# Patient Record
Sex: Male | Born: 1935 | Race: White | Hispanic: No | Marital: Single | Smoking: Current every day smoker
Health system: Southern US, Community
[De-identification: ages and names within clinical notes are randomized; demographics above are authoritative.]

## PROBLEM LIST (undated history)

## (undated) DIAGNOSIS — I2510 Atherosclerotic heart disease of native coronary artery without angina pectoris: Secondary | ICD-10-CM

## (undated) DIAGNOSIS — R0602 Shortness of breath: Secondary | ICD-10-CM

## (undated) DIAGNOSIS — I4891 Unspecified atrial fibrillation: Secondary | ICD-10-CM

## (undated) DIAGNOSIS — I493 Ventricular premature depolarization: Secondary | ICD-10-CM

## (undated) DIAGNOSIS — G459 Transient cerebral ischemic attack, unspecified: Secondary | ICD-10-CM

## (undated) DIAGNOSIS — E785 Hyperlipidemia, unspecified: Secondary | ICD-10-CM

## (undated) DIAGNOSIS — N189 Chronic kidney disease, unspecified: Secondary | ICD-10-CM

## (undated) DIAGNOSIS — Z9981 Dependence on supplemental oxygen: Secondary | ICD-10-CM

## (undated) DIAGNOSIS — Z789 Other specified health status: Secondary | ICD-10-CM

## (undated) DIAGNOSIS — I255 Ischemic cardiomyopathy: Secondary | ICD-10-CM

## (undated) DIAGNOSIS — I10 Essential (primary) hypertension: Secondary | ICD-10-CM

## (undated) DIAGNOSIS — I5030 Unspecified diastolic (congestive) heart failure: Secondary | ICD-10-CM

## (undated) HISTORY — DX: Other specified health status: Z78.9

## (undated) HISTORY — PX: CHOLECYSTECTOMY: SHX55

## (undated) HISTORY — DX: Atherosclerotic heart disease of native coronary artery without angina pectoris: I25.10

## (undated) HISTORY — DX: Transient cerebral ischemic attack, unspecified: G45.9

## (undated) HISTORY — DX: Essential (primary) hypertension: I10

## (undated) HISTORY — PX: TONSILLECTOMY: SUR1361

## (undated) HISTORY — DX: Unspecified atrial fibrillation: I48.91

## (undated) HISTORY — DX: Ventricular premature depolarization: I49.3

## (undated) HISTORY — DX: Unspecified diastolic (congestive) heart failure: I50.30

## (undated) HISTORY — DX: Ischemic cardiomyopathy: I25.5

## (undated) HISTORY — DX: Chronic kidney disease, unspecified: N18.9

## (undated) HISTORY — DX: Hyperlipidemia, unspecified: E78.5

---

## 2010-11-03 ENCOUNTER — Observation Stay (HOSPITAL_COMMUNITY)
Admission: EM | Admit: 2010-11-03 | Discharge: 2010-11-05 | Payer: Self-pay | Source: Home / Self Care | Attending: Internal Medicine | Admitting: Internal Medicine

## 2010-11-04 ENCOUNTER — Encounter (INDEPENDENT_AMBULATORY_CARE_PROVIDER_SITE_OTHER): Payer: Self-pay | Admitting: Internal Medicine

## 2010-11-05 ENCOUNTER — Encounter: Payer: Self-pay | Admitting: Internal Medicine

## 2010-11-12 ENCOUNTER — Telehealth (INDEPENDENT_AMBULATORY_CARE_PROVIDER_SITE_OTHER): Payer: Self-pay | Admitting: Radiology

## 2010-11-15 ENCOUNTER — Ambulatory Visit: Payer: Self-pay

## 2010-11-15 ENCOUNTER — Ambulatory Visit: Payer: Self-pay | Admitting: Internal Medicine

## 2010-11-15 ENCOUNTER — Encounter: Payer: Self-pay | Admitting: Cardiology

## 2010-11-15 ENCOUNTER — Encounter (HOSPITAL_COMMUNITY)
Admission: RE | Admit: 2010-11-15 | Discharge: 2010-12-28 | Payer: Self-pay | Source: Home / Self Care | Attending: Internal Medicine | Admitting: Internal Medicine

## 2010-11-25 ENCOUNTER — Encounter: Payer: Self-pay | Admitting: Internal Medicine

## 2010-12-14 ENCOUNTER — Ambulatory Visit: Admit: 2010-12-14 | Payer: Self-pay | Admitting: Internal Medicine

## 2010-12-30 NOTE — Progress Notes (Signed)
Summary: Nuclear Pre-Procedure  Phone Note Outgoing Call Call back at [facility] Phone [phone]   Call placed by: Stanton Kidney, EMT-P,  November 12, 2010 9:14 AM Call placed to: Patient Action Taken: Phone Call Completed Reason for Call: Confirm/change Appt Summary of Call: Reviewed information on Myoview Information Sheet (see scanned document for further details).  Spoke with the Patient. Stanton Kidney, EMT-P  November 12, 2010 9:14 AM      Nuclear Med Background Indications for Stress Test: Evaluation for Ischemia, Post Hospital  Indications Comments: 11/05/10 post hosp TIA ; neg. cardiac enzymes  History: Echo, Emphysema  History Comments: Echo - inf. wall abnormality / EF = 40-45%  Symptoms: Dizziness, Near Syncope    Nuclear Pre-Procedure Cardiac Risk Factors: Hypertension, TIA

## 2010-12-30 NOTE — Assessment & Plan Note (Addendum)
Summary: Cardiology Nuclear Testing  Nuclear Med Background Indications for Stress Test: Evaluation for Ischemia, Post Hospital  Indications Comments: 11/05/10 post hosp TIA ; neg. cardiac enzymes  History: Echo, Emphysema  History Comments: Echo - inf. wall abnormality / EF = 40-45%  Symptoms: Dizziness, Near Syncope    Nuclear Pre-Procedure Cardiac Risk Factors: Hypertension, TIA Caffeine/Decaff Intake: none NPO After: 7:00 AM Lungs: clear IV 0.9% NS with Angio Cath: 22g     IV Site: R Hand IV Started by: Cathlyn Parsons, RN Chest Size (in) 42     Height (in): 68 Weight (lb): 174 BMI: 26.55 Tech Comments: Toprol held x 30hrs.  Nuclear Med Study 1 or 2 day study:  1 day     Stress Test Type:  Treadmill/Lexiscan Reading MD:  Marca Ancona, MD     Referring MD:  Graciela Husbands Resting Radionuclide:  Technetium 23m Tetrofosmin     Resting Radionuclide Dose:  10.2 mCi  Stress Radionuclide:  Technetium 15m Tetrofosmin     Stress Radionuclide Dose:  30.5 mCi   Stress Protocol   Lexiscan: 0.4 mg   Stress Test Technologist:  Frederick Peers, EMT-P     Nuclear Technologist:  Doyne Keel, CNMT  Rest Procedure  Myocardial perfusion imaging was performed at rest 45 minutes following the intravenous administration of Technetium 19m Tetrofosmin.  Stress Procedure  The patient received IV Lexiscan 0.4 mg over 15-seconds with concurrent low level exercise and then Technetium 35m Tetrofosmin was injected at 30-seconds while the patient continued walking one more minute.  There were non specific ST changes/occ  PVC throughout  with Lexiscan.  Quantitative spect images were obtained after a 45 minute delay.  QPS Raw Data Images:  Normal; no motion artifact; normal heart/lung ratio. Stress Images:  Moderate-sized, severe basal to mid inferior and inferolateral and apical lateral perfusion defect.  Rest Images:  Similar to stress but less severe toward the apex.  Subtraction (SDS):  Primarily  fixed basal to mid inferior/inferolateral and apical lateral perfusion defect.  Transient Ischemic Dilatation:  1.10  (Normal <1.22)  Lung/Heart Ratio:  0.36  (Normal <0.45)  Quantitative Gated Spect Images QGS EDV:  143 ml QGS ESV:  78 ml QGS EF:  45 % QGS cine images:  Inferior and inferolateral perfusion defect.    Overall Impression  Exercise Capacity: Lexiscan with low level exercise BP Response: Mildly hypertensive BP response.  Clinical Symptoms: Short of breath.  ECG Impression: Insignificant upsloping ST segment depression. Overall Impression: Inferior and inferolateral infarct with some peri-infarct ischemia.  Overall Impression Comments: Mild systolic dysfunction with inferior and inferolateral hypokinesis.   Appended Document: Cardiology Nuclear Testing pip that this was expected and will review ti at his office visit  Appended Document: Cardiology Nuclear Testing pip that this was expectedt and will review at this office visit  Appended Document: Cardiology Nuclear Testing left msg that MD to discuss results in depth at 1/17 appointment and results were as expected. Claris Gladden, RN, BSN    Appended Document: Cardiology Nuclear Testing UNABLE TO LEAVE MESSAGE WILL TRY AGAIN LATER./CY  Appended Document: Cardiology Nuclear Testing ATTEMPTED TO CALL PT X3  PT KEEPS HANGING UP CANNOT HERE ME ON THE RECIEVING END./CY  Appended Document: Cardiology Nuclear Testing F/U APPT MADE WITH DR Graciela Husbands FOR 02/16/11 AT 10:00 AM PT AWARE./CY

## 2011-01-05 NOTE — Procedures (Signed)
Summary: Summary Report  Summary Report   Imported By: Erle Crocker 12/29/2010 14:19:55  _____________________________________________________________________  External Attachment:    Type:   Image     Comment:   External Document

## 2011-01-13 NOTE — Procedures (Signed)
Summary: Summary Report  Summary Report   Imported By: Erle Crocker 01/06/2011 15:39:57  _____________________________________________________________________  External Attachment:    Type:   Image     Comment:   External Document

## 2011-01-13 NOTE — Consult Note (Signed)
Summary: [facility]   [facility]   Imported By: Roderic Ovens 12/30/2010 15:47:31  _____________________________________________________________________  External Attachment:    Type:   Image     Comment:   External Document

## 2011-01-28 ENCOUNTER — Encounter (INDEPENDENT_AMBULATORY_CARE_PROVIDER_SITE_OTHER): Payer: Self-pay | Admitting: *Deleted

## 2011-02-03 NOTE — Letter (Signed)
Summary: Appointment - Reschedule  [facility] [employer], Main Office  [address]   [place], [place] [address]   Phone: [phone]  Fax: [phone]     January 28, 2011 MRN: [id number]   Keith Larson [address] [place], [place]  [address]   Dear [name],   Due to a change in our office schedule, your appointment on   02-16-2011                    at   10:00 A.M.              must be changed.  It is very important that we reach you to reschedule this appointment. We look forward to participating in your health care needs. Please contact us at the number listed above at your earliest convenience to reschedule this appointment.     Sincerely,     Lorne Skeens  [facility] Scheduling Team

## 2011-02-08 LAB — DIFFERENTIAL
Basophils Absolute: 0 K/uL (ref 0.0–0.1)
Basophils Relative: 0 % (ref 0–1)
Eosinophils Absolute: 0 K/uL (ref 0.0–0.7)
Eosinophils Relative: 0 % (ref 0–5)
Lymphocytes Relative: 18 % (ref 12–46)
Lymphs Abs: 1.5 K/uL (ref 0.7–4.0)
Monocytes Absolute: 0.5 K/uL (ref 0.1–1.0)
Monocytes Relative: 6 % (ref 3–12)
Neutro Abs: 6.3 K/uL (ref 1.7–7.7)
Neutrophils Relative %: 76 % (ref 43–77)

## 2011-02-08 LAB — LIPID PANEL
HDL: 35 mg/dL — ABNORMAL LOW
Total CHOL/HDL Ratio: 3.8 ratio
Triglycerides: 42 mg/dL
VLDL: 8 mg/dL (ref 0–40)

## 2011-02-08 LAB — CBC
HCT: 44.5 % (ref 39.0–52.0)
HCT: 47.5 % (ref 39.0–52.0)
Hemoglobin: 14.5 g/dL (ref 13.0–17.0)
Hemoglobin: 15.5 g/dL (ref 13.0–17.0)
MCH: 29.6 pg (ref 26.0–34.0)
MCH: 29.7 pg (ref 26.0–34.0)
MCHC: 32.6 g/dL (ref 30.0–36.0)
MCV: 90.8 fL (ref 78.0–100.0)
MCV: 91 fL (ref 78.0–100.0)
Platelets: 178 K/uL (ref 150–400)
RBC: 4.9 MIL/uL (ref 4.22–5.81)
RBC: 5.22 MIL/uL (ref 4.22–5.81)
RDW: 14.1 % (ref 11.5–15.5)
RDW: 14.2 % (ref 11.5–15.5)
WBC: 5.9 K/uL (ref 4.0–10.5)
WBC: 8.3 10*3/uL (ref 4.0–10.5)

## 2011-02-08 LAB — APTT: aPTT: 30 s (ref 24–37)

## 2011-02-08 LAB — COMPREHENSIVE METABOLIC PANEL
AST: 17 U/L (ref 0–37)
Albumin: 3.7 g/dL (ref 3.5–5.2)
Alkaline Phosphatase: 44 U/L (ref 39–117)
CO2: 29 mEq/L (ref 19–32)
Potassium: 4.8 mEq/L (ref 3.5–5.1)
Total Protein: 6.2 g/dL (ref 6.0–8.3)

## 2011-02-08 LAB — CK TOTAL AND CKMB (NOT AT ARMC): CK, MB: 2.9 ng/mL (ref 0.3–4.0)

## 2011-02-08 LAB — POCT CARDIAC MARKERS
CKMB, poc: 1.9 ng/mL (ref 1.0–8.0)
Myoglobin, poc: 108 ng/mL (ref 12–200)
Troponin i, poc: <0.05 ng/mL (ref 0.00–0.09)

## 2011-02-08 LAB — PROTIME-INR
INR: 1.05 (ref 0.00–1.49)
Prothrombin Time: 13.9 s (ref 11.6–15.2)

## 2011-02-08 LAB — POCT I-STAT, CHEM 8
BUN: 25 mg/dL — ABNORMAL HIGH (ref 6–23)
Calcium, Ion: 1.15 mmol/L (ref 1.12–1.32)
Chloride: 106 meq/L (ref 96–112)
Creatinine, Ser: 1.3 mg/dL (ref 0.4–1.5)
Glucose, Bld: 145 mg/dL — ABNORMAL HIGH (ref 70–99)
HCT: 48 % (ref 39.0–52.0)
Hemoglobin: 16.3 g/dL (ref 13.0–17.0)
Potassium: 4.7 meq/L (ref 3.5–5.1)
Sodium: 141 meq/L (ref 135–145)
TCO2: 28 mmol/L (ref 0–100)

## 2011-02-08 LAB — URINALYSIS, ROUTINE W REFLEX MICROSCOPIC
Bilirubin Urine: NEGATIVE
Glucose, UA: NEGATIVE mg/dL
Hgb urine dipstick: NEGATIVE
Specific Gravity, Urine: 1.021 (ref 1.005–1.030)
Urobilinogen, UA: 0.2 mg/dL (ref 0.0–1.0)

## 2011-02-08 LAB — CARDIAC PANEL(CRET KIN+CKTOT+MB+TROPI)
CK, MB: 2.5 ng/mL (ref 0.3–4.0)
Relative Index: INVALID (ref 0.0–2.5)
Total CK: 59 U/L (ref 7–232)

## 2011-02-08 LAB — MAGNESIUM: Magnesium: 2.1 mg/dL (ref 1.5–2.5)

## 2011-02-08 LAB — TSH: TSH: 3.134 u[IU]/mL (ref 0.350–4.500)

## 2011-02-08 LAB — D-DIMER, QUANTITATIVE

## 2011-02-12 ENCOUNTER — Encounter: Payer: Self-pay | Admitting: Internal Medicine

## 2011-02-16 ENCOUNTER — Ambulatory Visit: Payer: Self-pay | Admitting: Internal Medicine

## 2011-02-22 ENCOUNTER — Encounter: Payer: Self-pay | Admitting: Internal Medicine

## 2011-02-22 ENCOUNTER — Encounter: Payer: Self-pay | Admitting: *Deleted

## 2011-02-22 ENCOUNTER — Ambulatory Visit (INDEPENDENT_AMBULATORY_CARE_PROVIDER_SITE_OTHER): Payer: Medicare Other | Admitting: Internal Medicine

## 2011-02-22 DIAGNOSIS — Z01810 Encounter for preprocedural cardiovascular examination: Secondary | ICD-10-CM

## 2011-02-22 DIAGNOSIS — I4891 Unspecified atrial fibrillation: Secondary | ICD-10-CM

## 2011-02-22 DIAGNOSIS — R9430 Abnormal result of cardiovascular function study, unspecified: Secondary | ICD-10-CM

## 2011-02-22 DIAGNOSIS — R931 Abnormal findings on diagnostic imaging of heart and coronary circulation: Secondary | ICD-10-CM | POA: Insufficient documentation

## 2011-02-22 DIAGNOSIS — R9439 Abnormal result of other cardiovascular function study: Secondary | ICD-10-CM | POA: Insufficient documentation

## 2011-02-22 DIAGNOSIS — G459 Transient cerebral ischemic attack, unspecified: Secondary | ICD-10-CM | POA: Insufficient documentation

## 2011-02-22 LAB — CBC WITH DIFFERENTIAL/PLATELET
Basophils Absolute: 0 10*3/uL (ref 0.0–0.1)
Eosinophils Absolute: 0 10*3/uL (ref 0.0–0.7)
HCT: 45.8 % (ref 39.0–52.0)
Lymphs Abs: 1.3 10*3/uL (ref 0.7–4.0)
MCHC: 34.4 g/dL (ref 30.0–36.0)
MCV: 89.5 fl (ref 78.0–100.0)
Monocytes Absolute: 0.7 10*3/uL (ref 0.1–1.0)
Neutrophils Relative %: 73.3 % (ref 43.0–77.0)
Platelets: 214 10*3/uL (ref 150.0–400.0)
RDW: 15.6 % — ABNORMAL HIGH (ref 11.5–14.6)

## 2011-02-22 LAB — PROTIME-INR
INR: 1 ratio (ref 0.8–1.0)
Prothrombin Time: 10.9 s (ref 10.2–12.4)

## 2011-02-22 LAB — BASIC METABOLIC PANEL
CO2: 29 mEq/L (ref 19–32)
Chloride: 105 mEq/L (ref 96–112)
Creatinine, Ser: 1.7 mg/dL — ABNORMAL HIGH (ref 0.4–1.5)
GFR: 42.9 mL/min — ABNORMAL LOW (ref >60.00)
Glucose, Bld: 94 mg/dL (ref 70–99)
Sodium: 140 mEq/L (ref 135–145)

## 2011-02-22 MED ORDER — PRAVASTATIN SODIUM 20 MG PO TABS: 20.0000 mg | ORAL_TABLET | Freq: Every day | ORAL | Status: DC

## 2011-02-22 NOTE — Assessment & Plan Note (Signed)
As above.

## 2011-02-22 NOTE — Progress Notes (Signed)
  HPI  Keith Larson is a 77 y.o. male Seen following hospitalization in December when he presented with a transient ischemic episode. Evaluation at that time demonstrated modest depression of LV systolic function with an EF of 40-50%. He underwent outpatient Myoview scanning demonstrated a perfusion defect consistent with prior Myocardial infarction. He has not been seen since  December.  He also underwent 30 day event recorder demonstrated atrial fibrillation  Past Medical History  Diagnosis Date  . Emphysema   . Hypertension   . Transient ischemic attack   . Echocardiogram abnormal     abnormal ejection fraction as per echo 75f 40-50%    Past Surgical History  Procedure Date  . Cholecystectomy     Current Outpatient Prescriptions  Medication Sig Dispense Refill  . aspirin 325 MG tablet Take 325 mg by mouth daily.        [name] lisinopril (PRINIVIL,ZESTRIL) 10 MG tablet Take 10 mg by mouth daily.        . metoprolol succinate (TOPROL-XL) 25 MG 24 hr tablet Take 25 mg by mouth daily.          No Known Allergies  Review of Systems negative except from HPI and PMH  Physical Exam Well developed and well nourished elderly caucasian male apopearring his stated age in no acute distress HENT normal E scleral and icterus clear Neck Supple JVP flat; carotids brisk and full Clear to ausculation Regular rate and rhythm, no murmurs gallops or rub Soft with active bowel sounds No clubbing cyanosis and edema Alert and oriented, grossly normal motor and sensory function Skin Warm and Dry  ECG  Sinus rhythm at 62 Intervals 0.17/0.08/0.41 Axis 78 O/w  Normal   Assessment and  Plan

## 2011-02-22 NOTE — Assessment & Plan Note (Signed)
Patient's stress test demonstrates the inferior perfusion defect. It becomes essential to know whether he has coronary artery disease. Will undertake catheterization. We will also begin him on statin therapy. I have reviewed with him side effects.

## 2011-02-22 NOTE — Patient Instructions (Addendum)
Your physician has recommended you make the following change in your medication: START PRAVASTATIN 20 MG  EVERY EVENING AND START PRADAXA  AFTER CATH  Your physician has requested that you have a cardiac catheterization. Cardiac catheterization is used to diagnose and/or treat various heart conditions. Doctors may recommend this procedure for a number of different reasons. The most common reason is to evaluate chest pain. Chest pain can be a symptom of coronary artery disease (CAD), and cardiac catheterization can show whether plaque is narrowing or blocking your heart's arteries. This procedure is also used to evaluate the valves, as well as measure the blood flow and oxygen levels in different parts of your heart. For further information please visit [email or web address] Please follow instruction sheet, as given.SCHEDULED FOR 02/28/11  Your physician recommends that you return for lab work in:  TODAY BMET CBC PT  PTT  V72.81

## 2011-02-22 NOTE — Assessment & Plan Note (Signed)
The patient has atrial fibrillation detected on his event recorder. Given his prior TIA we have to assume a causative relationship. He should go on oral anticoagulation therapy. I have reviewed with him benefits and risks. We will try and get him on Pradaxa. At that time he would have his aspirin discontinued. We'll plan to wait until after his catheterization to begin this.

## 2011-02-22 NOTE — Assessment & Plan Note (Signed)
As noted his TIA resolved but its relationship to the atrial fibrillation must be assumed.

## 2011-02-25 ENCOUNTER — Other Ambulatory Visit (INDEPENDENT_AMBULATORY_CARE_PROVIDER_SITE_OTHER): Payer: Medicare Other | Admitting: *Deleted

## 2011-02-25 DIAGNOSIS — Z79899 Other long term (current) drug therapy: Secondary | ICD-10-CM

## 2011-02-25 LAB — BASIC METABOLIC PANEL
CO2: 28 mEq/L (ref 19–32)
Calcium: 9.2 mg/dL (ref 8.4–10.5)
Chloride: 108 mEq/L (ref 96–112)
Creatinine, Ser: 1.8 mg/dL — ABNORMAL HIGH (ref 0.4–1.5)
Glucose, Bld: 97 mg/dL (ref 70–99)
Sodium: 141 mEq/L (ref 135–145)

## 2011-02-28 ENCOUNTER — Inpatient Hospital Stay (HOSPITAL_BASED_OUTPATIENT_CLINIC_OR_DEPARTMENT_OTHER)
Admission: RE | Admit: 2011-02-28 | Discharge: 2011-02-28 | Disposition: A | Discharge status: Home / Self Care | Payer: Medicare Other | Source: Ambulatory Visit | Attending: Cardiovascular Disease | Admitting: Cardiovascular Disease

## 2011-02-28 DIAGNOSIS — R9439 Abnormal result of other cardiovascular function study: Secondary | ICD-10-CM | POA: Insufficient documentation

## 2011-02-28 DIAGNOSIS — I2510 Atherosclerotic heart disease of native coronary artery without angina pectoris: Secondary | ICD-10-CM | POA: Insufficient documentation

## 2011-02-28 DIAGNOSIS — I2582 Chronic total occlusion of coronary artery: Secondary | ICD-10-CM | POA: Insufficient documentation

## 2011-02-28 DIAGNOSIS — N289 Disorder of kidney and ureter, unspecified: Secondary | ICD-10-CM | POA: Insufficient documentation

## 2011-02-28 DIAGNOSIS — I4891 Unspecified atrial fibrillation: Secondary | ICD-10-CM | POA: Insufficient documentation

## 2011-03-08 ENCOUNTER — Observation Stay (HOSPITAL_COMMUNITY)
Admission: RE | Admit: 2011-03-08 | Discharge: 2011-03-09 | Disposition: A | Discharge status: Home / Self Care | Payer: Medicare Other | Source: Ambulatory Visit | Attending: Cardiovascular Disease | Admitting: Cardiovascular Disease

## 2011-03-08 DIAGNOSIS — Z7901 Long term (current) use of anticoagulants: Secondary | ICD-10-CM | POA: Insufficient documentation

## 2011-03-08 DIAGNOSIS — Z01812 Encounter for preprocedural laboratory examination: Secondary | ICD-10-CM | POA: Insufficient documentation

## 2011-03-08 DIAGNOSIS — I10 Essential (primary) hypertension: Secondary | ICD-10-CM | POA: Insufficient documentation

## 2011-03-08 DIAGNOSIS — Z79899 Other long term (current) drug therapy: Secondary | ICD-10-CM | POA: Insufficient documentation

## 2011-03-08 DIAGNOSIS — I2510 Atherosclerotic heart disease of native coronary artery without angina pectoris: Principal | ICD-10-CM | POA: Insufficient documentation

## 2011-03-08 DIAGNOSIS — Z01810 Encounter for preprocedural cardiovascular examination: Secondary | ICD-10-CM | POA: Insufficient documentation

## 2011-03-08 DIAGNOSIS — I2589 Other forms of chronic ischemic heart disease: Secondary | ICD-10-CM | POA: Insufficient documentation

## 2011-03-08 DIAGNOSIS — Z8673 Personal history of transient ischemic attack (TIA), and cerebral infarction without residual deficits: Secondary | ICD-10-CM | POA: Insufficient documentation

## 2011-03-08 DIAGNOSIS — I4891 Unspecified atrial fibrillation: Secondary | ICD-10-CM | POA: Insufficient documentation

## 2011-03-08 LAB — BASIC METABOLIC PANEL
Calcium: 9.5 mg/dL (ref 8.4–10.5)
GFR calc Af Amer: 56 mL/min — ABNORMAL LOW (ref >60)
GFR calc non Af Amer: 46 mL/min — ABNORMAL LOW (ref >60)
Sodium: 140 mEq/L (ref 135–145)

## 2011-03-08 LAB — CBC
Hemoglobin: 16.3 g/dL (ref 13.0–17.0)
MCH: 30.2 pg (ref 26.0–34.0)
MCHC: 33.6 g/dL (ref 30.0–36.0)
MCV: 90 fL (ref 78.0–100.0)

## 2011-03-08 LAB — PROTIME-INR: Prothrombin Time: 13.1 seconds (ref 11.6–15.2)

## 2011-03-09 LAB — CBC
MCH: 29.2 pg (ref 26.0–34.0)
Platelets: 187 10*3/uL (ref 150–400)
RBC: 4.83 MIL/uL (ref 4.22–5.81)
RDW: 14.8 % (ref 11.5–15.5)
WBC: 8.6 10*3/uL (ref 4.0–10.5)

## 2011-03-09 LAB — BASIC METABOLIC PANEL
Chloride: 107 mEq/L (ref 96–112)
Creatinine, Ser: 1.5 mg/dL (ref 0.4–1.5)
GFR calc Af Amer: 55 mL/min — ABNORMAL LOW (ref >60)
GFR calc non Af Amer: 45 mL/min — ABNORMAL LOW (ref >60)
Potassium: 4.4 mEq/L (ref 3.5–5.1)

## 2011-03-14 ENCOUNTER — Ambulatory Visit (INDEPENDENT_AMBULATORY_CARE_PROVIDER_SITE_OTHER): Payer: Medicare Other | Admitting: *Deleted

## 2011-03-14 DIAGNOSIS — Z7901 Long term (current) use of anticoagulants: Secondary | ICD-10-CM | POA: Insufficient documentation

## 2011-03-14 DIAGNOSIS — I4891 Unspecified atrial fibrillation: Secondary | ICD-10-CM

## 2011-03-14 LAB — POCT INR: INR: 1.6

## 2011-03-21 ENCOUNTER — Ambulatory Visit (INDEPENDENT_AMBULATORY_CARE_PROVIDER_SITE_OTHER): Payer: Medicare Other | Admitting: *Deleted

## 2011-03-21 DIAGNOSIS — Z7901 Long term (current) use of anticoagulants: Secondary | ICD-10-CM

## 2011-03-21 DIAGNOSIS — I4891 Unspecified atrial fibrillation: Secondary | ICD-10-CM

## 2011-03-21 LAB — POCT INR: INR: 2.5

## 2011-03-28 ENCOUNTER — Ambulatory Visit (INDEPENDENT_AMBULATORY_CARE_PROVIDER_SITE_OTHER): Payer: Medicare Other | Admitting: *Deleted

## 2011-03-28 DIAGNOSIS — I4891 Unspecified atrial fibrillation: Secondary | ICD-10-CM

## 2011-03-28 DIAGNOSIS — Z7901 Long term (current) use of anticoagulants: Secondary | ICD-10-CM

## 2011-03-31 NOTE — Discharge Summary (Signed)
NAME:  Keith Larson, DOLLAR NO.:  [id number]  MEDICAL RECORD NO.:  [id number]           PATIENT TYPE:  O  LOCATION:  6526                         FACILITY:  [facility]  PHYSICIAN:  Veverly Fells. Excell Seltzer, MD  DATE OF BIRTH:  [birth year]  DATE OF ADMISSION:  03/08/2011 DATE OF DISCHARGE:  03/09/2011                              DISCHARGE SUMMARY   PRIMARY CARDIOLOGIST:  Duke Salvia, MD, [facility].  DISCHARGE DIAGNOSES: 1. Coronary artery disease status post percutaneous intervention of     the left circumflex artery using a MultiLink Vision bare-metal     stent.     a.     Nonobstructive disease, left main, right coronary artery,      and left anterior descending artery. 2. Paroxysmal atrial fibrillation, anticoagulation with Coumadin     initiated at this admission. 3. History of transient ischemic attack. 4. Hypertension.  SECONDARY DIAGNOSIS:  Emphysema.  PROCEDURES/DIAGNOSTICS PERFORMED DURING HOSPITALIZATION: 1. Percutaneous transluminal coronary angioplasty and stenting of the     left circumflex.     a.     Bare-metal stent:  MultiLink Vision, 2.75 mm x 23 mm.  REASON FOR HOSPITALIZATION:  This is a 77 year old gentleman with the above-stated problem list, who underwent a Myoview stress test, demonstrating inferolateral ischemia and therefore was referred for cardiac catheterization.  Cardiac catheterization did demonstrate critical left circumflex stenosis and therefore staged PCI was planned.  HOSPITAL COURSE:  The patient was brought to the cardiac cath lab by Dr. Excell Seltzer on March 08, 2011.  Informed consent was obtained.  As above, Dr. Excell Seltzer completed successful PCI of the left circumflex with a bare-metal stent.  It was noted that the patient does require long-term anticoagulation, the patient was initially on Pradaxa, but was needing aspirin and Plavix for at least 30 days.  It was decided by Dr. Graciela Husbands that the patient will be started on Coumadin and  Pradaxa will be discontinued at this time.  The patient tolerated the procedure well and was taken for overnight observation.  The patient's right groin was without signs of hematoma.  The patient's EKG while in the hospital demonstrated sinus bradycardia at a rate of 58 beats per minute.  There was no acute changes.  On day of discharge, Dr. Excell Seltzer evaluated the patient and noted him stable for home.  He was able to ambulate without difficulty.  Change of medication was discussed with the patient and he voiced understanding.  DISCHARGE LABS:  WBC 8.6, hemoglobin 14.1, hematocrit 43.3, platelet 187.  Sodium 141, potassium 4.4, BUN 21, creatinine 1.5.  DISCHARGE MEDICATIONS: 1. Coumadin 4 mg 1 tablet daily. 2. Aspirin enteric-coated 81 mg daily. 3. Famotidine 10 mg daily. 4. Lisinopril 10 mg daily. 5. Metoprolol succinate 25 mg 1 tablet daily. 6. Plavix 75 mg daily. 7. Pravastatin 20 mg daily.  FOLLOWUP PLANS AND INSTRUCTIONS: 1. The patient will follow up in the [facility] on     Monday, April 16, at 7:30 a.m. 2. The patient will follow up with Dr. Graciela Husbands on May 8 at 4:15. 3. The patient is to increase  his activity slowly.  He may shower, but     no bathing.  He is not to lift anything for 1 week greater than 5     pounds.  He is not to drive for 2 days.  He is not to have sexual     activity for 1 week.  He is to keep his cath site clean and dry and     call the office for any problems. 4. The patient is to continue low-sodium heart-healthy diet. 5. The patient is to stop any activity that causes chest pain or     shortness of breath.  DURATION OF DISCHARGE:  Greater than 30 minutes with physician and physician extender time.     Leonette Monarch, PA-C   ______________________________ Veverly Fells. Excell Seltzer, MD    NB/MEDQ  D:  03/09/2011  T:  03/09/2011  Job:  [id number]  cc:   Duke Salvia, MD, [facility]  Electronically Signed by Alen Blew P.A. on  03/10/2011 02:19:43 PM Electronically Signed by Tonny Bollman MD on 03/31/2011 10:24:46 AM

## 2011-03-31 NOTE — Cardiovascular Report (Signed)
NAME:  Keith Larson, Keith Larson NO.:  [id number]  MEDICAL RECORD NO.:  [id number]          PATIENT TYPE:  LOCATION:                                 FACILITY:  PHYSICIAN:  Veverly Fells. Excell Seltzer, MD  DATE OF BIRTH:  1932/06/15  DATE OF PROCEDURE: DATE OF DISCHARGE:                           CARDIAC CATHETERIZATION   PROCEDURES: 1. Left heart catheterization. 2. Selective coronary angiography.  PROCEDURAL INDICATION:  Keith Larson is a 77 year old gentleman who recently was seen by Dr. Graciela Larson for hospital followup after he sustained a TIA in December 2011.  The patient has had atrial fibrillation.  He was found to have segmental left ventricular dysfunction with an ejection fraction of 45% with inferior hypokinesis.  His Myoview scan showed an inferior perfusion defect.  He was referred for cardiac catheterization.  Risks and indications of the procedure were reviewed with the patient. Informed consent was obtained.  The right groin was prepped, draped and anesthetized with 1% lidocaine using a modified Seldinger technique.  A 4-French sheath was placed in the right femoral artery.  Standard Judkins catheters were used for coronary angiography.  Left ventriculography was deferred because of renal insufficiency.  The patient's creatinine clearance is 42.  I had difficulty with fitting in a Judkins left catheter into the left coronary artery.  We tried a JL-4, JL-5, and JL-6.  I was able to position an AL-2 into the left main without too much difficulty.  A 3-D RCA was used for the right coronary artery.  Catheter exchanges were performed over a guidewire.  Left ventricular pressure was recorded with a 3-DRC catheter and a pullback across the aortic valve was done.  The patient tolerated the procedure well.  There were no immediate complications.  PROCEDURAL FINDINGS:  Aortic pressure 112/49 with a mean of 75, left ventricular pressure 115/15.  CORONARY ANGIOGRAPHY:  The left  mainstem is patent.  There is minor nonobstructive plaque at the distal left main divides into the LAD and left circumflex.  LAD:  The LAD is patent.  There is mild diffuse stenosis in the proximal LAD just after the first septal perforator.  There is no significant stenosis in the mid or distal LAD.  There are 2 moderate-sized diagonal branches without significant stenosis.  There is no stenosis in the LAD worse than 25-30%.  Left circumflex.  The left circumflex is a large caliber vessel, proximally there is minor nonobstructive plaque present on the left circumflex, gives off large first OM branch that has an eccentric 90-95% stenosis present.  The AV groove circumflex occludes in the midportion and fills via right-to-left collaterals.  Right coronary artery:  The right coronary artery has diffuse nonobstructive plaque with 20-30% stenoses.  It gives off a PDA and a large posterolateral branch and also supplies right-to-left collaterals to the distal left circumflex territory.  FINAL ASSESSMENT: 1. Severe obtuse marginal stenosis with total occlusion of the     atrioventricular groove circumflex beyond the obtuse marginal     origin. 2. Nonobstructive disease in the left main, right coronary artery, and     left anterior  descending.  RECOMMENDATIONS:  The patient has severe focal stenosis in the left circumflex leading into the OM branch.  This affects fairly large amount of myocardium and I would recommend percutaneous intervention.  I reviewed the patient's situation with Dr. Graciela Larson and we will plan on staged PCI next week because of his renal insufficiency with low creatinine clearance.  We will plan on treating him with a bare-metal stent and then starting him on Pradaxa after the procedure.     Veverly Fells. Excell Seltzer, MD     MDC/MEDQ  D:  02/28/2011  T:  02/28/2011  Job:  [id number]  cc:   Keith Salvia, MD, [facility]  Electronically Signed by Tonny Bollman MD on  03/31/2011 10:24:39 AM

## 2011-03-31 NOTE — Cardiovascular Report (Signed)
  NAME:  Keith Larson, Keith Larson NO.:  [id number]  MEDICAL RECORD NO.:  [id number]           PATIENT TYPE:  O  LOCATION:  6526                         FACILITY:  [facility]  PHYSICIAN:  Veverly Fells. Excell Seltzer, MD  DATE OF BIRTH:  [birth year]  DATE OF PROCEDURE:  03/08/2011 DATE OF DISCHARGE:                           CARDIAC CATHETERIZATION   PROCEDURE:  Percutaneous transluminal coronary angioplasty and stenting of left circumflex.  PROCEDURAL INDICATIONS:  This is 77 year old gentleman with mild ischemic cardiomyopathy.  He had a Myoview scan demonstrating inferolateral ischemia and was referred for cardiac cath.  This demonstrated critical left circumflex stenosis and he was referred today for PCI.  I planned on using a bare-metal stent for his left circumflex lesion because he requires long-term anticoagulation with Pradaxa.  Risks and indications of procedure were reviewed with the patient and informed consent was obtained.  The right groin was prepped, draped and anesthetized with 1% lidocaine.  Using the modified Seldinger technique, a 6-French sheath was placed in the right femoral artery.  Bivalirudin was started for anticoagulation and XB 3.5-cm guide catheter was utilized.  Angiography confirmed critical mid left circumflex stenosis leading into the first obtuse marginal branch.  There was an eccentric 95% plaque with disease on both sides of the critical lesion.  Once the therapeutic ACT was achieved, the vessel was wired with a Cougar guidewire.  The vessel was predilated with a 2.5 x 15-mm balloon, which was taken to 6 and 8 atmospheres on subsequent inflations.  The vessel was then stented with a 2.75 x 23-mm Vision bare-metal stent.  The stent was deployed at 12 atmospheres.  The stent appeared mildly underexpanded in the midportion.  The stent was postdilated with a 3.0 x 20-mm [place] apex balloon, which was dilated to 18 atmospheres with excellent stent expansion  and 0% residual stenosis.  Final angiography demonstrated an excellent result with TIMI 3 flow.  The patient tolerated the procedure well.  A femoral angiogram at the completion of the case showed calcification at the entry site and I elected not to close the vessel with a closure device.  Manual hemostasis will be utilized.  FINAL CONCLUSION:  Successful percutaneous intervention of the left circumflex using a single bare-metal stent.  RECOMMENDATIONS:  The patient will remain on aspirin and Plavix.  His Pradaxa will be restarted tomorrow.  We will plan on limiting his Plavix to 30 days.     Veverly Fells. Excell Seltzer, MD     MDC/MEDQ  D:  03/08/2011  T:  03/08/2011  Job:  [id number]  cc:   Duke Salvia, MD, [facility]  Electronically Signed by Tonny Bollman MD on 03/31/2011 10:25:07 AM

## 2011-04-04 ENCOUNTER — Encounter: Payer: Medicare Other | Admitting: *Deleted

## 2011-04-05 ENCOUNTER — Encounter: Payer: Self-pay | Admitting: Internal Medicine

## 2011-04-05 ENCOUNTER — Ambulatory Visit (INDEPENDENT_AMBULATORY_CARE_PROVIDER_SITE_OTHER): Payer: Medicare Other | Admitting: *Deleted

## 2011-04-05 ENCOUNTER — Ambulatory Visit (INDEPENDENT_AMBULATORY_CARE_PROVIDER_SITE_OTHER): Payer: Medicare Other | Admitting: Internal Medicine

## 2011-04-05 VITALS — BP 142/62 | HR 56 | Ht 68.0 in | Wt 173.1 lb

## 2011-04-05 DIAGNOSIS — I4891 Unspecified atrial fibrillation: Secondary | ICD-10-CM

## 2011-04-05 DIAGNOSIS — Z789 Other specified health status: Secondary | ICD-10-CM | POA: Insufficient documentation

## 2011-04-05 DIAGNOSIS — Z888 Allergy status to other drugs, medicaments and biological substances status: Secondary | ICD-10-CM

## 2011-04-05 DIAGNOSIS — I2510 Atherosclerotic heart disease of native coronary artery without angina pectoris: Secondary | ICD-10-CM

## 2011-04-05 DIAGNOSIS — Z7901 Long term (current) use of anticoagulants: Secondary | ICD-10-CM

## 2011-04-05 DIAGNOSIS — T465X5A Adverse effect of other antihypertensive drugs, initial encounter: Secondary | ICD-10-CM

## 2011-04-05 DIAGNOSIS — I428 Other cardiomyopathies: Secondary | ICD-10-CM | POA: Insufficient documentation

## 2011-04-05 MED ORDER — LOSARTAN POTASSIUM 50 MG PO TABS: 50.0000 mg | ORAL_TABLET | Freq: Every day | ORAL | Status: DC

## 2011-04-05 NOTE — Progress Notes (Signed)
  HPI  Keith Larson is a 77 y.o. male wing hospitalization in December when he presented with a transient ischemic episode. Evaluation at that time demonstrated modest depression of LV systolic function with an EF of 40-50%. He underwent outpatient Myoview scanning demonstrated a perfusion defect consistent with prior Myocardial infarction.in April he underwent catheterization demonstrating obstructive and nonobstructive coronary disease as the fluid underwent stenting of his Marginal  He is complaining of Significant superficial bleeding.  He also mentions that he is having a persistent cough.  Past Medical History  Diagnosis Date  . Atrial fibrillation     Detected on an event recorder  . Abnormal stress test     Myoview 2011 EF of 40-45% fixed inferior perfusion defect  . Transient ischemic attack   . Echocardiogram abnormal     abnormal ejection fraction as per echo 39f 40-50%  . Emphysema   . PVC (premature ventricular contraction)   . Hypertension     Past Surgical History  Procedure Date  . Cholecystectomy     Current Outpatient Prescriptions  Medication Sig Dispense Refill  . aspirin 325 MG tablet Take 325 mg by mouth daily.        [name] lisinopril (PRINIVIL,ZESTRIL) 10 MG tablet Take 10 mg by mouth daily.        . metoprolol succinate (TOPROL-XL) 25 MG 24 hr tablet Take 25 mg by mouth daily.        . pravastatin (PRAVACHOL) 20 MG tablet Take 1 tablet (20 mg total) by mouth daily.  30 tablet  11  . warfarin (COUMADIN) 4 MG tablet Take by mouth as directed.          No Known Allergies  Review of Systems negative except from HPI and PMH  Physical Exam Well developed and well nourished in no acute distress HENT normal E scleral and icterus clear Neck Supple JVP flat; carotids brisk and full Clear to ausculation Regular rate and rhythm, no murmurs gallops or rub Soft with active bowel sounds No clubbing cyanosis and edema Alert and oriented, grossly normal motor and  sensory function Skin Warm and Dry; multiple superficial ecchymoses  ECG Sinus rhythm at 56 next 9 and 12.15 5.08/0.43 Occasional PAC Nonspecific ST scooping Assessment and  Plan

## 2011-04-05 NOTE — Assessment & Plan Note (Signed)
As noted above we'll discontinue aspirin

## 2011-04-05 NOTE — Assessment & Plan Note (Signed)
We'll continue his beta blockers. Because of a cough we will switch his ACE inhibitor to an ARB

## 2011-04-05 NOTE — Assessment & Plan Note (Signed)
Discontinue and begin an ARB

## 2011-04-05 NOTE — Assessment & Plan Note (Signed)
Paroxysmal atrial fibrillation; we'll continue him on Coumadin. I have reviewed with Dr. Excell Seltzer the need to maintain aspirin given his bleeding. We will plan to stop the aspirin at this time.

## 2011-04-05 NOTE — Patient Instructions (Addendum)
Your physician has recommended you make the following change in your medication: 1) Stop Aspirin. 2)Stop lisinopril. 3) Start Cozaar (losartan) 50mg  once daily.  Your physician recommends that you schedule a follow-up appointment in: 4 months.

## 2011-04-11 ENCOUNTER — Encounter: Payer: Self-pay | Admitting: Internal Medicine

## 2011-04-15 ENCOUNTER — Other Ambulatory Visit: Payer: Self-pay | Admitting: *Deleted

## 2011-04-15 ENCOUNTER — Ambulatory Visit (INDEPENDENT_AMBULATORY_CARE_PROVIDER_SITE_OTHER): Payer: Medicare Other | Admitting: *Deleted

## 2011-04-15 DIAGNOSIS — Z7901 Long term (current) use of anticoagulants: Secondary | ICD-10-CM

## 2011-04-15 DIAGNOSIS — I4891 Unspecified atrial fibrillation: Secondary | ICD-10-CM

## 2011-04-15 LAB — POCT INR: INR: 4.8

## 2011-04-15 MED ORDER — METOPROLOL SUCCINATE ER 25 MG PO TB24: 25.0000 mg | ORAL_TABLET | Freq: Every day | ORAL | Status: DC

## 2011-04-27 ENCOUNTER — Ambulatory Visit (INDEPENDENT_AMBULATORY_CARE_PROVIDER_SITE_OTHER): Payer: Medicare Other | Admitting: *Deleted

## 2011-04-27 DIAGNOSIS — I4891 Unspecified atrial fibrillation: Secondary | ICD-10-CM

## 2011-04-27 DIAGNOSIS — Z7901 Long term (current) use of anticoagulants: Secondary | ICD-10-CM

## 2011-05-10 ENCOUNTER — Ambulatory Visit: Payer: Medicare Other | Admitting: Internal Medicine

## 2011-05-10 ENCOUNTER — Encounter: Payer: Self-pay | Admitting: Internal Medicine

## 2011-05-23 ENCOUNTER — Ambulatory Visit (INDEPENDENT_AMBULATORY_CARE_PROVIDER_SITE_OTHER): Payer: Medicare Other | Admitting: *Deleted

## 2011-05-23 DIAGNOSIS — Z7901 Long term (current) use of anticoagulants: Secondary | ICD-10-CM

## 2011-05-23 DIAGNOSIS — I4891 Unspecified atrial fibrillation: Secondary | ICD-10-CM

## 2011-06-13 ENCOUNTER — Ambulatory Visit (INDEPENDENT_AMBULATORY_CARE_PROVIDER_SITE_OTHER): Payer: Medicare Other | Admitting: *Deleted

## 2011-06-13 DIAGNOSIS — I4891 Unspecified atrial fibrillation: Secondary | ICD-10-CM

## 2011-06-13 DIAGNOSIS — Z7901 Long term (current) use of anticoagulants: Secondary | ICD-10-CM

## 2011-06-27 ENCOUNTER — Ambulatory Visit (INDEPENDENT_AMBULATORY_CARE_PROVIDER_SITE_OTHER): Payer: Medicare Other | Admitting: *Deleted

## 2011-06-27 DIAGNOSIS — I4891 Unspecified atrial fibrillation: Secondary | ICD-10-CM

## 2011-06-27 DIAGNOSIS — Z7901 Long term (current) use of anticoagulants: Secondary | ICD-10-CM

## 2011-06-27 LAB — POCT INR: INR: 2.8

## 2011-07-13 ENCOUNTER — Encounter: Payer: Self-pay | Admitting: Cardiovascular Disease

## 2011-07-18 ENCOUNTER — Ambulatory Visit (INDEPENDENT_AMBULATORY_CARE_PROVIDER_SITE_OTHER): Payer: Medicare Other | Admitting: *Deleted

## 2011-07-18 DIAGNOSIS — I4891 Unspecified atrial fibrillation: Secondary | ICD-10-CM

## 2011-07-18 DIAGNOSIS — Z7901 Long term (current) use of anticoagulants: Secondary | ICD-10-CM

## 2011-07-18 LAB — POCT INR: INR: 4.1

## 2011-08-02 ENCOUNTER — Ambulatory Visit (INDEPENDENT_AMBULATORY_CARE_PROVIDER_SITE_OTHER): Payer: Medicare Other | Admitting: *Deleted

## 2011-08-02 DIAGNOSIS — Z7901 Long term (current) use of anticoagulants: Secondary | ICD-10-CM

## 2011-08-02 DIAGNOSIS — I4891 Unspecified atrial fibrillation: Secondary | ICD-10-CM

## 2011-08-02 LAB — POCT INR: INR: 2.6

## 2011-08-23 ENCOUNTER — Ambulatory Visit (INDEPENDENT_AMBULATORY_CARE_PROVIDER_SITE_OTHER): Payer: Medicare Other | Admitting: *Deleted

## 2011-08-23 ENCOUNTER — Ambulatory Visit (INDEPENDENT_AMBULATORY_CARE_PROVIDER_SITE_OTHER): Payer: Medicare Other | Admitting: Internal Medicine

## 2011-08-23 ENCOUNTER — Encounter: Payer: Self-pay | Admitting: Internal Medicine

## 2011-08-23 DIAGNOSIS — I4891 Unspecified atrial fibrillation: Secondary | ICD-10-CM

## 2011-08-23 DIAGNOSIS — I498 Other specified cardiac arrhythmias: Secondary | ICD-10-CM

## 2011-08-23 DIAGNOSIS — R001 Bradycardia, unspecified: Secondary | ICD-10-CM

## 2011-08-23 DIAGNOSIS — I428 Other cardiomyopathies: Secondary | ICD-10-CM

## 2011-08-23 DIAGNOSIS — I2510 Atherosclerotic heart disease of native coronary artery without angina pectoris: Secondary | ICD-10-CM

## 2011-08-23 DIAGNOSIS — Z7901 Long term (current) use of anticoagulants: Secondary | ICD-10-CM

## 2011-08-23 LAB — POCT INR: INR: 2.6

## 2011-08-23 MED ORDER — WARFARIN SODIUM 4 MG PO TABS: ORAL_TABLET | ORAL | Status: DC

## 2011-08-23 NOTE — Assessment & Plan Note (Signed)
Heart rate today is 48. We will discontinue his beta blocker for a month and see what his exercise tolerance improves.

## 2011-08-23 NOTE — Patient Instructions (Signed)
Your physician has recommended you make the following change in your medication:  1) Stop metoprolol for 1 month.  Call Dr. Odessa Fleming nurse, Herbert Seta, in 1 month and let us know how your heart rate and energy level are doing.  Your physician wants you to follow-up in: 6 months. You will receive a reminder letter in the mail two months in advance. If you don't receive a letter, please call our office to schedule the follow-up appointment.

## 2011-08-23 NOTE — Assessment & Plan Note (Signed)
Continue current ARB. His symptoms are not sufficient to justify Aldactone. We will hold his beta blocker and see whether his symptoms resolve; the event that they do not, we will resume

## 2011-08-23 NOTE — Progress Notes (Signed)
  HPI  Keith Larson is a 77 y.o. male Seen in followup for Afib, TIA and CAD s/p BMS His cough has resolved, he has mild SOB but no edema   Past Medical History  Diagnosis Date  . Atrial fibrillation     Detected on an event recorder  . Coronary artery disease     Status post marginal stenting ejection fraction 45%  . Transient ischemic attack   . Echocardiogram abnormal     abnormal ejection fraction as per echo 37f 40-50%  . Emphysema   . PVC (premature ventricular contraction)   . Hypertension   . ACE inhibitor intolerance     Cough    Past Surgical History  Procedure Date  . Cholecystectomy     Current Outpatient Prescriptions  Medication Sig Dispense Refill  . losartan (COZAAR) 50 MG tablet Take 1 tablet (50 mg total) by mouth daily.  30 tablet  11  . metoprolol succinate (TOPROL-XL) 25 MG 24 hr tablet Take 1 tablet (25 mg total) by mouth daily.  30 tablet  6  . Omeprazole Magnesium 20.6 (20 BASE) MG CPDR Take 1 capsule by mouth daily.        . pravastatin (PRAVACHOL) 20 MG tablet Take 1 tablet (20 mg total) by mouth daily.  30 tablet  11  . warfarin (COUMADIN) 4 MG tablet Take as directed by the coumadin clinic.  45 tablet  3    No Known Allergies  Review of Systems negative except from HPI and PMH  Physical Exam Well developed and well nourished in no acute distress HENT normal E scleral and icterus clear Neck Supple JVP flat; carotids brisk and full Clear to ausculation Regular rate and rhythm, no murmurs gallops or rub Soft with active bowel sounds No clubbing cyanosis and edema Alert and oriented, grossly normal motor and sensory function Skin Warm and Dry  ECG sinus at 48 .17/.09/.43  Assessment and  Plan

## 2011-08-23 NOTE — Assessment & Plan Note (Signed)
We'll continue current medications; I wonder whether some of his exercise intolerance is related to his sinus bradycardia. We will stop his beta blocker for a one-month trial

## 2011-08-23 NOTE — Assessment & Plan Note (Signed)
No recurrent atrial fibrillation. Continue on Coumadin

## 2011-09-20 ENCOUNTER — Ambulatory Visit (INDEPENDENT_AMBULATORY_CARE_PROVIDER_SITE_OTHER): Payer: Medicare Other | Admitting: *Deleted

## 2011-09-20 DIAGNOSIS — Z7901 Long term (current) use of anticoagulants: Secondary | ICD-10-CM

## 2011-09-20 DIAGNOSIS — I4891 Unspecified atrial fibrillation: Secondary | ICD-10-CM

## 2011-09-20 LAB — POCT INR: INR: 1.8

## 2011-10-18 ENCOUNTER — Ambulatory Visit (INDEPENDENT_AMBULATORY_CARE_PROVIDER_SITE_OTHER): Payer: Medicare Other | Admitting: *Deleted

## 2011-10-18 DIAGNOSIS — I4891 Unspecified atrial fibrillation: Secondary | ICD-10-CM

## 2011-10-18 DIAGNOSIS — Z7901 Long term (current) use of anticoagulants: Secondary | ICD-10-CM

## 2011-11-03 ENCOUNTER — Ambulatory Visit (INDEPENDENT_AMBULATORY_CARE_PROVIDER_SITE_OTHER): Payer: Medicare Other | Admitting: *Deleted

## 2011-11-03 DIAGNOSIS — Z7901 Long term (current) use of anticoagulants: Secondary | ICD-10-CM

## 2011-11-03 DIAGNOSIS — I4891 Unspecified atrial fibrillation: Secondary | ICD-10-CM

## 2011-11-03 MED ORDER — WARFARIN SODIUM 4 MG PO TABS: 4.0000 mg | ORAL_TABLET | ORAL | Status: DC

## 2012-01-24 IMAGING — CT CT HEAD W/O CM
1 series · 15 of 30 positions shown, 19 images · non-contrast
Comparison: None.

CLINICAL DATA: Syncope.  Vomiting.

CT HEAD WITHOUT CONTRAST
TECHNIQUE: Contiguous axial images were obtained from the base of
the skull through the vertex without contrast.

[Series 2: head routine 4.8 h37s · axial · 0.46mm/px · z∈[+1207,+1335]mm · 15 of 30 slices shown, 19 images]
[im 2/30  brain]
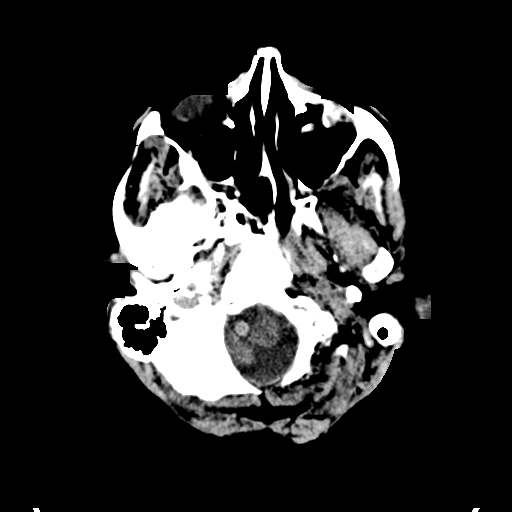
[im 2/30  bone]
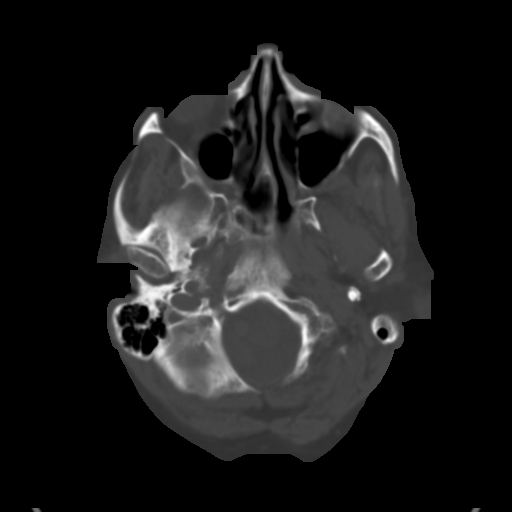
[im 4/30  brain]
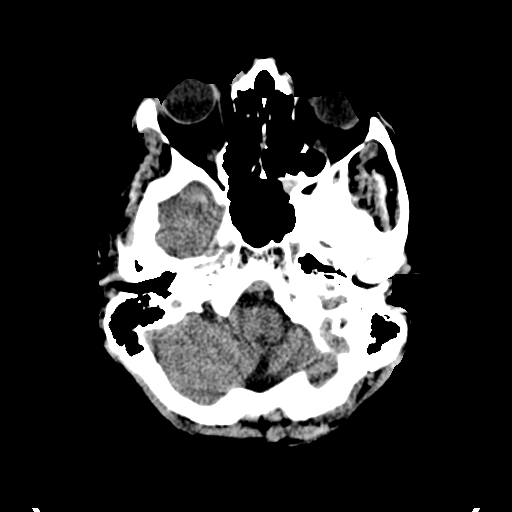
[im 6/30  brain]
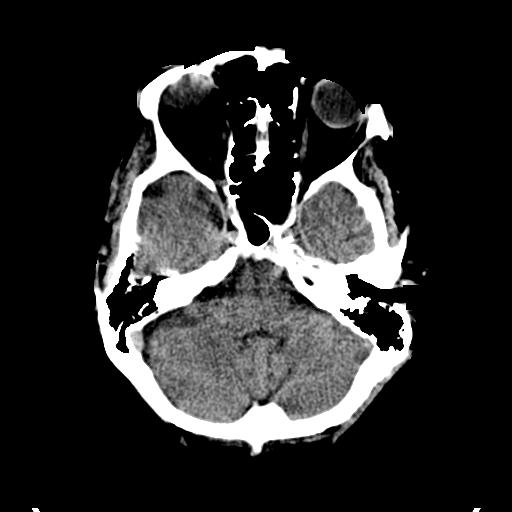
[im 8/30  brain]
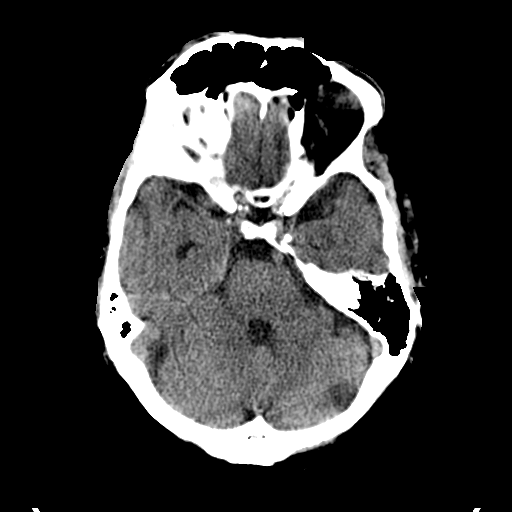
[im 10/30  brain]
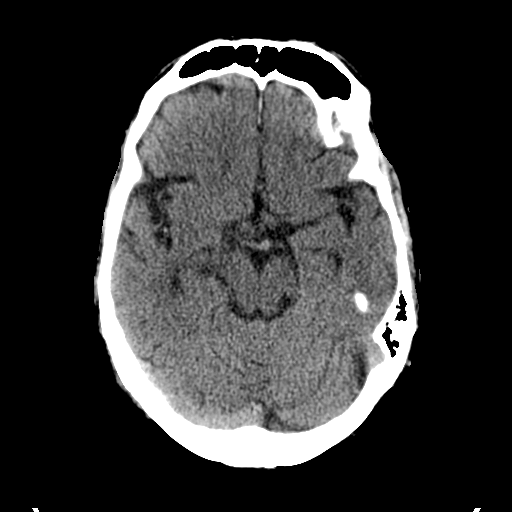
[im 10/30  bone]
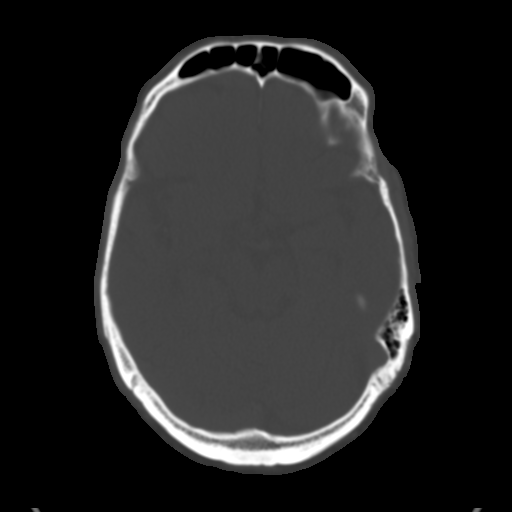
[im 12/30  brain]
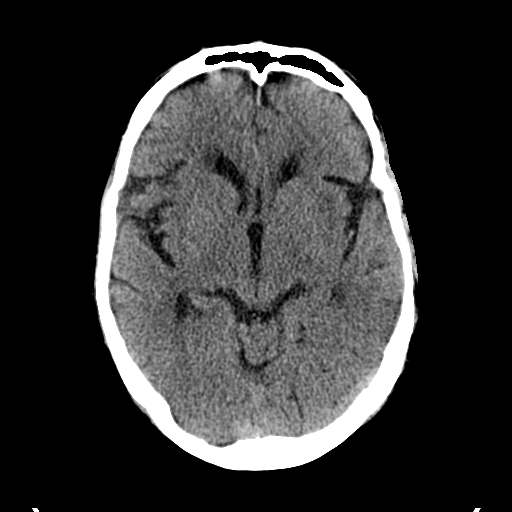
[im 14/30  brain]
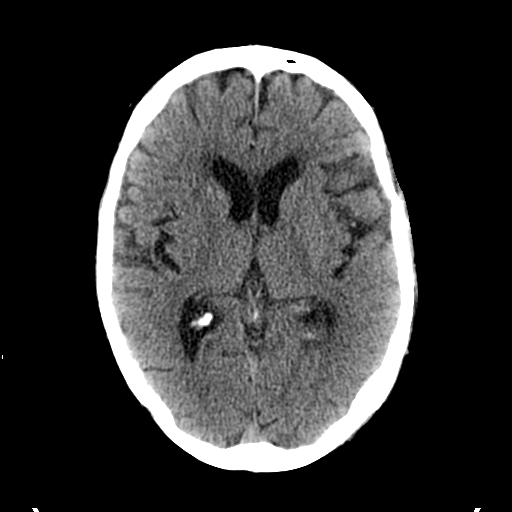
[im 16/30  brain]
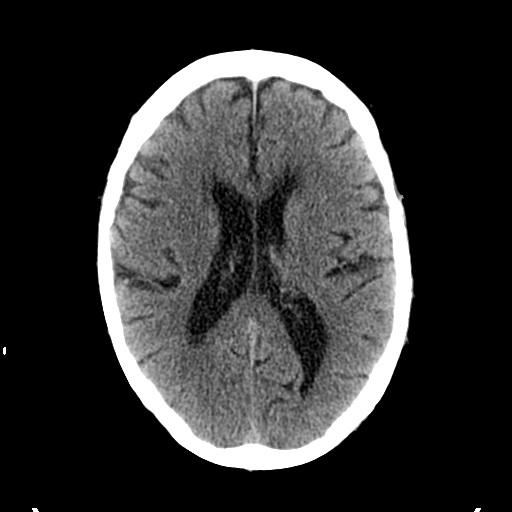
[im 17/30  brain]
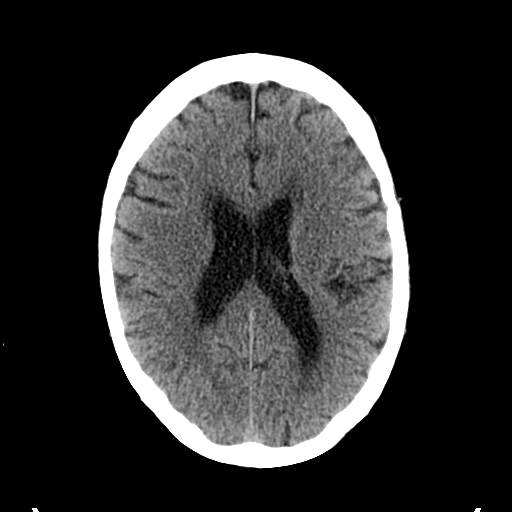
[im 17/30  bone]
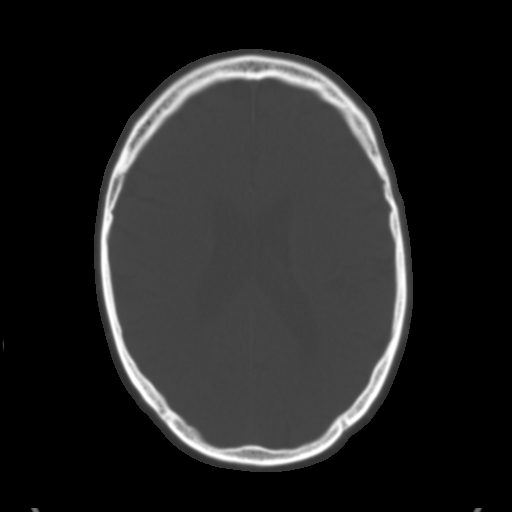
[im 19/30  brain]
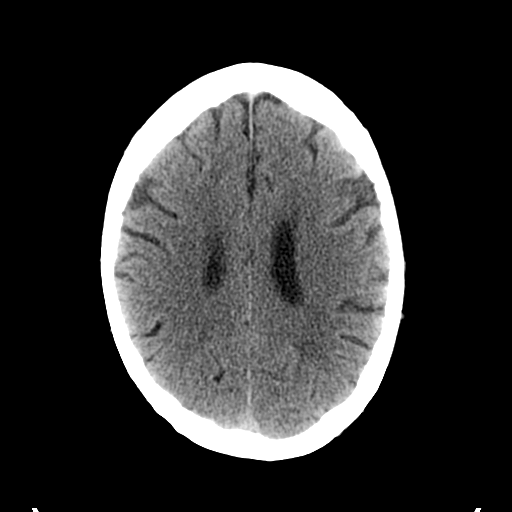
[im 21/30  brain]
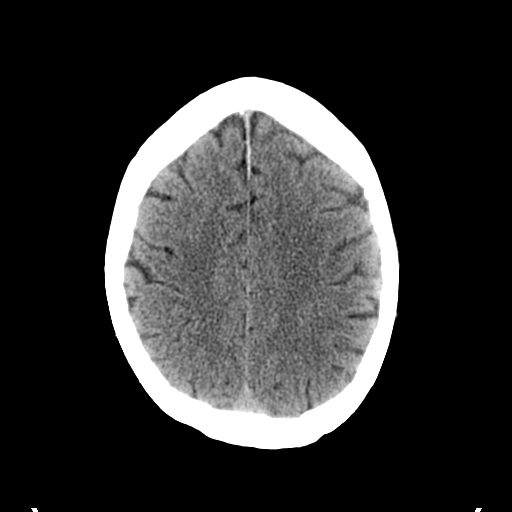
[im 23/30  brain]
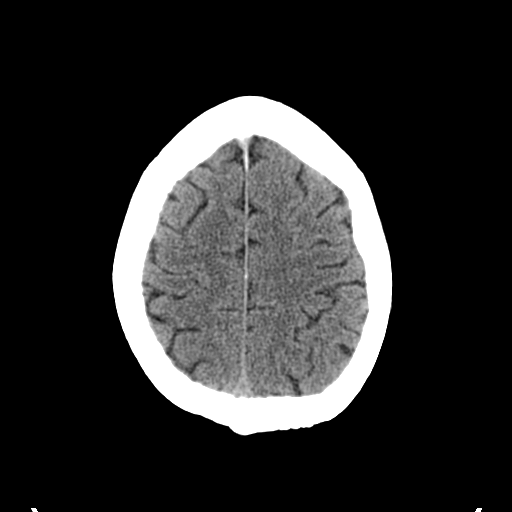
[im 25/30  brain]
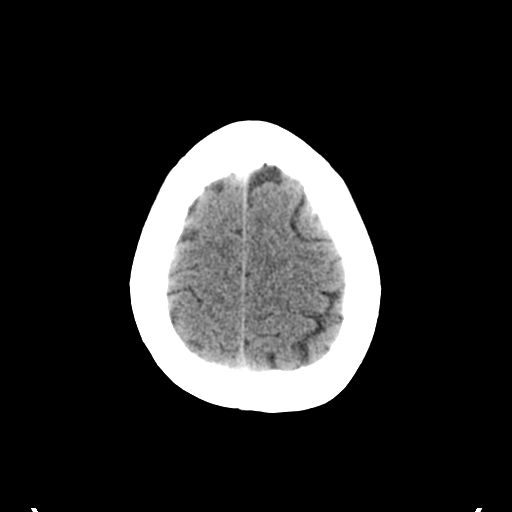
[im 25/30  bone]
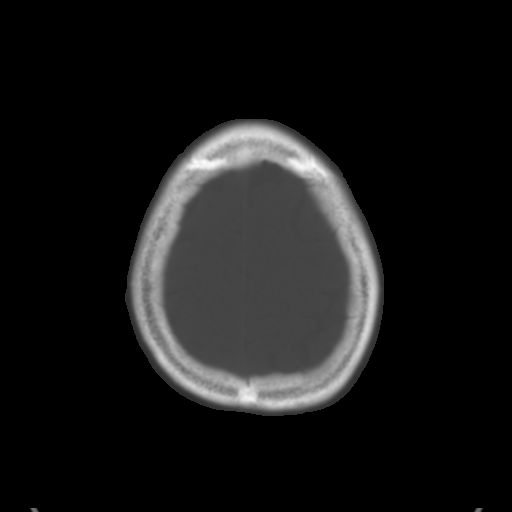
[im 27/30  brain]
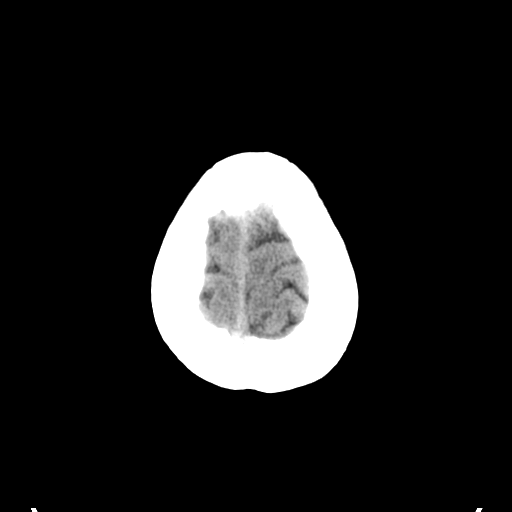
[im 29/30  brain]
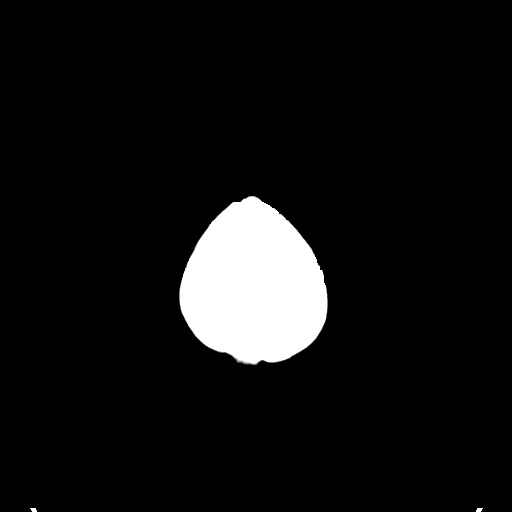

[15 of 30 positions shown; findings below may reference images not displayed]

FINDINGS: There is greater than expected density in the dominant
right vertebral artery and in the basilar artery.  I cannot
confidently exclude basilar artery thrombosis in this setting.
Correlate with posterior circulation neurologic findings in
assessing the need for neurointerventional angiography or MR
angiography.

Periventricular and corona radiata white matter hypodensities are
most compatible with chronic ischemic microvascular white matter
disease.

Otherwise, the brain stem, cerebellum, cerebral peduncles, thalami,
basal ganglia, basilar cisterns, and ventricular system appear
unremarkable.

No intracranial hemorrhage, mass lesion, or acute infarction is
identified.

Polypoid mucoperiosteal thickening is present left sphenoid sinus.
IMPRESSION: 1.  The dominant right vertebral artery and basilar artery appear
dense and could be thrombosed.  Careful neurologic exam to assess
for associated abnormalities is recommended.  If the patient has
new onset cerebellar ataxia or new onset balance difficulties, then
emergent neurointerventional assessment would be recommended.
2. Periventricular and corona radiata white matter hypodensities
are most compatible with chronic ischemic microvascular white
matter disease.
3.  Mild chronic sphenoid sinusitis.

I discussed these findings by telephone with Dr. Adejo Ishiguzo at
[DATE] a.m. on 11/04/2010.

## 2013-02-05 ENCOUNTER — Emergency Department (HOSPITAL_COMMUNITY): Payer: Medicare Other

## 2013-02-05 ENCOUNTER — Inpatient Hospital Stay (HOSPITAL_COMMUNITY)
Admission: EM | Admit: 2013-02-05 | Discharge: 2013-02-11 | DRG: 190 | Disposition: A | Discharge status: Home / Self Care | Payer: Medicare Other | Attending: Internal Medicine | Admitting: Internal Medicine

## 2013-02-05 ENCOUNTER — Encounter (HOSPITAL_COMMUNITY): Payer: Self-pay | Admitting: *Deleted

## 2013-02-05 DIAGNOSIS — Z888 Allergy status to other drugs, medicaments and biological substances status: Secondary | ICD-10-CM

## 2013-02-05 DIAGNOSIS — I4949 Other premature depolarization: Secondary | ICD-10-CM | POA: Diagnosis present

## 2013-02-05 DIAGNOSIS — Z87891 Personal history of nicotine dependence: Secondary | ICD-10-CM

## 2013-02-05 DIAGNOSIS — J9600 Acute respiratory failure, unspecified whether with hypoxia or hypercapnia: Secondary | ICD-10-CM | POA: Diagnosis present

## 2013-02-05 DIAGNOSIS — Z9861 Coronary angioplasty status: Secondary | ICD-10-CM

## 2013-02-05 DIAGNOSIS — R0902 Hypoxemia: Secondary | ICD-10-CM

## 2013-02-05 DIAGNOSIS — Z79899 Other long term (current) drug therapy: Secondary | ICD-10-CM

## 2013-02-05 DIAGNOSIS — K5900 Constipation, unspecified: Secondary | ICD-10-CM | POA: Diagnosis present

## 2013-02-05 DIAGNOSIS — I2510 Atherosclerotic heart disease of native coronary artery without angina pectoris: Secondary | ICD-10-CM

## 2013-02-05 DIAGNOSIS — N179 Acute kidney failure, unspecified: Secondary | ICD-10-CM

## 2013-02-05 DIAGNOSIS — Z789 Other specified health status: Secondary | ICD-10-CM

## 2013-02-05 DIAGNOSIS — Z8673 Personal history of transient ischemic attack (TIA), and cerebral infarction without residual deficits: Secondary | ICD-10-CM

## 2013-02-05 DIAGNOSIS — R931 Abnormal findings on diagnostic imaging of heart and coronary circulation: Secondary | ICD-10-CM

## 2013-02-05 DIAGNOSIS — I4891 Unspecified atrial fibrillation: Secondary | ICD-10-CM

## 2013-02-05 DIAGNOSIS — Z9089 Acquired absence of other organs: Secondary | ICD-10-CM

## 2013-02-05 DIAGNOSIS — Z7901 Long term (current) use of anticoagulants: Secondary | ICD-10-CM

## 2013-02-05 DIAGNOSIS — J441 Chronic obstructive pulmonary disease with (acute) exacerbation: Principal | ICD-10-CM | POA: Diagnosis present

## 2013-02-05 DIAGNOSIS — I428 Other cardiomyopathies: Secondary | ICD-10-CM

## 2013-02-05 DIAGNOSIS — Z8249 Family history of ischemic heart disease and other diseases of the circulatory system: Secondary | ICD-10-CM

## 2013-02-05 DIAGNOSIS — I4892 Unspecified atrial flutter: Secondary | ICD-10-CM | POA: Diagnosis present

## 2013-02-05 DIAGNOSIS — Z8679 Personal history of other diseases of the circulatory system: Secondary | ICD-10-CM

## 2013-02-05 DIAGNOSIS — G459 Transient cerebral ischemic attack, unspecified: Secondary | ICD-10-CM

## 2013-02-05 DIAGNOSIS — R9439 Abnormal result of other cardiovascular function study: Secondary | ICD-10-CM

## 2013-02-05 DIAGNOSIS — N184 Chronic kidney disease, stage 4 (severe): Secondary | ICD-10-CM | POA: Diagnosis present

## 2013-02-05 DIAGNOSIS — I498 Other specified cardiac arrhythmias: Secondary | ICD-10-CM | POA: Diagnosis present

## 2013-02-05 DIAGNOSIS — I509 Heart failure, unspecified: Secondary | ICD-10-CM | POA: Diagnosis present

## 2013-02-05 DIAGNOSIS — J9601 Acute respiratory failure with hypoxia: Secondary | ICD-10-CM

## 2013-02-05 DIAGNOSIS — I129 Hypertensive chronic kidney disease with stage 1 through stage 4 chronic kidney disease, or unspecified chronic kidney disease: Secondary | ICD-10-CM | POA: Diagnosis present

## 2013-02-05 DIAGNOSIS — R001 Bradycardia, unspecified: Secondary | ICD-10-CM

## 2013-02-05 DIAGNOSIS — I5043 Acute on chronic combined systolic (congestive) and diastolic (congestive) heart failure: Secondary | ICD-10-CM | POA: Diagnosis present

## 2013-02-05 LAB — BASIC METABOLIC PANEL
BUN: 40 mg/dL — ABNORMAL HIGH (ref 6–23)
CO2: 22 mEq/L (ref 19–32)
Calcium: 9.1 mg/dL (ref 8.4–10.5)
Chloride: 99 mEq/L (ref 96–112)
Creatinine, Ser: 2.69 mg/dL — ABNORMAL HIGH (ref 0.50–1.35)
GFR calc Af Amer: 24 mL/min — ABNORMAL LOW (ref >90)
GFR calc non Af Amer: 21 mL/min — ABNORMAL LOW (ref >90)
Glucose, Bld: 142 mg/dL — ABNORMAL HIGH (ref 70–99)
Potassium: 4.5 mEq/L (ref 3.5–5.1)
Sodium: 134 mEq/L — ABNORMAL LOW (ref 135–145)

## 2013-02-05 LAB — TROPONIN I: Troponin I: <0.3 ng/mL (ref <0.30)

## 2013-02-05 LAB — CBC
HCT: 42.6 % (ref 39.0–52.0)
Hemoglobin: 14.2 g/dL (ref 13.0–17.0)
MCH: 30.1 pg (ref 26.0–34.0)
MCHC: 33.3 g/dL (ref 30.0–36.0)
MCV: 90.4 fL (ref 78.0–100.0)
Platelets: 144 10*3/uL — ABNORMAL LOW (ref 150–400)
RBC: 4.71 MIL/uL (ref 4.22–5.81)
RDW: 15 % (ref 11.5–15.5)
WBC: 8.3 10*3/uL (ref 4.0–10.5)

## 2013-02-05 LAB — PROTIME-INR
INR: 1.52 — ABNORMAL HIGH (ref 0.00–1.49)
Prothrombin Time: 17.9 seconds — ABNORMAL HIGH (ref 11.6–15.2)

## 2013-02-05 LAB — PRO B NATRIURETIC PEPTIDE: Pro B Natriuretic peptide (BNP): 5194 pg/mL — ABNORMAL HIGH (ref 0–450)

## 2013-02-05 MED ORDER — LEVALBUTEROL HCL 0.63 MG/3ML IN NEBU: 0.6300 mg | INHALATION_SOLUTION | RESPIRATORY_TRACT | Status: DC | PRN

## 2013-02-05 MED ORDER — LEVALBUTEROL HCL 0.63 MG/3ML IN NEBU
0.6300 mg | INHALATION_SOLUTION | Freq: Four times a day (QID) | RESPIRATORY_TRACT | Status: DC
  Administered 2013-02-05 – 2013-02-08 (×10): 0.63 mg via RESPIRATORY_TRACT
  Filled 2013-02-05 (×17): qty 3

## 2013-02-05 MED ORDER — AZITHROMYCIN 250 MG PO TABS
250.0000 mg | ORAL_TABLET | Freq: Every day | ORAL | Status: DC
  Administered 2013-02-07: 250 mg via ORAL
  Filled 2013-02-05: qty 1

## 2013-02-05 MED ORDER — IPRATROPIUM BROMIDE 0.02 % IN SOLN
0.5000 mg | Freq: Four times a day (QID) | RESPIRATORY_TRACT | Status: DC
  Administered 2013-02-05 – 2013-02-07 (×6): 0.5 mg via RESPIRATORY_TRACT
  Filled 2013-02-05 (×5): qty 2.5

## 2013-02-05 MED ORDER — WARFARIN SODIUM 6 MG PO TABS
6.0000 mg | ORAL_TABLET | ORAL | Status: DC
  Administered 2013-02-06: 6 mg via ORAL
  Filled 2013-02-05: qty 1.5
  Filled 2013-02-05: qty 1

## 2013-02-05 MED ORDER — GUAIFENESIN ER 600 MG PO TB12
600.0000 mg | ORAL_TABLET | Freq: Two times a day (BID) | ORAL | Status: DC
  Administered 2013-02-05 – 2013-02-11 (×12): 600 mg via ORAL
  Filled 2013-02-05 (×13): qty 1

## 2013-02-05 MED ORDER — IPRATROPIUM BROMIDE 0.02 % IN SOLN
0.5000 mg | Freq: Once | RESPIRATORY_TRACT | Status: AC
  Administered 2013-02-05: 0.5 mg via RESPIRATORY_TRACT
  Filled 2013-02-05: qty 2.5

## 2013-02-05 MED ORDER — LEVALBUTEROL HCL 1.25 MG/0.5ML IN NEBU
1.2500 mg | INHALATION_SOLUTION | RESPIRATORY_TRACT | Status: DC | PRN
  Administered 2013-02-08: 1.25 mg via RESPIRATORY_TRACT
  Filled 2013-02-05: qty 0.5

## 2013-02-05 MED ORDER — FUROSEMIDE 10 MG/ML IJ SOLN
40.0000 mg | Freq: Once | INTRAMUSCULAR | Status: AC
  Administered 2013-02-05: 40 mg via INTRAVENOUS
  Filled 2013-02-05: qty 4

## 2013-02-05 MED ORDER — ONDANSETRON HCL 4 MG/2ML IJ SOLN: 4.0000 mg | Freq: Three times a day (TID) | INTRAMUSCULAR | Status: AC | PRN

## 2013-02-05 MED ORDER — ASPIRIN EC 81 MG PO TBEC
81.0000 mg | DELAYED_RELEASE_TABLET | Freq: Every day | ORAL | Status: DC
  Administered 2013-02-06 – 2013-02-09 (×4): 81 mg via ORAL
  Filled 2013-02-05 (×4): qty 1

## 2013-02-05 MED ORDER — WARFARIN SODIUM 4 MG PO TABS
4.0000 mg | ORAL_TABLET | ORAL | Status: AC
  Administered 2013-02-07: 4 mg via ORAL
  Filled 2013-02-05: qty 1

## 2013-02-05 MED ORDER — LEVALBUTEROL HCL 0.63 MG/3ML IN NEBU
0.6300 mg | INHALATION_SOLUTION | RESPIRATORY_TRACT | Status: DC | PRN
  Filled 2013-02-05: qty 3

## 2013-02-05 MED ORDER — METHYLPREDNISOLONE SODIUM SUCC 125 MG IJ SOLR
125.0000 mg | Freq: Once | INTRAMUSCULAR | Status: AC
  Administered 2013-02-05: 125 mg via INTRAVENOUS
  Filled 2013-02-05: qty 2

## 2013-02-05 MED ORDER — ALBUTEROL SULFATE (5 MG/ML) 0.5% IN NEBU: 5.0000 mg | INHALATION_SOLUTION | RESPIRATORY_TRACT | Status: DC | PRN

## 2013-02-05 MED ORDER — POTASSIUM CHLORIDE CRYS ER 20 MEQ PO TBCR
20.0000 meq | EXTENDED_RELEASE_TABLET | Freq: Once | ORAL | Status: AC
  Administered 2013-02-05: 20 meq via ORAL
  Filled 2013-02-05: qty 1

## 2013-02-05 MED ORDER — AZITHROMYCIN 250 MG PO TABS
500.0000 mg | ORAL_TABLET | Freq: Once | ORAL | Status: AC
  Administered 2013-02-05: 500 mg via ORAL
  Filled 2013-02-05: qty 2

## 2013-02-05 MED ORDER — ENOXAPARIN SODIUM 40 MG/0.4ML ~~LOC~~ SOLN
40.0000 mg | SUBCUTANEOUS | Status: DC
  Administered 2013-02-05: 40 mg via SUBCUTANEOUS
  Filled 2013-02-05 (×2): qty 0.4

## 2013-02-05 MED ORDER — ALBUTEROL (5 MG/ML) CONTINUOUS INHALATION SOLN
15.0000 mg | INHALATION_SOLUTION | Freq: Once | RESPIRATORY_TRACT | Status: AC
  Administered 2013-02-05: 15 mg via RESPIRATORY_TRACT
  Filled 2013-02-05: qty 20

## 2013-02-05 MED ORDER — SODIUM CHLORIDE 0.9 % IV BOLUS (SEPSIS)
1000.0000 mL | Freq: Once | INTRAVENOUS | Status: AC
  Administered 2013-02-05: 1000 mL via INTRAVENOUS

## 2013-02-05 MED ORDER — WARFARIN - PHYSICIAN DOSING INPATIENT: Freq: Every day | Status: DC

## 2013-02-05 MED ORDER — AZITHROMYCIN 500 MG PO TABS
500.0000 mg | ORAL_TABLET | Freq: Every day | ORAL | Status: AC
  Administered 2013-02-06: 500 mg via ORAL
  Filled 2013-02-05: qty 1

## 2013-02-05 MED ORDER — SODIUM CHLORIDE 0.9 % IV SOLN
INTRAVENOUS | Status: DC
  Administered 2013-02-05: 23:00:00 via INTRAVENOUS

## 2013-02-05 MED ORDER — LEVALBUTEROL HCL 1.25 MG/0.5ML IN NEBU
1.2500 mg | INHALATION_SOLUTION | RESPIRATORY_TRACT | Status: DC | PRN
  Filled 2013-02-05: qty 0.5

## 2013-02-05 MED ORDER — ALBUTEROL SULFATE (5 MG/ML) 0.5% IN NEBU: 2.5000 mg | INHALATION_SOLUTION | RESPIRATORY_TRACT | Status: DC | PRN

## 2013-02-05 MED ORDER — ALBUTEROL SULFATE (5 MG/ML) 0.5% IN NEBU: 2.5000 mg | INHALATION_SOLUTION | Freq: Four times a day (QID) | RESPIRATORY_TRACT | Status: DC

## 2013-02-05 MED ORDER — METHYLPREDNISOLONE SODIUM SUCC 125 MG IJ SOLR
80.0000 mg | Freq: Two times a day (BID) | INTRAMUSCULAR | Status: DC
  Administered 2013-02-06 – 2013-02-07 (×4): 80 mg via INTRAVENOUS
  Filled 2013-02-05 (×5): qty 1.28

## 2013-02-05 NOTE — ED Notes (Signed)
Pt states that he began having SOB 3 days ago. Pt thought that he was getting sick but he had progressively had increased difficulty breathing. Pt was seen by urgent care and noted to have O2 sats in the 80%. Pt received a nebulizer treatment at urgent care with some relief and increase in O2 sat in the 90%.

## 2013-02-05 NOTE — ED Notes (Signed)
PT. Family called and updated on pts care and room status.

## 2013-02-05 NOTE — H&P (Signed)
Chief Complaint:  Sob and wheezing 3 days  HPI: 79 yo male h/o emphysema comes in with 3 days of progressively worsening sob and wheezing.  No fevers.  Coughing.  No n/v/d.  No le edema or swelling.  No cp.  No abd pain.  Still smokes.  Has received several nebs in the ED with solumedrol and feels much better.  No prior h/o chf but does have decreased lv function and cad/stents.  No nasal congestion or myalgias.   Review of Systems:  Positive and negative as per HPI otherwise all other systems are negative  Past Medical History: Past Medical History  Diagnosis Date  . Atrial fibrillation     Detected on an event recorder  . Coronary artery disease     Status post marginal stenting ejection fraction 45%  . Transient ischemic attack   . Echocardiogram abnormal     abnormal ejection fraction as per echo 62f 40-50%  . Emphysema   . PVC (premature ventricular contraction)   . Hypertension   . ACE inhibitor intolerance     Cough   Past Surgical History  Procedure Laterality Date  . Cholecystectomy      Medications: Prior to Admission medications   Medication Sig Start Date End Date Taking? Authorizing Provider  Acetaminophen (NON-ASPIRIN PAIN RELIEVER PO) Take 2 tablets by mouth every 4 (four) hours as needed (for pain).    Yes Historical Provider, MD  guaiFENesin (ROBITUSSIN) 100 MG/5ML liquid Take 200 mg by mouth 4 (four) times daily as needed for cough.   Yes Historical Provider, MD  losartan (COZAAR) 100 MG tablet Take 50 mg by mouth every morning.   Yes Historical Provider, MD  metoprolol (LOPRESSOR) 50 MG tablet Take 25 mg by mouth 2 (two) times daily.   Yes Historical Provider, MD  pravastatin (PRAVACHOL) 20 MG tablet Take 20 mg by mouth daily.   Yes Historical Provider, MD  PRESCRIPTION MEDICATION Apply 1 application topically 2 (two) times daily as needed.   Yes Historical Provider, MD  warfarin (COUMADIN) 2 MG tablet Take 4-6 mg by mouth every evening. Takes 2 tablets  daily, except for Mon, Wed, Fri- takes 6mg .   Yes Historical Provider, MD    Allergies:  No Known Allergies  Social History:  reports that he quit smoking about 8 years ago. He has never used smokeless tobacco. He reports that  drinks alcohol. He reports that he does not use illicit drugs.  Family History: Family History  Problem Relation Age of Onset  . Diabetes Mother   . Heart disease Neg Hx   . Heart attack Neg Hx     Physical Exam: Filed Vitals:   02/05/13 1747 02/05/13 1800  BP:  111/45  Pulse:  98  Resp:  21  SpO2: 96% 99%   General appearance: alert, cooperative and mild distress Head: Normocephalic, without obvious abnormality, atraumatic Eyes: negative Nose: Nares normal. Septum midline. Mucosa normal. No drainage or sinus tenderness. Neck: no JVD and supple, symmetrical, trachea midline Lungs: wheezes bilaterally Heart: regular rate and rhythm, S1, S2 normal, no murmur, click, rub or gallop Abdomen: soft, non-tender; bowel sounds normal; no masses,  no organomegaly Extremities: extremities normal, atraumatic, no cyanosis or edema Pulses: 2+ and symmetric Skin: Skin color, texture, turgor normal. No rashes or lesions Neurologic: Grossly normal    Labs on Admission:   Recent Labs  02/05/13 1716  NA 134*  K 4.5  CL 99  CO2 22  GLUCOSE 142*  BUN  40*  CREATININE 2.69*  CALCIUM 9.1    Recent Labs  02/05/13 1716  WBC 8.3  HGB 14.2  HCT 42.6  MCV 90.4  PLT 144*    Recent Labs  02/05/13 1716  TROPONINI <0.30   Radiological Exams on Admission: Dg Chest Portable 1 View  02/05/2013  *RADIOLOGY REPORT*  Clinical Data: Shortness of breath, wheezing  PORTABLE CHEST - 1 VIEW  Comparison: 11/03/2010  Findings: Moderate enlargement of the cardiomediastinal silhouette noted with central vascular prominence but no overt edema.  No pleural effusion.  No new pulmonary opacity.  Diffusely prominent interstitial markings are noted, increased since the prior  exam.  IMPRESSION: Cardiomegaly mild central vascular congestion and possible early interstitial edema.   Original Report Authenticated By: Christiana Pellant, M.D.     Assessment/Plan 79 yo male with acute hypoxic resp failure secondary to copd exacerbation  Principal Problem:   COPD exacerbation Active Problems:   Atrial fibrillation   Echocardiogram abnormal   Long term (current) use of anticoagulants   Cardiomyopathy, nonischemic   Coronary artery disease status post bare-metal stenting of the acute marginal   ACE inhibitor intolerance   Acute respiratory failure with hypoxia   H/O sinus bradycardia  Place in stepdown overnight.  Iv solumedrol.  Zpack.  freq nebs.  No prior h/o chf and bnp markedly elevated, do not think he is having chf exac but certainly may have some diastolic dysfunction along with his mildly reduced systolic function.  Will ck echo.  Oxygen prn.  Also with new renal failure, hardly any po intake for several days likely prerenal, ck ua and ivf overnight repeat cr in am.  Did get one dose of lasix in ED, will hold off on any further diuretics at this time.   DAVID,RACHAL A 02/05/2013, 9:08 PM

## 2013-02-05 NOTE — ED Provider Notes (Signed)
History    79 year old male with shortness of breath. Gradual onset approximately 3 days ago. Progressively worsening. Symptoms constant. Exacerbated by exertion. No chest pain. Has been using an albuterol inhaler at home with mild relief. History of COPD. No fevers or chills. No change in his chronic cough. No unusual leg pain or swelling.  CSN: [id number]  Arrival date & time 02/05/13  1641   First MD Initiated Contact with Patient 02/05/13 1700      Chief Complaint  Patient presents with  . Shortness of Breath    (Consider location/radiation/quality/duration/timing/severity/associated sxs/prior treatment) HPI  Past Medical History  Diagnosis Date  . Atrial fibrillation     Detected on an event recorder  . Coronary artery disease     Status post marginal stenting ejection fraction 45%  . Transient ischemic attack   . Echocardiogram abnormal     abnormal ejection fraction as per echo 29f 40-50%  . Emphysema   . PVC (premature ventricular contraction)   . Hypertension   . ACE inhibitor intolerance     Cough    Past Surgical History  Procedure Laterality Date  . Cholecystectomy      Family History  Problem Relation Age of Onset  . Diabetes Mother   . Heart disease Neg Hx   . Heart attack Neg Hx     History  Substance Use Topics  . Smoking status: Former Smoker -- 1.00 packs/day for 50 years    Quit date: 02/12/2004  . Smokeless tobacco: Never Used  . Alcohol Use: Yes     Comment: several alcoholic beverages a week      Review of Systems  All systems reviewed and negative, other than as noted in HPI.   Allergies  Review of patient's allergies indicates no known allergies.  Home Medications   Current Outpatient Rx  Name  Route  Sig  Dispense  Refill  . Acetaminophen (NON-ASPIRIN PAIN RELIEVER PO)   Oral   Take 2 tablets by mouth every 4 (four) hours as needed (for pain).          [name] guaiFENesin (ROBITUSSIN) 100 MG/5ML liquid   Oral   Take 200 mg  by mouth 4 (four) times daily as needed for cough.         . losartan (COZAAR) 100 MG tablet   Oral   Take 50 mg by mouth every morning.         . metoprolol (LOPRESSOR) 50 MG tablet   Oral   Take 25 mg by mouth 2 (two) times daily.         . pravastatin (PRAVACHOL) 20 MG tablet   Oral   Take 20 mg by mouth daily.         [name] PRESCRIPTION MEDICATION   Apply externally   Apply 1 application topically 2 (two) times daily as needed.         . warfarin (COUMADIN) 2 MG tablet   Oral   Take 4-6 mg by mouth every evening. Takes 2 tablets daily, except for Mon, Wed, Fri- takes 6mg .           BP 111/45  Pulse 98  Resp 21  SpO2 99%  Physical Exam  Nursing note and vitals reviewed. Constitutional: He appears well-developed and well-nourished. He appears distressed.  HENT:  Head: Normocephalic and atraumatic.  Eyes: Conjunctivae are normal. Right eye exhibits no discharge. Left eye exhibits no discharge.  Neck: Neck supple.  Cardiovascular: Normal rate, regular rhythm and  normal heart sounds.  Exam reveals no gallop and no friction rub.   No murmur heard. Pulmonary/Chest: Effort normal. No respiratory distress. He has wheezes. He has no rales.  Tachypneic. Prolonged expiratory phase  Abdominal: Soft. He exhibits no distension. There is no tenderness.  Musculoskeletal: He exhibits no edema and no tenderness.  Neurological: He is alert.  Skin: Skin is warm and dry. He is not diaphoretic.  Psychiatric: He has a normal mood and affect. His behavior is normal. Thought content normal.    ED Course  Procedures (including critical care time)  Labs Reviewed  CBC - Abnormal; Notable for the following:    Platelets 144 (*)    All other components within normal limits  BASIC METABOLIC PANEL - Abnormal; Notable for the following:    Sodium 134 (*)    Glucose, Bld 142 (*)    BUN 40 (*)    Creatinine, Ser 2.69 (*)    GFR calc non Af Amer 21 (*)    GFR calc Af Amer 24 (*)     All other components within normal limits  PRO B NATRIURETIC PEPTIDE - Abnormal; Notable for the following:    Pro B Natriuretic peptide (BNP) 5194.0 (*)    All other components within normal limits  TROPONIN I   Dg Chest Portable 1 View  02/05/2013  *RADIOLOGY REPORT*  Clinical Data: Shortness of breath, wheezing  PORTABLE CHEST - 1 VIEW  Comparison: 11/03/2010  Findings: Moderate enlargement of the cardiomediastinal silhouette noted with central vascular prominence but no overt edema.  No pleural effusion.  No new pulmonary opacity.  Diffusely prominent interstitial markings are noted, increased since the prior exam.  IMPRESSION: Cardiomegaly mild central vascular congestion and possible early interstitial edema.   Original Report Authenticated By: Christiana Pellant, M.D.    EKG:  Rhythm: sinus tachycardia. PACs and PVCs.  Vent. rate 115 BPM PR interval 148 ms QRS duration 84 ms QT/QTc 324/448 ms ST segments: NS ST changes   1. COPD exacerbation   2. Hypoxemia   3. Acute respiratory failure with hypoxia   4. Cardiomyopathy, nonischemic   5. H/O sinus bradycardia   6. Long term (current) use of anticoagulants   7. Atrial fibrillation       MDM  81yM with continued wheezing/sob after steroids and continuous neb. CXR clear. zithromax per hospitalist request. Admit.         Raeford Razor, MD 02/07/13 [phone]

## 2013-02-05 NOTE — ED Notes (Signed)
Respiratory paged for breathing treatment 

## 2013-02-06 DIAGNOSIS — I517 Cardiomegaly: Secondary | ICD-10-CM

## 2013-02-06 LAB — CBC
HCT: 39.9 % (ref 39.0–52.0)
MCH: 29.5 pg (ref 26.0–34.0)
MCHC: 32.8 g/dL (ref 30.0–36.0)
MCV: 89.9 fL (ref 78.0–100.0)
Platelets: 124 10*3/uL — ABNORMAL LOW (ref 150–400)
RDW: 15.2 % (ref 11.5–15.5)

## 2013-02-06 LAB — BASIC METABOLIC PANEL
BUN: 46 mg/dL — ABNORMAL HIGH (ref 6–23)
CO2: 23 mEq/L (ref 19–32)
Calcium: 8.9 mg/dL (ref 8.4–10.5)
Chloride: 104 mEq/L (ref 96–112)
Creatinine, Ser: 2.84 mg/dL — ABNORMAL HIGH (ref 0.50–1.35)
Glucose, Bld: 219 mg/dL — ABNORMAL HIGH (ref 70–99)

## 2013-02-06 LAB — MRSA PCR SCREENING: MRSA by PCR: NEGATIVE

## 2013-02-06 MED ORDER — FUROSEMIDE 10 MG/ML IJ SOLN
40.0000 mg | Freq: Once | INTRAMUSCULAR | Status: AC
  Administered 2013-02-06: 40 mg via INTRAVENOUS
  Filled 2013-02-06: qty 4

## 2013-02-06 MED ORDER — SIMVASTATIN 5 MG PO TABS
5.0000 mg | ORAL_TABLET | Freq: Every day | ORAL | Status: DC
  Administered 2013-02-07 – 2013-02-10 (×4): 5 mg via ORAL
  Filled 2013-02-06 (×5): qty 1

## 2013-02-06 MED ORDER — CALCIUM CARBONATE ANTACID 500 MG PO CHEW
800.0000 mg | CHEWABLE_TABLET | Freq: Three times a day (TID) | ORAL | Status: DC | PRN
  Administered 2013-02-06: 200 mg via ORAL
  Administered 2013-02-08: 800 mg via ORAL
  Filled 2013-02-06 (×3): qty 4

## 2013-02-06 NOTE — Progress Notes (Signed)
  Echocardiogram 2D Echocardiogram has been performed.  Ellender Hose A 02/06/2013, 3:53 PM

## 2013-02-06 NOTE — Progress Notes (Signed)
[facility] PROGRESS NOTE  Keith Larson WUJ:[id number] DOB: Jan 07, 1932 DOA: 02/05/2013 PCP: Default, Provider, MD  Assessment/Plan: 1. COPD exacerbation - patient admitted with a moderate to severe COPD exacerbation, prolonged by an upper respiratory tract infection. Patient was admitted to step down unit, started on supplemental oxygen, started on IV steroids, and bronchodilators. Patient was also placed on IV azithromycin. By hospital day #2 the patient improved and was transferred out to the floor 2. Atrial fibrillation on chronic coumadin - continue full dose anticoagulation in the hospital 3.  CAD s/p stent - patient without signs of acute coronary syndrome. Continue aspirin 4.  CHF acute on chronic systolic and diastolic - proBNP - 5194. Since his been 3 years since her last echocardiogram we have ordered repeat study for this patient. We have treated the patient with IV furosemide of March 11 and March 12 5. Acute on chronic renal failure - baseline creatinine is 1.5 patient with CKD stage III. etiology is unclear-monitor renal function closely  Code Status: Full code Family Communication: No family in the room (indicate person spoken with, relationship, and if by phone, the number) Disposition Plan: Home   Consultants:  None  Procedures:  None  Antibiotics:  Azithromycin March 12 (indicate start date, and stop date if known)  HPI/Subjective: Feels better  Objective: Filed Vitals:   02/06/13 0003 02/06/13 0146 02/06/13 0400 02/06/13 0739  BP: 112/52  101/43 114/53  Pulse: 108  94 84  Temp: 97.2 F (36.2 C)  97.6 F (36.4 C) 97.4 F (36.3 C)  TempSrc: Oral  Oral Oral  Resp: 13  12 13   Height:      Weight:      SpO2: 97% 97% 96% 96%   Patient Vitals for the past 24 hrs:  BP Temp Temp src Pulse Resp SpO2 Height Weight  02/06/13 1537 141/55 mmHg 98.4 F (36.9 C) Oral 92 17 95 % - -  02/06/13 1143 131/64 mmHg 97.7 F (36.5 C) Oral 102 18 92 % - -  02/06/13  0856 - - - - - 93 % - -  02/06/13 0739 114/53 mmHg 97.4 F (36.3 C) Oral 84 13 96 % - -  02/06/13 0400 101/43 mmHg 97.6 F (36.4 C) Oral 94 12 96 % - -  02/06/13 0146 - - - - - 97 % - -  02/06/13 0003 112/52 mmHg 97.2 F (36.2 C) Oral 108 13 97 % - -  02/05/13 2258 - - - - - 96 % - -  02/05/13 2145 136/61 mmHg 98 F (36.7 C) Oral 118 22 92 % 5\' 8"  (1.727 m) 68.8 kg (151 lb 10.8 oz)  02/05/13 2111 117/46 mmHg - - 120 16 94 % - -  02/05/13 1800 111/45 mmHg - - 98 21 99 % - -  02/05/13 1747 - - - - - 96 % - -     Intake/Output Summary (Last 24 hours) at 02/06/13 0817 Last data filed at 02/06/13 0800  Gross per 24 hour  Intake 658.75 ml  Output    300 ml  Net 358.75 ml   Filed Weights   02/05/13 2145  Weight: 68.8 kg (151 lb 10.8 oz)    Exam:   General:  Alert and oriented x3  Cardiovascular: iregular rhythm without murmurs  Respiratory: Bilateral wheezes, bilateral basilar crackles  Abdomen: Soft nontender bowel sounds are present  Musculoskeletal: Minimal lower extremity edema   Data Reviewed: Basic Metabolic Panel:  Recent Labs Lab 02/05/13 1716  NA 134*  K 4.5  CL 99  CO2 22  GLUCOSE 142*  BUN 40*  CREATININE 2.69*  CALCIUM 9.1   Liver Function Tests: No results found for this basename: AST, ALT, ALKPHOS, BILITOT, PROT, ALBUMIN,  in the last 168 hours No results found for this basename: LIPASE, AMYLASE,  in the last 168 hours No results found for this basename: AMMONIA,  in the last 168 hours CBC:  Recent Labs Lab 02/05/13 1716  WBC 8.3  HGB 14.2  HCT 42.6  MCV 90.4  PLT 144*   Cardiac Enzymes:  Recent Labs Lab 02/05/13 1716  TROPONINI <0.30   BNP (last 3 results)  Recent Labs  02/05/13 1716  PROBNP 5194.0*   CBG: No results found for this basename: GLUCAP,  in the last 168 hours  Recent Results (from the past 240 hour(s))  MRSA PCR SCREENING     Status: None   Collection Time    02/06/13  4:43 AM      Result Value Range  Status   MRSA by PCR NEGATIVE  NEGATIVE Final   Comment:            The GeneXpert MRSA Assay (FDA     approved for NASAL specimens     only), is one component of a     comprehensive MRSA colonization     surveillance program. It is not     intended to diagnose MRSA     infection nor to guide or     monitor treatment for     MRSA infections.     Studies: Dg Chest Portable 1 View  02/05/2013  *RADIOLOGY REPORT*  Clinical Data: Shortness of breath, wheezing  PORTABLE CHEST - 1 VIEW  Comparison: 11/03/2010  Findings: Moderate enlargement of the cardiomediastinal silhouette noted with central vascular prominence but no overt edema.  No pleural effusion.  No new pulmonary opacity.  Diffusely prominent interstitial markings are noted, increased since the prior exam.  IMPRESSION: Cardiomegaly mild central vascular congestion and possible early interstitial edema.   Original Report Authenticated By: Christiana Pellant, M.D.     Scheduled Meds: . aspirin EC  81 mg Oral Daily  . azithromycin  500 mg Oral Daily   Followed by  . [START ON 02/07/2013] azithromycin  250 mg Oral Daily  . guaiFENesin  600 mg Oral BID  . ipratropium  0.5 mg Nebulization Q6H  . levalbuterol  0.63 mg Nebulization Q6H  . methylPREDNISolone (SOLU-MEDROL) injection  80 mg Intravenous Q12H  . [START ON 02/07/2013] warfarin  4 mg Oral Custom  . warfarin  6 mg Oral Q M,W,F-1800  . Warfarin - Physician Dosing Inpatient   Does not apply q1800   Continuous Infusions: . sodium chloride 75 mL/hr at 02/06/13 0600    Principal Problem:   COPD exacerbation Active Problems:   Atrial fibrillation   Echocardiogram abnormal   Long term (current) use of anticoagulants   Cardiomyopathy, nonischemic   Coronary artery disease status post bare-metal stenting of the acute marginal   ACE inhibitor intolerance   Acute respiratory failure with hypoxia   H/O sinus bradycardia   Renal failure, acute     Keith Larson  [facility] Pager [phone]. If 7PM-7AM, please contact night-coverage at [email or web address], password [facility] 02/06/2013, 8:17 AM  LOS: 1 day

## 2013-02-06 NOTE — Progress Notes (Signed)
PHARMACIST - PHYSICIAN COMMUNICATION DR:  Lavera Guise CONCERNING: Pharmacy Care Issues Regarding Warfarin Labs  RECOMMENDATION (Action Taken): A baseline and daily protime for three days has been ordered to meet the [facility] Patient safety goal and comply with the current [facility] policy.   The Pharmacy will defer all warfarin dose order changes and follow up of lab results to the prescriber unless an additional order to initiate a "pharmacy Coumadin consult" is placed.  DESCRIPTION:  While hospitalized, to be in compliance with The [employer] Commission National Patient Safety Goals, all patients on warfarin must have a baseline and/or current protime prior to the administration of warfarin. Pharmacy has received your order for warfarin without these required laboratory assessments.  [place], [address].D., BCPS Clinical Pharmacist Pager: [phone] 02/06/2013 1:47 PM

## 2013-02-06 NOTE — Progress Notes (Signed)
Utilization review completed.  

## 2013-02-07 LAB — CBC
MCH: 29.9 pg (ref 26.0–34.0)
MCHC: 33.1 g/dL (ref 30.0–36.0)
MCV: 90.4 fL (ref 78.0–100.0)
Platelets: 138 10*3/uL — ABNORMAL LOW (ref 150–400)
RBC: 4.25 MIL/uL (ref 4.22–5.81)

## 2013-02-07 LAB — BASIC METABOLIC PANEL
BUN: 57 mg/dL — ABNORMAL HIGH (ref 6–23)
CO2: 25 mEq/L (ref 19–32)
Calcium: 9 mg/dL (ref 8.4–10.5)
Creatinine, Ser: 2.6 mg/dL — ABNORMAL HIGH (ref 0.50–1.35)
Glucose, Bld: 154 mg/dL — ABNORMAL HIGH (ref 70–99)
Sodium: 138 mEq/L (ref 135–145)

## 2013-02-07 LAB — GLUCOSE, CAPILLARY: Glucose-Capillary: 133 mg/dL — ABNORMAL HIGH (ref 70–99)

## 2013-02-07 LAB — URINALYSIS, ROUTINE W REFLEX MICROSCOPIC
Glucose, UA: NEGATIVE mg/dL
Ketones, ur: NEGATIVE mg/dL
Protein, ur: 30 mg/dL — AB
Urobilinogen, UA: 0.2 mg/dL (ref 0.0–1.0)

## 2013-02-07 LAB — URINE MICROSCOPIC-ADD ON

## 2013-02-07 MED ORDER — DILTIAZEM HCL 60 MG PO TABS
60.0000 mg | ORAL_TABLET | Freq: Four times a day (QID) | ORAL | Status: DC
  Administered 2013-02-07 – 2013-02-09 (×8): 60 mg via ORAL
  Filled 2013-02-07 (×12): qty 1

## 2013-02-07 MED ORDER — METHYLPREDNISOLONE SODIUM SUCC 125 MG IJ SOLR
80.0000 mg | Freq: Three times a day (TID) | INTRAMUSCULAR | Status: DC
  Administered 2013-02-07 – 2013-02-08 (×2): 80 mg via INTRAVENOUS
  Filled 2013-02-07 (×5): qty 1.28

## 2013-02-07 MED ORDER — LEVOFLOXACIN IN D5W 750 MG/150ML IV SOLN
750.0000 mg | INTRAVENOUS | Status: DC
  Administered 2013-02-07: 750 mg via INTRAVENOUS
  Filled 2013-02-07: qty 150

## 2013-02-07 MED ORDER — TIOTROPIUM BROMIDE MONOHYDRATE 18 MCG IN CAPS
18.0000 ug | ORAL_CAPSULE | Freq: Every day | RESPIRATORY_TRACT | Status: DC
  Administered 2013-02-08 – 2013-02-11 (×4): 18 ug via RESPIRATORY_TRACT
  Filled 2013-02-07: qty 5

## 2013-02-07 MED ORDER — METOPROLOL TARTRATE 1 MG/ML IV SOLN
5.0000 mg | INTRAVENOUS | Status: DC | PRN
  Administered 2013-02-07 (×2): 5 mg via INTRAVENOUS
  Filled 2013-02-07 (×2): qty 5

## 2013-02-07 NOTE — Progress Notes (Addendum)
Pt. Was a transfer from 3300 via wheelchair AAO X 4 ,with O2  At 2 lnc in use .Placed pt.on heart monitor &  Notified central cardiac monitor.Oriented pt. To the room & call bell  & reviewed with the pt.the safety prevention plan.Noted in heart monitor pt.'s HR=140-150.Pt.is asymptomatic.MD on call Craige Cotta  Made aware & ordered stat EKG & Metoprolol 5 mg IV prn for HR > 110.

## 2013-02-07 NOTE — Progress Notes (Signed)
Text message from central telemetry monitoring that pt. Was extreme tachy HR 130's 140's.  Pt. Resting in bed stated he had been coughing after getting tickled from a joke that his visitors had told.  VSS - Blood pressure 116/66, pulse 138, temperature 97.5 F (36.4 C), temperature source Oral, resp. rate 20, height 5\' 8"  (1.727 m), weight 68.8 kg (151 lb 10.8 oz), SpO2 96.00%., O2   @ 3l L.  Lopressor 5 mg IV was given as ordered for HR >110.  Dr. Lavera Guise texted paged.  Will continue to monitor and await for return call.  Forbes Cellar, RN

## 2013-02-07 NOTE — Progress Notes (Signed)
Shift event: RN paged NP earlier in shift 2/2 pt have sinus tachycardia in the 140-150 range. Pt here for COPD and wasn't on Albuterol, so that was r/o as a cause. NP to unit. Pt is resting comfortably and says he "can't tell" his heart is racing. He denies any chest pain and only has SOB when he "coughs a lot". Has never been told her had irreg heartbeat but thinks he remembers having a fast heartbeat in the past but denies any specific treatment for it. Denies fever, chills.  He appears well, oriented and pleasant. NAD. Normal resp effort. Mild exp wheezing but fair to good air exchange. Card: S1S2, tachy, Reg rhythm. Skin is pink warm and dry.   We have him one dose of Metoprolol 5mg  IV and he spontaneously became NSR and tachycardia dissipated. HR has been in the 80-90s since then. Report to oncoming attending.  Jimmye Norman, NP

## 2013-02-07 NOTE — Progress Notes (Signed)
HR continues to ring extreme tachy 130's on the monitor.  Dr. Lavera Guise up on the floor and informed.  Orders to get EKG.   Will continue to monitor.  Forbes Cellar, RN

## 2013-02-07 NOTE — Progress Notes (Signed)
Still pt.'s HR =140'S in  Heart monitor.Text paged again NP Kirby,& came to see pt & spoke to him.Was about to give another 5 mg of Metoprolol  IV ,but his HR went down to 90's & ordered to hold the  Metoprolol now.Will continue to monitor pt.

## 2013-02-07 NOTE — Progress Notes (Signed)
[facility] PROGRESS NOTE  Keith Larson BJY:[id number] DOB: 01-15-1932 DOA: 02/05/2013 PCP: Default, Provider, MD  Assessment/Plan: 1. COPD exacerbation - patient admitted with a moderate to severe COPD exacerbation, prolonged by an upper respiratory tract infection. Patient was admitted to step down unit, started on supplemental oxygen, started on IV steroids, and bronchodilators. Patient was also placed on IV azithromycin. By hospital day #2 the patient improved and was transferred out to the floor. On February 07, 2013 we have noted that the patient continued to make yellow-green sputum. Antibiotics were changed to high dose Levaquin. Azithromycin was discontinued 2. Atrial fibrillation on chronic coumadin - continue full dose anticoagulation in the hospital. Patient having episodes of atrial fibrillation with rapid ventricular response. Patient started on Cardizem 60 mg every 6 hours by mouth. If he continues to have significant difficulties with arrhythmia we will reconsult Dr. Sherryl Manges the patient's primary electrophysiologist.  3.  CAD s/p stent - patient without signs of acute coronary syndrome. Continue aspirin 4.  CHF acute on chronic systolic and diastolic - proBNP - 5194. Since his been 3 years since her last echocardiogram we have ordered repeat study for this patient. We have treated the patient with IV furosemide of March 11 and March 12. Echo during this admission indicating preserved EF and diastolic dysfunction only.  5. Acute on chronic renal failure - baseline creatinine is 1.5 patient with CKD stage III. etiology is unclear-monitor renal function closely. Ua with minimal proteinuria. Patient on ARB PTA - will need close f/u at Discharge   Code Status: Full code Family Communication: No family in the room (indicate person spoken with, relationship, and if by phone, the number) Disposition Plan: Home   Consultants:  None  Procedures:  Echocardiogram  Study  Conclusions  Left ventricle: The cavity size was normal. Wall thickness was increased in a pattern of mild LVH. Systolic function was normal. The estimated ejection fraction was in the range of 55% to 60%. Wall motion was normal; there were no regional wall motion abnormalities.     Antibiotics:  Azithromycin March 12   HPI/Subjective: Feels better  Objective: Filed Vitals:   02/06/13 2345 02/07/13 0214 02/07/13 0325 02/07/13 0506  BP: 110/66 113/69  107/62  Pulse: 150 144  86  Temp: 98.2 F (36.8 C) 98.1 F (36.7 C)  97.5 F (36.4 C)  TempSrc: Oral Oral  Oral  Resp:  20  20  Height:      Weight:      SpO2: 99% 96% 99% 95%   Patient Vitals for the past 24 hrs:  BP Temp Temp src Pulse Resp SpO2  02/07/13 0506 107/62 mmHg 97.5 F (36.4 C) Oral 86 20 95 %  02/07/13 0325 - - - - - 99 %  02/07/13 0214 113/69 mmHg 98.1 F (36.7 C) Oral 144 20 96 %  02/06/13 2345 110/66 mmHg 98.2 F (36.8 C) Oral 150 - 99 %  02/06/13 2000 153/74 mmHg 98 F (36.7 C) Oral 108 20 93 %  02/06/13 1940 - - - - - 94 %  02/06/13 1537 141/55 mmHg 98.4 F (36.9 C) Oral 92 17 95 %  02/06/13 1143 131/64 mmHg 97.7 F (36.5 C) Oral 102 18 92 %  02/06/13 0856 - - - - - 93 %     Intake/Output Summary (Last 24 hours) at 02/07/13 0855 Last data filed at 02/07/13 0612  Gross per 24 hour  Intake 1657.42 ml  Output   1920 ml  Net -  262.58 ml   Filed Weights   02/05/13 2145  Weight: 68.8 kg (151 lb 10.8 oz)    Exam:   General:  Alert and oriented x3  Cardiovascular: iregular rhythm without murmurs  Respiratory: Bilateral wheezes, bilateral basilar crackles  Abdomen: Soft nontender bowel sounds are present  Musculoskeletal: Minimal lower extremity edema   Data Reviewed: Basic Metabolic Panel:  Recent Labs Lab 02/05/13 1716 02/06/13 0857 02/07/13 0450  NA 134* 138 138  K 4.5 4.4 4.3  CL 99 104 104  CO2 22 23 25   GLUCOSE 142* 219* 154*  BUN 40* 46* 57*  CREATININE 2.69*  2.84* 2.60*  CALCIUM 9.1 8.9 9.0   Liver Function Tests: No results found for this basename: AST, ALT, ALKPHOS, BILITOT, PROT, ALBUMIN,  in the last 168 hours No results found for this basename: LIPASE, AMYLASE,  in the last 168 hours No results found for this basename: AMMONIA,  in the last 168 hours CBC:  Recent Labs Lab 02/05/13 1716 02/06/13 0857 02/07/13 0450  WBC 8.3 7.5 12.9*  HGB 14.2 13.1 12.7*  HCT 42.6 39.9 38.4*  MCV 90.4 89.9 90.4  PLT 144* 124* 138*   Cardiac Enzymes:  Recent Labs Lab 02/05/13 1716  TROPONINI <0.30   BNP (last 3 results)  Recent Labs  02/05/13 1716  PROBNP 5194.0*   CBG: No results found for this basename: GLUCAP,  in the last 168 hours  Recent Results (from the past 240 hour(s))  MRSA PCR SCREENING     Status: None   Collection Time    02/06/13  4:43 AM      Result Value Range Status   MRSA by PCR NEGATIVE  NEGATIVE Final   Comment:            The GeneXpert MRSA Assay (FDA     approved for NASAL specimens     only), is one component of a     comprehensive MRSA colonization     surveillance program. It is not     intended to diagnose MRSA     infection nor to guide or     monitor treatment for     MRSA infections.     Studies: Dg Chest Portable 1 View  02/05/2013  *RADIOLOGY REPORT*  Clinical Data: Shortness of breath, wheezing  PORTABLE CHEST - 1 VIEW  Comparison: 11/03/2010  Findings: Moderate enlargement of the cardiomediastinal silhouette noted with central vascular prominence but no overt edema.  No pleural effusion.  No new pulmonary opacity.  Diffusely prominent interstitial markings are noted, increased since the prior exam.  IMPRESSION: Cardiomegaly mild central vascular congestion and possible early interstitial edema.   Original Report Authenticated By: Christiana Pellant, M.D.     Scheduled Meds: . aspirin EC  81 mg Oral Daily  . azithromycin  250 mg Oral Daily  . guaiFENesin  600 mg Oral BID  . levalbuterol  0.63  mg Nebulization Q6H  . methylPREDNISolone (SOLU-MEDROL) injection  80 mg Intravenous Q12H  . simvastatin  5 mg Oral q1800  . tiotropium  18 mcg Inhalation Daily  . warfarin  4 mg Oral Custom  . warfarin  6 mg Oral Q M,W,F-1800  . Warfarin - Physician Dosing Inpatient   Does not apply q1800   Continuous Infusions: . sodium chloride 20 mL/hr at 02/06/13 0931    Principal Problem:   COPD exacerbation Active Problems:   Atrial fibrillation   Echocardiogram abnormal   Long term (current) use of anticoagulants  Cardiomyopathy, nonischemic   Coronary artery disease status post bare-metal stenting of the acute marginal   ACE inhibitor intolerance   Acute respiratory failure with hypoxia   H/O sinus bradycardia   Renal failure, acute     [name],[name]  [facility] Pager [phone]. If 7PM-7AM, please contact night-coverage at [email or web address], password [facility] 02/07/2013, 8:55 AM  LOS: 2 days

## 2013-02-07 NOTE — Progress Notes (Signed)
Inpatient Diabetes Program Recommendations  AACE/ADA: New Consensus Statement on Inpatient Glycemic Control (2013)  Target Ranges:  Prepandial:   less than 140 mg/dL      Peak postprandial:   less than 180 mg/dL (1-2 hours)      Critically ill patients:  140 - 180 mg/dL   Elevated lab glucose while on high dose steroids HgbA1C of 6.7% in 2011 (pre-dm).   Please check current HgbA1C Please check cbg's tidwc and at HS.  Use Sensitive correction tid. Please add carbohydrate modified to diet orders while on high dose steroids.  Thank you, Lenor Coffin, RN, CNS, Diabetes Coordinator [phone])

## 2013-02-08 LAB — BASIC METABOLIC PANEL
CO2: 26 mEq/L (ref 19–32)
Calcium: 8.8 mg/dL (ref 8.4–10.5)
Chloride: 103 mEq/L (ref 96–112)
Glucose, Bld: 184 mg/dL — ABNORMAL HIGH (ref 70–99)
Potassium: 4.7 mEq/L (ref 3.5–5.1)
Sodium: 138 mEq/L (ref 135–145)

## 2013-02-08 LAB — PROTIME-INR
INR: 2.99 — ABNORMAL HIGH (ref 0.00–1.49)
Prothrombin Time: 29.5 seconds — ABNORMAL HIGH (ref 11.6–15.2)

## 2013-02-08 LAB — CBC
Hemoglobin: 12.3 g/dL — ABNORMAL LOW (ref 13.0–17.0)
Platelets: 150 10*3/uL (ref 150–400)
RBC: 4.22 MIL/uL (ref 4.22–5.81)
WBC: 10.2 10*3/uL (ref 4.0–10.5)

## 2013-02-08 MED ORDER — PREDNISONE 50 MG PO TABS
60.0000 mg | ORAL_TABLET | Freq: Every day | ORAL | Status: DC
  Administered 2013-02-08 – 2013-02-09 (×2): 60 mg via ORAL
  Filled 2013-02-08 (×3): qty 1

## 2013-02-08 MED ORDER — POLYETHYLENE GLYCOL 3350 17 G PO PACK
34.0000 g | PACK | Freq: Once | ORAL | Status: AC
  Administered 2013-02-08: 17 g via ORAL
  Filled 2013-02-08: qty 2

## 2013-02-08 MED ORDER — WARFARIN SODIUM 4 MG PO TABS: 4.0000 mg | ORAL_TABLET | ORAL | Status: DC

## 2013-02-08 MED ORDER — WARFARIN - PHARMACIST DOSING INPATIENT: Freq: Every day | Status: DC

## 2013-02-08 MED ORDER — DILTIAZEM HCL 25 MG/5ML IV SOLN
5.0000 mg | Freq: Once | INTRAVENOUS | Status: AC
  Administered 2013-02-08: 5 mg via INTRAVENOUS
  Filled 2013-02-08: qty 5

## 2013-02-08 MED ORDER — WARFARIN SODIUM 1 MG PO TABS
1.0000 mg | ORAL_TABLET | Freq: Once | ORAL | Status: AC
  Administered 2013-02-08: 1 mg via ORAL
  Filled 2013-02-08: qty 1

## 2013-02-08 MED ORDER — LEVALBUTEROL HCL 0.63 MG/3ML IN NEBU
0.6300 mg | INHALATION_SOLUTION | Freq: Three times a day (TID) | RESPIRATORY_TRACT | Status: DC
  Administered 2013-02-08 – 2013-02-09 (×3): 0.63 mg via RESPIRATORY_TRACT
  Filled 2013-02-08 (×5): qty 3

## 2013-02-08 MED ORDER — LEVOFLOXACIN IN D5W 750 MG/150ML IV SOLN
750.0000 mg | INTRAVENOUS | Status: DC
  Administered 2013-02-09: 750 mg via INTRAVENOUS
  Filled 2013-02-08 (×2): qty 150

## 2013-02-08 NOTE — Progress Notes (Signed)
Resting O2 sat on room air was 84%. Placed pt on 2L O2 via nasal cannula and sats were 92-93%.

## 2013-02-08 NOTE — Progress Notes (Signed)
[facility] PROGRESS NOTE  Dewitte Vannice WUJ:[id number] DOB: [birth year] DOA: 02/05/2013 PCP: Default, Provider, MD  Assessment/Plan: 1. COPD exacerbation - patient admitted with a moderate to severe COPD exacerbation, brought on  by an upper respiratory tract infection. Patient was admitted to step down unit, started on supplemental oxygen, started on IV steroids, and bronchodilators. Patient was also placed on IV azithromycin. By hospital day #2 the patient improved and was transferred out to the floor. On February 07, 2013 we have noted that the patient continued to make yellow-green sputum. Antibiotics were changed to high dose Levaquin. Azithromycin was discontinued 2. Atrial fibrillation on chronic coumadin - continue full dose anticoagulation in the hospital. Patient having episodes of atrial fibrillation with rapid ventricular response. Patient started on Cardizem 60 mg every 6 hours by mouth. If he continues to have significant difficulties with arrhythmia we will reconsult Dr. Sherryl Manges the patient's primary electrophysiologist.  3.  CAD s/p stent - patient without signs of acute coronary syndrome. Continue aspirin 4.  CHF acute on chronic systolic and diastolic - proBNP - 5194. Since his been 3 years since her last echocardiogram we have ordered repeat study for this patient. We have treated the patient with IV furosemide of March 11 and March 12. Echo during this admission indicating preserved EF and diastolic dysfunction only.  5. Acute on chronic renal failure - baseline creatinine is 1.5 patient with CKD stage III. etiology is unclear-monitor renal function closely. Ua with minimal proteinuria. Patient on ARB PTA - will need close f/u at Discharge  6. Constipation - LOC  Code Status: Full code Family Communication: No family in the room (indicate person spoken with, relationship, and if by phone, the number) Disposition Plan:  Home   Consultants:  None  Procedures:  Echocardiogram  Study Conclusions  Left ventricle: The cavity size was normal. Wall thickness was increased in a pattern of mild LVH. Systolic function was normal. The estimated ejection fraction was in the range of 55% to 60%. Wall motion was normal; there were no regional wall motion abnormalities.     Antibiotics:  Azithromycin March 12   HPI/Subjective: Feels better  Objective: Filed Vitals:   02/08/13 1035 02/08/13 1332 02/08/13 1423 02/08/13 1445  BP: 117/58 120/64 114/70   Pulse: 111  135   Temp: 97.5 F (36.4 C)  97.5 F (36.4 C)   TempSrc: Oral  Oral   Resp: 16  18   Height:      Weight:      SpO2: 95%  95% 92%   Patient Vitals for the past 24 hrs:  BP Temp Temp src Pulse Resp SpO2 Weight  02/08/13 1445 - - - - - 92 % -  02/08/13 1423 114/70 mmHg 97.5 F (36.4 C) Oral 135 18 95 % -  02/08/13 1332 120/64 mmHg - - - - - -  02/08/13 1035 117/58 mmHg 97.5 F (36.4 C) Oral 111 16 95 % -  02/08/13 0818 - - - - - 95 % -  02/08/13 0500 117/64 mmHg 97.8 F (36.6 C) Oral 101 20 91 % 73 kg (160 lb 15 oz)  02/08/13 0259 - - - - - 95 % -  02/07/13 2123 - - - - - 95 % -  02/07/13 2055 100/58 mmHg 97.5 F (36.4 C) Oral 88 20 93 % -  02/07/13 1829 120/68 mmHg - - - - - -     Intake/Output Summary (Last 24 hours) at 02/08/13 1535 Last data  filed at 02/08/13 1300  Gross per 24 hour  Intake    680 ml  Output    275 ml  Net    405 ml   Filed Weights   02/05/13 2145 02/08/13 0500  Weight: 68.8 kg (151 lb 10.8 oz) 73 kg (160 lb 15 oz)    Exam:   General:  Alert and oriented x3  Cardiovascular: iregular rhythm without murmurs  Respiratory: Bilateral wheezes, bilateral basilar crackles  Abdomen: Soft nontender bowel sounds are present  Musculoskeletal: Minimal lower extremity edema   Data Reviewed: Basic Metabolic Panel:  Recent Labs Lab 02/05/13 1716 02/06/13 0857 02/07/13 0450 02/08/13 0525  NA  134* 138 138 138  K 4.5 4.4 4.3 4.7  CL 99 104 104 103  CO2 22 23 25 26   GLUCOSE 142* 219* 154* 184*  BUN 40* 46* 57* 65*  CREATININE 2.69* 2.84* 2.60* 2.33*  CALCIUM 9.1 8.9 9.0 8.8   Liver Function Tests: No results found for this basename: AST, ALT, ALKPHOS, BILITOT, PROT, ALBUMIN,  in the last 168 hours No results found for this basename: LIPASE, AMYLASE,  in the last 168 hours No results found for this basename: AMMONIA,  in the last 168 hours CBC:  Recent Labs Lab 02/05/13 1716 02/06/13 0857 02/07/13 0450 02/08/13 0525  WBC 8.3 7.5 12.9* 10.2  HGB 14.2 13.1 12.7* 12.3*  HCT 42.6 39.9 38.4* 37.2*  MCV 90.4 89.9 90.4 88.2  PLT 144* 124* 138* 150   Cardiac Enzymes:  Recent Labs Lab 02/05/13 1716  TROPONINI <0.30   BNP (last 3 results)  Recent Labs  02/05/13 1716  PROBNP 5194.0*   CBG:  Recent Labs Lab 02/07/13 2054  GLUCAP 133*    Recent Results (from the past 240 hour(s))  MRSA PCR SCREENING     Status: None   Collection Time    02/06/13  4:43 AM      Result Value Range Status   MRSA by PCR NEGATIVE  NEGATIVE Final   Comment:            The GeneXpert MRSA Assay (FDA     approved for NASAL specimens     only), is one component of a     comprehensive MRSA colonization     surveillance program. It is not     intended to diagnose MRSA     infection nor to guide or     monitor treatment for     MRSA infections.     Studies: No results found.  Scheduled Meds: . aspirin EC  81 mg Oral Daily  . diltiazem  60 mg Oral Q6H  . guaiFENesin  600 mg Oral BID  . levalbuterol  0.63 mg Nebulization TID  . levofloxacin (LEVAQUIN) IV  750 mg Intravenous Q48H  . predniSONE  60 mg Oral Q breakfast  . simvastatin  5 mg Oral q1800  . tiotropium  18 mcg Inhalation Daily  . warfarin  1 mg Oral ONCE-1800  . Warfarin - Pharmacist Dosing Inpatient   Does not apply q1800   Continuous Infusions:    Principal Problem:   COPD exacerbation Active Problems:    Atrial fibrillation   Echocardiogram abnormal   Long term (current) use of anticoagulants   Cardiomyopathy, nonischemic   Coronary artery disease status post bare-metal stenting of the acute marginal   ACE inhibitor intolerance   Acute respiratory failure with hypoxia   H/O sinus bradycardia   Renal failure, acute  Keith Larson  [facility] Pager [phone]. If 7PM-7AM, please contact night-coverage at [email or web address], password [facility] 02/08/2013, 3:35 PM  LOS: 3 days

## 2013-02-08 NOTE — Progress Notes (Signed)
Informed by central monitoring that pt. Had 7 beats of VT.  VSS - Blood pressure 117/58, pulse 111, temperature 97.5 F (36.4 C), temperature source Oral, resp. rate 16, height 5\' 8"  (1.727 m), weight 73 kg (160 lb 15 oz), SpO2 95.00%.O2   @ 3 L.  Dr. Lavera Guise texted paged.  Will await for return call or new orders and continue to monitor.  Forbes Cellar, RN

## 2013-02-08 NOTE — Progress Notes (Signed)
ANTICOAGULATION CONSULT NOTE - Initial Consult  Pharmacy Consult for Coumadin Indication: atrial fibrillation  No Known Allergies  Patient Measurements: Height: 5\' 8"  (172.7 cm) Weight: 160 lb 15 oz (73 kg) IBW/kg (Calculated) : 68.4  Vital Signs: Temp: 97.5 F (36.4 C) (03/14 1035) Temp src: Oral (03/14 1035) BP: 117/58 mmHg (03/14 1035) Pulse Rate: 111 (03/14 1035)  Labs:  Recent Labs  02/05/13 1716  02/06/13 0857 02/06/13 1354 02/07/13 0450 02/08/13 0525  HGB 14.2  --  13.1  --  12.7* 12.3*  HCT 42.6  --  39.9  --  38.4* 37.2*  PLT 144*  --  124*  --  138* 150  LABPROT  --   < >  --  18.1* 23.1* 29.5*  INR  --   < >  --  1.55* 2.15* 2.99*  CREATININE 2.69*  --  2.84*  --  2.60* 2.33*  TROPONINI <0.30  --   --   --   --   --   < > = values in this interval not displayed.  Estimated Creatinine Clearance: 24.9 ml/min (by C-G formula based on Cr of 2.33).   Medical History: Past Medical History  Diagnosis Date  . Atrial fibrillation     Detected on an event recorder  . Coronary artery disease     Status post marginal stenting ejection fraction 45%  . Transient ischemic attack   . Echocardiogram abnormal     abnormal ejection fraction as per echo 26f 40-50%  . Emphysema   . PVC (premature ventricular contraction)   . Hypertension   . ACE inhibitor intolerance     Cough   Assessment: Mr. Keetch is a 79 year old male on chronic coumadin for atrial fibrillation. His INR is 2.99 on his home dose of 4mg  daily except 6mg  on MWF. Noted that patient has not been eating full means and has been on antibiotics in hospital that can increase INR.  Coumadin now to be managed per pharmacy.   Patient's CBC is unremarkable and no bleeding has been noted. His INR will likely trend up into the 3 range tomorrow.   Goal of Therapy:  INR 2-3 Monitor platelets by anticoagulation protocol: Yes   Plan:  Coumadin 1mg  po x 1 dose today Follow up daily PT/INR  Thank you,  Brett Fairy, PharmD, BCPS 02/08/2013 12:34 PM

## 2013-02-09 ENCOUNTER — Other Ambulatory Visit: Payer: Self-pay

## 2013-02-09 LAB — PROTIME-INR
INR: 3.03 — ABNORMAL HIGH (ref 0.00–1.49)
Prothrombin Time: 29.8 seconds — ABNORMAL HIGH (ref 11.6–15.2)

## 2013-02-09 MED ORDER — PREDNISONE 20 MG PO TABS
40.0000 mg | ORAL_TABLET | Freq: Every day | ORAL | Status: DC
  Administered 2013-02-10 – 2013-02-11 (×2): 40 mg via ORAL
  Filled 2013-02-09 (×4): qty 2

## 2013-02-09 MED ORDER — DILTIAZEM LOAD VIA INFUSION
10.0000 mg | Freq: Once | INTRAVENOUS | Status: AC
  Administered 2013-02-09: 10 mg via INTRAVENOUS
  Filled 2013-02-09: qty 10

## 2013-02-09 MED ORDER — ALBUTEROL SULFATE HFA 108 (90 BASE) MCG/ACT IN AERS
1.0000 | INHALATION_SPRAY | Freq: Four times a day (QID) | RESPIRATORY_TRACT | Status: DC
  Administered 2013-02-09: 1 via RESPIRATORY_TRACT
  Filled 2013-02-09: qty 6.7

## 2013-02-09 MED ORDER — WARFARIN SODIUM 4 MG PO TABS
4.0000 mg | ORAL_TABLET | Freq: Once | ORAL | Status: AC
  Administered 2013-02-09: 4 mg via ORAL
  Filled 2013-02-09: qty 1

## 2013-02-09 MED ORDER — ALBUTEROL SULFATE HFA 108 (90 BASE) MCG/ACT IN AERS: 2.0000 | INHALATION_SPRAY | RESPIRATORY_TRACT | Status: DC | PRN

## 2013-02-09 MED ORDER — DILTIAZEM HCL 90 MG PO TABS
90.0000 mg | ORAL_TABLET | Freq: Four times a day (QID) | ORAL | Status: DC
  Administered 2013-02-09: 90 mg via ORAL
  Filled 2013-02-09 (×5): qty 1

## 2013-02-09 MED ORDER — ALBUTEROL SULFATE HFA 108 (90 BASE) MCG/ACT IN AERS
2.0000 | INHALATION_SPRAY | Freq: Three times a day (TID) | RESPIRATORY_TRACT | Status: DC
  Administered 2013-02-09 – 2013-02-11 (×5): 2 via RESPIRATORY_TRACT

## 2013-02-09 MED ORDER — DILTIAZEM HCL 100 MG IV SOLR
5.0000 mg/h | INTRAVENOUS | Status: AC
  Administered 2013-02-09: 10 mg/h via INTRAVENOUS
  Administered 2013-02-09 – 2013-02-10 (×2): 5 mg/h via INTRAVENOUS
  Administered 2013-02-10 – 2013-02-11 (×3): 20 mg/h via INTRAVENOUS
  Filled 2013-02-09 (×8): qty 100

## 2013-02-09 NOTE — Progress Notes (Signed)
ANTICOAGULATION CONSULT NOTE - Initial Consult  Pharmacy Consult for Coumadin Indication: atrial fibrillation  No Known Allergies  Patient Measurements: Height: 5\' 8"  (172.7 cm) Weight: 160 lb 15 oz (73 kg) IBW/kg (Calculated) : 68.4  Vital Signs: Temp src: Oral (03/15 0532) BP: 120/71 mmHg (03/15 0532) Pulse Rate: 105 (03/15 0532)  Labs:  Recent Labs  02/07/13 0450 02/08/13 0525 02/09/13 0515  HGB 12.7* 12.3*  --   HCT 38.4* 37.2*  --   PLT 138* 150  --   LABPROT 23.1* 29.5* 29.8*  INR 2.15* 2.99* 3.03*  CREATININE 2.60* 2.33*  --     Estimated Creatinine Clearance: 24.9 ml/min (by C-G formula based on Cr of 2.33).   Assessment: Keith Larson is a 79 year old male on chronic coumadin for atrial fibrillation. INR 3.03 today after decreasing dose yesterday.   Goal of Therapy:  INR 2-3 Monitor platelets by anticoagulation protocol: Yes   Plan:  Coumadin 4mg  po x 1 dose today Follow up daily PT/INR  Keith Larson, PharmD, BCPS Clinical Pharmacist Pager [phone]   02/09/2013 11:26 AM

## 2013-02-09 NOTE — Progress Notes (Signed)
[facility] PROGRESS NOTE  Gilman Olazabal VHQ:[id number] DOB: Apr 11, 1932 DOA: 02/05/2013 PCP: Default, Provider, MD  Assessment/Plan: 1. COPD exacerbation - patient admitted with a moderate to severe COPD exacerbation, brought on  by an upper respiratory tract infection. Patient was admitted to step down unit, started on supplemental oxygen, started on IV steroids, and bronchodilators. Patient was also placed on IV azithromycin. By hospital day #2 the patient improved and was transferred out to the floor. On February 07, 2013 we have noted that the patient continued to make yellow-green sputum. Antibiotics were changed to high dose Levaquin. Azithromycin was discontinued. Patient continued to improve. He was started on spiriva and albuterol MDI and the steroids were changed to po. Nebs were discontinued on 02/09/13 . Tapering steroids. 2.  Atrial fibrillation on chronic coumadin - continued full dose anticoagulation in the hospital. Patient with episodes of atrial fibrillation with rapid ventricular response noted by HOD 2. Patient started on Cardizem 60 mg every 6 hours by mouth on 02/07/13 On 02/09/13 patient was found to be in Atrial flutter with sustained HR of 140s. Patient started on cardizem drip and transferred to 2000. Plan for DCCV prior to discharge as per Dr. Odessa Fleming consult note   3.  CAD s/p stent - patient without signs of acute coronary syndrome. Outpatient Myoview.  4.  CHF acute on chronic systolic and diastolic - proBNP - 5194. Since his been 3 years since her last echocardiogram we have ordered repeat study for this patient. We have treated the patient with IV furosemide of March 11 and March 12. Echo during this admission indicating preserved EF and diastolic dysfunction only.  5. Acute on chronic renal failure - baseline creatinine is 1.5 patient with CKD stage III. etiology is unclear-monitor renal function closely. Ua with minimal proteinuria. Patient on ARB PTA - will need close f/u at  Discharge  6. Constipation - LOC  Code Status: Full code Family Communication: patient  Disposition Plan: Home   Consultants:  [facility]   Procedures:  Echocardiogram  Study Conclusions  Left ventricle: The cavity size was normal. Wall thickness was increased in a pattern of mild LVH. Systolic function was normal. The estimated ejection fraction was in the range of 55% to 60%. Wall motion was normal; there were no regional wall motion abnormalities.     Antibiotics:  Azithromycin March 12   HPI/Subjective: Breathing better.  No chest pain  Denies palpitations   Objective: Filed Vitals:   02/09/13 1411 02/09/13 1503 02/09/13 1511 02/09/13 1519  BP: 115/58  115/59 116/44  Pulse: 85 72  73  Temp: 97.4 F (36.3 C) 97.1 F (36.2 C)    TempSrc: Oral Oral    Resp: 18 18    Height:      Weight:      SpO2: 92% 94%     Patient Vitals for the past 24 hrs:  BP Temp Temp src Pulse Resp SpO2  02/09/13 1519 116/44 mmHg - - 73 - -  02/09/13 1511 115/59 mmHg - - - - -  02/09/13 1503 - 97.1 F (36.2 C) Oral 72 18 94 %  02/09/13 1411 115/58 mmHg 97.4 F (36.3 C) Oral 85 18 92 %  02/09/13 0532 120/71 mmHg - Oral 105 18 92 %  02/08/13 2318 119/73 mmHg - - 142 20 92 %  02/08/13 2136 106/76 mmHg 97.8 F (36.6 C) Oral 133 20 94 %  02/08/13 2058 - - - 95 18 95 %  02/08/13 2041 - - - - -  94 %     Intake/Output Summary (Last 24 hours) at 02/09/13 1532 Last data filed at 02/09/13 1414  Gross per 24 hour  Intake    840 ml  Output   1300 ml  Net   -460 ml   Filed Weights   02/05/13 2145 02/08/13 0500  Weight: 68.8 kg (151 lb 10.8 oz) 73 kg (160 lb 15 oz)    Exam:   General:  Alert and oriented x3  Cardiovascular: iregular rhythm without murmurs  Respiratory: Bilateral wheezes, bilateral basilar crackles  Abdomen: Soft nontender bowel sounds are present  Musculoskeletal: Minimal lower extremity edema   Data Reviewed: Basic Metabolic  Panel:  Recent Labs Lab 02/05/13 1716 02/06/13 0857 02/07/13 0450 02/08/13 0525  NA 134* 138 138 138  K 4.5 4.4 4.3 4.7  CL 99 104 104 103  CO2 22 23 25 26   GLUCOSE 142* 219* 154* 184*  BUN 40* 46* 57* 65*  CREATININE 2.69* 2.84* 2.60* 2.33*  CALCIUM 9.1 8.9 9.0 8.8   Liver Function Tests: No results found for this basename: AST, ALT, ALKPHOS, BILITOT, PROT, ALBUMIN,  in the last 168 hours No results found for this basename: LIPASE, AMYLASE,  in the last 168 hours No results found for this basename: AMMONIA,  in the last 168 hours CBC:  Recent Labs Lab 02/05/13 1716 02/06/13 0857 02/07/13 0450 02/08/13 0525  WBC 8.3 7.5 12.9* 10.2  HGB 14.2 13.1 12.7* 12.3*  HCT 42.6 39.9 38.4* 37.2*  MCV 90.4 89.9 90.4 88.2  PLT 144* 124* 138* 150   Cardiac Enzymes:  Recent Labs Lab 02/05/13 1716 02/09/13 1220  TROPONINI <0.30 <0.30   BNP (last 3 results)  Recent Labs  02/05/13 1716  PROBNP 5194.0*   CBG:  Recent Labs Lab 02/07/13 2054  GLUCAP 133*    Recent Results (from the past 240 hour(s))  MRSA PCR SCREENING     Status: None   Collection Time    02/06/13  4:43 AM      Result Value Range Status   MRSA by PCR NEGATIVE  NEGATIVE Final   Comment:            The GeneXpert MRSA Assay (FDA     approved for NASAL specimens     only), is one component of a     comprehensive MRSA colonization     surveillance program. It is not     intended to diagnose MRSA     infection nor to guide or     monitor treatment for     MRSA infections.     Studies: No results found.  Scheduled Meds: . albuterol  2 puff Inhalation TID  . guaiFENesin  600 mg Oral BID  . levofloxacin (LEVAQUIN) IV  750 mg Intravenous Q48H  . [START ON 02/10/2013] predniSONE  40 mg Oral Q breakfast  . simvastatin  5 mg Oral q1800  . tiotropium  18 mcg Inhalation Daily  . warfarin  4 mg Oral ONCE-1800  . Warfarin - Pharmacist Dosing Inpatient   Does not apply q1800   Continuous Infusions: .  diltiazem (CARDIZEM) infusion 5 mg/hr (02/09/13 1517)    Principal Problem:   COPD exacerbation Active Problems:   Atrial fibrillation   Echocardiogram abnormal   Long term (current) use of anticoagulants   Cardiomyopathy, nonischemic   Coronary artery disease status post bare-metal stenting of the acute marginal   ACE inhibitor intolerance   Acute respiratory failure with hypoxia   H/O  sinus bradycardia   Renal failure, acute     Lisabeth Mian  [facility] Pager [phone]. If 7PM-7AM, please contact night-coverage at [email or web address], password [email or web address] 02/09/2013, 3:32 PM  LOS: 4 days

## 2013-02-09 NOTE — Progress Notes (Signed)
Patient seen by Dr Lavera Guise.  Sitting comfortably in recliner, has been oob at sink to wash up & to the bathroom.  SOB when oob with 02 # 2lpm but rebounds back to normal quickly.  Dr Lavera Guise notes that patient is now in atrial flutter, has hx of and has been in atrial fib with coumadin long term.  Patient is alert and oriented without c/o of discomfort or pain, skin intact except for a small skin tear to right upper arm.  IV NSL of right arm.  Orders for cardizem drip, d/c po Cardizem, EKG which was done and indicates atrial flutter and transfer to cardiac unit.  Bed obtained on  2000, report called to the RN and patient transferred without incident to 2000 with cardiac monitor.   Bonney Leitz RN

## 2013-02-09 NOTE — Consult Note (Signed)
ELECTROPHYSIOLOGY CONSULT NOTE  Patient ID: Keith Larson, MRN: [id number], DOB/AGE: 1932-08-11 79 y.o. Admit date: 02/05/2013 Date of Consult: 02/09/2013  Primary Physician: Default, Provider, MD Primary Cardiologist:  sk   Chief Complaint:  Atrial flutter   HPI Keith Larson is a 79 y.o. male  with a known history of atrial fibrillation and a prior TIA and known coronary disease with prior myocardial infarction with modest depression of LV EF of 40-50%. He was found to have an abnormal Myoview and underwent catheterization 4/12 demonstrating total occlusion of a large circumflex branch for which he underwent BMS placement.  He was seen last 9/12. There were concerns at that time regarding bradycardia. Since that time he's been getting his care at the [place].  He has a history of emphysema for which he uses inhalers. He was admitted on this occasion because of COPD exacerbation. He developed atrial fibrillation and atrial flutter with a rapid ventricular response.  He was in sinus on arrival. His INR was subtherapeutic initially but was therapeutic by the time atrial fibrillation developed 3/13. Last night he was extremely diaphoretic. He has had no tachycardia palpitations. Cardiac enzymes this morning are negative  Functional status at baseline has been deteriorating over the last 6-12 months and he has some days that are much worse than others. He has no associated palpitations, and in this regard it is worthy to note that he did not have palpitations in hospital with his documented atrial fibrillation and flutter. He denies chest discomfort; he has had no peripheral edema.  Echocardiogram this hospitalization demonstrated normal left ventricular function EF 55-60%; he had mild LVH  He has moderate renal insufficiency with a GFR of about 25  Past Medical History  Diagnosis Date  . Atrial fibrillation     Detected on an event recorder  . Coronary artery disease     Status post marginal stenting  ejection fraction 45%  . Transient ischemic attack   . Echocardiogram abnormal     abnormal ejection fraction as per echo 49f 40-50%  . Emphysema   . PVC (premature ventricular contraction)   . Hypertension   . ACE inhibitor intolerance     Cough      Surgical History:  Past Surgical History  Procedure Laterality Date  . Cholecystectomy       Home Meds: Prior to Admission medications   Medication Sig Start Date End Date Taking? Authorizing Provider  Acetaminophen (NON-ASPIRIN PAIN RELIEVER PO) Take 2 tablets by mouth every 4 (four) hours as needed (for pain).    Yes Historical Provider, MD  guaiFENesin (ROBITUSSIN) 100 MG/5ML liquid Take 200 mg by mouth 4 (four) times daily as needed for cough.   Yes Historical Provider, MD  losartan (COZAAR) 100 MG tablet Take 50 mg by mouth every morning.   Yes Historical Provider, MD  metoprolol (LOPRESSOR) 50 MG tablet Take 25 mg by mouth 2 (two) times daily.   Yes Historical Provider, MD  pravastatin (PRAVACHOL) 20 MG tablet Take 20 mg by mouth daily.   Yes Historical Provider, MD  PRESCRIPTION MEDICATION Apply 1 application topically 2 (two) times daily as needed.   Yes Historical Provider, MD  warfarin (COUMADIN) 2 MG tablet Take 4-6 mg by mouth every evening. Takes 2 tablets daily, except for Mon, Wed, Fri- takes 6mg .   Yes Historical Provider, MD    Inpatient Medications:  . albuterol  2 puff Inhalation TID  . aspirin EC  81 mg Oral Daily  . diltiazem  10 mg Intravenous Once  . guaiFENesin  600 mg Oral BID  . levofloxacin (LEVAQUIN) IV  750 mg Intravenous Q48H  . predniSONE  60 mg Oral Q breakfast  . simvastatin  5 mg Oral q1800  . tiotropium  18 mcg Inhalation Daily  . warfarin  4 mg Oral ONCE-1800  . Warfarin - Pharmacist Dosing Inpatient   Does not apply q1800     Allergies: No Known Allergies  History   Social History  . Marital Status: Single    Spouse Name: N/A    Number of Children: N/A  . Years of Education: N/A    Occupational History  . retired    Social History Main Topics  . Smoking status: Former Smoker -- 1.00 packs/day for 50 years    Quit date: 02/12/2004  . Smokeless tobacco: Never Used  . Alcohol Use: Yes     Comment: several alcoholic beverages a week  . Drug Use: No  . Sexually Active:    Other Topics Concern  . Not on file   Social History Narrative   Pt lives in [place] by himself.  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  He has 1 son who lives close by.  He has a greater the 50 pack-year smoking history but quit approximately 7 years ago.  He has several alcoholic beverages a week.  He denies any  Illicit drug use.      Family History  Problem Relation Age of Onset  . Diabetes Mother   . Heart disease Neg Hx   . Heart attack Neg Hx      ROS:  Please see the history of present illness.   Negative except Floaters, prostatism  All other systems reviewed and negative.    Physical Exam:   Blood pressure 115/58, pulse 85, temperature 97.4 F (36.3 C), temperature source Oral, resp. rate 18, height 5\' 8"  (1.727 m), weight 160 lb 15 oz (73 kg), SpO2 92.00%. General: Well developed, well nourished elderly age appearing male in no acute distress. Head: Normocephalic, atraumatic, sclera non-icteric, no xanthomas, nares are without discharge.  EENT-normal Lymph Nodes:  none Back: without scoliosis/kyphosis , no CVA tendersness Neck: Negative for carotid bruits. JVD not elevated. Lungs: Markedly decreased breath sounds bilateral wheezing Heart: Irregular rhythm with distant heart sounds unable to appreciate murmur , rubs, or gallops appreciated. Abdomen: Soft, non-tender, non-distended with normoactive bowel sounds. No hepatomegaly. No rebound/guarding. No obvious abdominal masses. Msk:  Strength and tone appear normal for age. Extremities: No clubbing or cyanosis. No  edema.  Distal pedal pulses are 2+ and equal bilaterally. Skin: Warm and  Dry Neuro: Alert and oriented X 3. CN III-XII intact Grossly normal sensory and motor function . Psych:  Responds to questions appropriately with a normal affect.      Labs: Cardiac Enzymes  Recent Labs  02/09/13 1220  TROPONINI <0.30   CBC Lab Results  Component Value Date   WBC 10.2 02/08/2013   HGB 12.3* 02/08/2013   HCT 37.2* 02/08/2013   MCV 88.2 02/08/2013   PLT 150 02/08/2013   PROTIME:  Recent Labs  02/07/13 0450 02/08/13 0525 02/09/13 0515  LABPROT 23.1* 29.5* 29.8*  INR 2.15* 2.99* 3.03*   Chemistry  Recent Labs Lab 02/08/13 0525  NA 138  K 4.7  CL 103  CO2 26  BUN 65*  CREATININE 2.33*  CALCIUM 8.8  GLUCOSE 184*   Lipids Lab Results  Component Value  Date   CHOL  Value: 132        ATP III CLASSIFICATION:  <200     mg/dL   Desirable  161-096  mg/dL   Borderline High  >=045    mg/dL   High        40/07/8118   HDL 35* 11/04/2010   LDLCALC  Value: 89        Total Cholesterol/HDL:CHD Risk Coronary Heart Disease Risk Table                     Men   Women  1/2 Average Risk   3.4   3.3  Average Risk       5.0   4.4  2 X Average Risk   9.6   7.1  3 X Average Risk  23.4   11.0        Use the calculated Patient Ratio above and the CHD Risk Table to determine the patient's CHD Risk.        ATP III CLASSIFICATION (LDL):  <100     mg/dL   Optimal  147-829  mg/dL   Near or Above                    Optimal  130-159  mg/dL   Borderline  562-130  mg/dL   High  >865     mg/dL   Very High 78/02/6961   TRIG 42 11/04/2010   BNP Pro B Natriuretic peptide (BNP)  Date/Time Value Range Status  02/05/2013  5:16 PM 5194.0* 0 - 450 pg/mL Final   Miscellaneous Lab Results  Component Value Date   DDIMER  Value: 0.44        AT THE INHOUSE ESTABLISHED CUTOFF VALUE OF 0.48 ug/mL FEU, THIS ASSAY HAS BEEN DOCUMENTED IN THE LITERATURE TO HAVE A SENSITIVITY AND NEGATIVE PREDICTIVE VALUE OF AT LEAST 98 TO 99%.  THE TEST RESULT SHOULD BE CORRELATED WITH AN ASSESSMENT OF THE CLINICAL PROBABILITY  OF DVT / VTE. 11/03/2010    Radiology/Studies:  Dg Chest Portable 1 View  02/05/2013  *RADIOLOGY REPORT*  Clinical Data: Shortness of breath, wheezing  PORTABLE CHEST - 1 VIEW  Comparison: 11/03/2010  Findings: Moderate enlargement of the cardiomediastinal silhouette noted with central vascular prominence but no overt edema.  No pleural effusion.  No new pulmonary opacity.  Diffusely prominent interstitial markings are noted, increased since the prior exam.  IMPRESSION: Cardiomegaly mild central vascular congestion and possible early interstitial edema.   Original Report Authenticated By: Christiana Pellant, M.D.     EKG: 02/05/13 sinus rhythm 1:15 intervals 15/08/32 Rare PVC ECG 02/07/13  probable sinus tach at 135 ECG 02/07/13 1300 hours atrial fibrillation ECG 02/09/13 1327 hours atrial flutter with controlled ventricular response   Assessment and Plan:    Principal Problem:   COPD exacerbation Active Problems:   Atrial fibrillation/flutter-RVR   Long term (current) use of anticoagulants   Coronary artery disease status post bare-metal stenting of the acute marginal   H/O sinus bradycardia   Renal failure-class 4   The patient presented with a COPD exacerbation in sinus tachycardia. He's developed atrial fibrillation and atrial flutter  With intermittently  fast ventricular rates.  He has a history of sinus bradycardia in the past although we have not seen at during this hospitalization.   His variable exercise tolerance described at home potentially could be related to atrial arrhythmias with rapid rates, sinus rhythm with chronotropic incompetence or progressive coronary  disease  Recommendations 1-discontinue aspirin 2-continue diltiazem 3-aggressive management of his COPD 4-consider DC cardioversion prior to discharge home. I should note that his INR while it was low on arrival was therapeutic by the time he went out of rhythm. 5-consider outpatient event recorder to try to associate  his symptoms with rhythm and or rate 6-outpatient Myoview once his pulmonary status is stable  Sherryl Manges

## 2013-02-10 LAB — BASIC METABOLIC PANEL
BUN: 66 mg/dL — ABNORMAL HIGH (ref 6–23)
CO2: 29 mEq/L (ref 19–32)
Chloride: 102 mEq/L (ref 96–112)
Creatinine, Ser: 2.16 mg/dL — ABNORMAL HIGH (ref 0.50–1.35)
Potassium: 4.9 mEq/L (ref 3.5–5.1)

## 2013-02-10 LAB — PROTIME-INR: INR: 3.35 — ABNORMAL HIGH (ref 0.00–1.49)

## 2013-02-10 LAB — CBC
HCT: 36.7 % — ABNORMAL LOW (ref 39.0–52.0)
MCV: 88.9 fL (ref 78.0–100.0)
Platelets: 168 10*3/uL (ref 150–400)
RBC: 4.13 MIL/uL — ABNORMAL LOW (ref 4.22–5.81)
WBC: 10.4 10*3/uL (ref 4.0–10.5)

## 2013-02-10 MED ORDER — WARFARIN SODIUM 1 MG PO TABS
1.0000 mg | ORAL_TABLET | Freq: Once | ORAL | Status: AC
  Administered 2013-02-10: 1 mg via ORAL
  Filled 2013-02-10: qty 1

## 2013-02-10 NOTE — Progress Notes (Signed)
Patient ID: Keith Larson, male   DOB: [birth year], 79 y.o.   MRN: [id number]   [name] Date of Encounter: 02/10/2013    SUBJECTIVE  Still coughing up sputum but has cleared. He is going in and out of A. Fib with a rapid rate. He is very disturbed by this though it's not that symptomatic. Hemodynamics have been fairly stable. See Dr. Odessa Fleming extensive note yesterday. He is on IV diltiazem 15.  CURRENT MEDS . albuterol  2 puff Inhalation TID  . guaiFENesin  600 mg Oral BID  . levofloxacin (LEVAQUIN) IV  750 mg Intravenous Q48H  . predniSONE  40 mg Oral Q breakfast  . simvastatin  5 mg Oral q1800  . tiotropium  18 mcg Inhalation Daily  . Warfarin - Pharmacist Dosing Inpatient   Does not apply q1800    OBJECTIVE  Filed Vitals:   02/10/13 0409 02/10/13 0810 02/10/13 0818 02/10/13 0830  BP: 124/70 144/77 135/83 137/85  Pulse: 98 155 150 150  Temp: 98 F (36.7 C)     TempSrc: Oral     Resp: 18     Height:      Weight:      SpO2: 96%       Intake/Output Summary (Last 24 hours) at 02/10/13 0944 Last data filed at 02/10/13 0411  Gross per 24 hour  Intake    720 ml  Output   1725 ml  Net  -1005 ml   Filed Weights   02/05/13 2145 02/08/13 0500  Weight: 151 lb 10.8 oz (68.8 kg) 160 lb 15 oz (73 kg)    PHYSICAL EXAM  General: Pleasant, NAD. Neuro: Alert and oriented X 3. Moves all extremities spontaneously. Psych: Normal affect. HEENT:  Normal  Neck: Supple without bruits or JVD. Lungs:  Resp regular and unlabored, inspiratory expiratory wheezes Heart: RRno s3, s4, or murmurs. Abdomen: Soft, non-tender, non-distended, BS + x 4.  Extremities: No clubbing, cyanosis or edema. DP/PT/Radials 2+ and equal bilaterally.  Accessory Clinical Findings  CBC  Recent Labs  02/08/13 0525 02/10/13 0520  WBC 10.2 10.4  HGB 12.3* 11.9*  HCT 37.2* 36.7*  MCV 88.2 88.9  PLT 150 168   Basic Metabolic Panel  Recent Labs  02/08/13 0525 02/10/13 0520  NA 138 138    K 4.7 4.9  CL 103 102  CO2 26 29  GLUCOSE 184* 137*  BUN 65* 66*  CREATININE 2.33* 2.16*  CALCIUM 8.8 8.8   Liver Function Tests No results found for this basename: AST, ALT, ALKPHOS, BILITOT, PROT, ALBUMIN,  in the last 72 hours No results found for this basename: LIPASE, AMYLASE,  in the last 72 hours Cardiac Enzymes  Recent Labs  02/09/13 1220  TROPONINI <0.30   BNP No components found with this basename: POCBNP,  D-Dimer No results found for this basename: DDIMER,  in the last 72 hours Hemoglobin A1C No results found for this basename: HGBA1C,  in the last 72 hours Fasting Lipid Panel No results found for this basename: CHOL, HDL, LDLCALC, TRIG, CHOLHDL, LDLDIRECT,  in the last 72 hours Thyroid Function Tests No results found for this basename: TSH, T4TOTAL, FREET3, T3FREE, THYROIDAB,  in the last 72 hours  TELE  A. Fib with a rapid ventricular rate, there seems to be periods of atrial flutter as well.  ECG    Radiology/Studies  Dg Chest Portable 1 View  02/05/2013  *RADIOLOGY REPORT*  Clinical Data: Shortness of breath, wheezing  PORTABLE CHEST -  1 VIEW  Comparison: 11/03/2010  Findings: Moderate enlargement of the cardiomediastinal silhouette noted with central vascular prominence but no overt edema.  No pleural effusion.  No new pulmonary opacity.  Diffusely prominent interstitial markings are noted, increased since the prior exam.  IMPRESSION: Cardiomegaly mild central vascular congestion and possible early interstitial edema.   Original Report Authenticated By: Christiana Pellant, M.D.     ASSESSMENT AND PLAN  Principal Problem:   COPD exacerbation Active Problems:   Atrial fibrillation   Echocardiogram abnormal   Long term (current) use of anticoagulants   Cardiomyopathy, nonischemic   Coronary artery disease status post bare-metal stenting of the acute marginal   ACE inhibitor intolerance   Acute respiratory failure with hypoxia   H/O sinus  bradycardia   Renal failure, acute    Will increase diltiazem to 20 mg. Will hold n.p.o. For possible cardioversion tomorrow. Dr. Lavera Guise feels her COPD is much better than when he was admitted. Note Dr. Koren Bound mention of his INR being therapeutic when he went into A. Fib flutter.  Signed, Valera Castle MD

## 2013-02-10 NOTE — Progress Notes (Addendum)
Pt with HR sustaining in the 150's.  Cardizem gtt increased to 10 mg/hr per order.  VS currently being monitored every 15 minutes.  Pt asymptomatic and resting comfortably.  Will continue to monitor.  Arlin Savona, Md [facility]

## 2013-02-10 NOTE — Progress Notes (Signed)
[facility] PROGRESS NOTE  Keith Larson WUX:[id number] DOB: 1932/02/11 DOA: 02/05/2013 PCP: Default, Provider, MD  Assessment/Plan: 1. COPD exacerbation - patient admitted with a moderate to severe COPD exacerbation, brought on  by an upper respiratory tract infection. Patient was admitted to step down unit, started on supplemental oxygen, started on IV steroids, and bronchodilators. Patient was also placed on IV azithromycin. By hospital day #2 the patient improved and was transferred out to the floor. On February 07, 2013 we have noted that the patient continued to make yellow-green sputum. Antibiotics were changed to high dose Levaquin. Azithromycin was discontinued. Patient continued to improve. He was started on spiriva and albuterol MDI and the steroids were changed to po. Nebs were discontinued on 02/09/13 . Tapering steroids. 2.  Atrial fibrillation on chronic coumadin - continued full dose anticoagulation in the hospital. Patient with episodes of atrial fibrillation with rapid ventricular response noted by HOD 2. Patient started on Cardizem 60 mg every 6 hours by mouth on 02/07/13 On 02/09/13 patient was found to be in Atrial flutter with sustained HR of 140s. Patient started on cardizem drip and transferred to 2000. Plan for DCCV prior to discharge.   3.  CAD s/p stent - patient without signs of acute coronary syndrome. Outpatient Myoview. Troponin I <0.30 on admission and on 3/15  4.  CHF acute on chronic systolic and diastolic - proBNP - 5194. Since his been 3 years since her last echocardiogram we have ordered repeat study for this patient. We have treated the patient with IV furosemide of March 11 and March 12. Echo during this admission indicating preserved EF and diastolic dysfunction only.  5. Acute on chronic renal failure - baseline creatinine is 1.5 patient with CKD stage III. etiology is unclear-monitor renal function closely. Ua with minimal proteinuria. Patient on ARB PTA - will need  close f/u at Discharge  6. Constipation - LOC  Code Status: Full code Family Communication: patient  Disposition Plan: Home   Consultants:  [facility]   Procedures:  Echocardiogram  Study Conclusions  Left ventricle: The cavity size was normal. Wall thickness was increased in a pattern of mild LVH. Systolic function was normal. The estimated ejection fraction was in the range of 55% to 60%. Wall motion was normal; there were no regional wall motion abnormalities.     Antibiotics:  Azithromycin March 12 -3/13  Levaquin 3/13 -  HPI/Subjective:   Objective: Filed Vitals:   02/10/13 0810 02/10/13 0818 02/10/13 0830 02/10/13 0952  BP: 144/77 135/83 137/85 144/82  Pulse: 155 150 150 150  Temp:      TempSrc:      Resp:      Height:      Weight:      SpO2:       Patient Vitals for the past 24 hrs:  BP Temp Temp src Pulse Resp SpO2  02/10/13 0952 144/82 mmHg - - 150 - -  02/10/13 0830 137/85 mmHg - - 150 - -  02/10/13 0818 135/83 mmHg - - 150 - -  02/10/13 0810 144/77 mmHg - - 155 - -  02/10/13 0409 124/70 mmHg 98 F (36.7 C) Oral 98 18 96 %  02/09/13 2135 - - - 61 18 95 %  02/09/13 2009 116/60 mmHg 98.1 F (36.7 C) Oral 74 19 96 %  02/09/13 1738 119/69 mmHg - - 81 - -  02/09/13 1557 126/88 mmHg - - - - 95 %  02/09/13 1541 121/63 mmHg - -  72 - 96 %  02/09/13 1531 124/58 mmHg - - 75 - 93 %  02/09/13 1519 116/44 mmHg - - 73 - -  02/09/13 1511 115/59 mmHg - - - - -  02/09/13 1503 - 97.1 F (36.2 C) Oral 72 18 94 %  02/09/13 1411 115/58 mmHg 97.4 F (36.3 C) Oral 85 18 92 %     Intake/Output Summary (Last 24 hours) at 02/10/13 1038 Last data filed at 02/10/13 0411  Gross per 24 hour  Intake    480 ml  Output   1525 ml  Net  -1045 ml   Filed Weights   02/05/13 2145 02/08/13 0500  Weight: 68.8 kg (151 lb 10.8 oz) 73 kg (160 lb 15 oz)    Exam:   General:  Alert and oriented x3  Cardiovascular: iregular rhythm without  murmurs  Respiratory: Bilateral wheezes, bilateral basilar crackles  Abdomen: Soft nontender bowel sounds are present  Musculoskeletal: Minimal lower extremity edema   Data Reviewed: Basic Metabolic Panel:  Recent Labs Lab 02/05/13 1716 02/06/13 0857 02/07/13 0450 02/08/13 0525 02/10/13 0520  NA 134* 138 138 138 138  K 4.5 4.4 4.3 4.7 4.9  CL 99 104 104 103 102  CO2 22 23 25 26 29   GLUCOSE 142* 219* 154* 184* 137*  BUN 40* 46* 57* 65* 66*  CREATININE 2.69* 2.84* 2.60* 2.33* 2.16*  CALCIUM 9.1 8.9 9.0 8.8 8.8   Liver Function Tests: No results found for this basename: AST, ALT, ALKPHOS, BILITOT, PROT, ALBUMIN,  in the last 168 hours No results found for this basename: LIPASE, AMYLASE,  in the last 168 hours No results found for this basename: AMMONIA,  in the last 168 hours CBC:  Recent Labs Lab 02/05/13 1716 02/06/13 0857 02/07/13 0450 02/08/13 0525 02/10/13 0520  WBC 8.3 7.5 12.9* 10.2 10.4  HGB 14.2 13.1 12.7* 12.3* 11.9*  HCT 42.6 39.9 38.4* 37.2* 36.7*  MCV 90.4 89.9 90.4 88.2 88.9  PLT 144* 124* 138* 150 168   Cardiac Enzymes:  Recent Labs Lab 02/05/13 1716 02/09/13 1220  TROPONINI <0.30 <0.30   BNP (last 3 results)  Recent Labs  02/05/13 1716  PROBNP 5194.0*   CBG:  Recent Labs Lab 02/07/13 2054  GLUCAP 133*    Recent Results (from the past 240 hour(s))  MRSA PCR SCREENING     Status: None   Collection Time    02/06/13  4:43 AM      Result Value Range Status   MRSA by PCR NEGATIVE  NEGATIVE Final   Comment:            The GeneXpert MRSA Assay (FDA     approved for NASAL specimens     only), is one component of a     comprehensive MRSA colonization     surveillance program. It is not     intended to diagnose MRSA     infection nor to guide or     monitor treatment for     MRSA infections.     Studies: No results found.  Scheduled Meds: . albuterol  2 puff Inhalation TID  . guaiFENesin  600 mg Oral BID  . levofloxacin  (LEVAQUIN) IV  750 mg Intravenous Q48H  . predniSONE  40 mg Oral Q breakfast  . simvastatin  5 mg Oral q1800  . tiotropium  18 mcg Inhalation Daily  . Warfarin - Pharmacist Dosing Inpatient   Does not apply q1800   Continuous Infusions: . diltiazem (  CARDIZEM) infusion 20 mg/hr (02/10/13 0951)    Principal Problem:   COPD exacerbation Active Problems:   Atrial fibrillation   Echocardiogram abnormal   Long term (current) use of anticoagulants   Cardiomyopathy, nonischemic   Coronary artery disease status post bare-metal stenting of the acute marginal   ACE inhibitor intolerance   Acute respiratory failure with hypoxia   H/O sinus bradycardia   Renal failure, acute     Keith Larson  [facility] Pager [phone]. If 7PM-7AM, please contact night-coverage at [email or web address], password [facility] 02/10/2013, 10:38 AM  LOS: 5 days

## 2013-02-10 NOTE — Progress Notes (Signed)
Pt continues to have elevated HR in 150's.  Pt is asymptomatic.  Cardizem increased to 15 mg/ml per titration order.  Will continue to monitor VS closely.  Sully Manzi, [facility]

## 2013-02-10 NOTE — Progress Notes (Signed)
ANTICOAGULATION CONSULT NOTE - Initial Consult  Pharmacy Consult for Coumadin Indication: atrial fibrillation  No Known Allergies  Patient Measurements: Height: 5\' 8"  (172.7 cm) Weight: 160 lb 15 oz (73 kg) IBW/kg (Calculated) : 68.4  Vital Signs: Temp: 98 F (36.7 C) (03/16 0409) Temp src: Oral (03/16 0409) BP: 144/82 mmHg (03/16 0952) Pulse Rate: 150 (03/16 0952)  Labs:  Recent Labs  02/08/13 0525 02/09/13 0515 02/09/13 1220 02/10/13 0520  HGB 12.3*  --   --  11.9*  HCT 37.2*  --   --  36.7*  PLT 150  --   --  168  LABPROT 29.5* 29.8*  --  32.1*  INR 2.99* 3.03*  --  3.35*  CREATININE 2.33*  --   --  2.16*  TROPONINI  --   --  <0.30  --     Estimated Creatinine Clearance: 26.8 ml/min (by C-G formula based on Cr of 2.16).   Assessment: Keith Larson is a 79 year old male on chronic coumadin for atrial fibrillation. INR 3.35 today   Goal of Therapy:  INR 2-3 Monitor platelets by anticoagulation protocol: Yes   Plan:  Coumadin 1mg  po today Follow up daily PT/INR  Talbert Cage, PharmD Clinical Pharmacist Pager [phone]   02/10/2013 12:22 PM

## 2013-02-11 ENCOUNTER — Inpatient Hospital Stay (HOSPITAL_COMMUNITY): Payer: Medicare Other | Admitting: Anesthesiology

## 2013-02-11 ENCOUNTER — Encounter (HOSPITAL_COMMUNITY): Payer: Self-pay | Admitting: Anesthesiology

## 2013-02-11 DIAGNOSIS — I4891 Unspecified atrial fibrillation: Secondary | ICD-10-CM

## 2013-02-11 LAB — PROTIME-INR
INR: 2.83 — ABNORMAL HIGH (ref 0.00–1.49)
Prothrombin Time: 29.9 seconds — ABNORMAL HIGH (ref 11.6–15.2)

## 2013-02-11 LAB — BASIC METABOLIC PANEL
BUN: 57 mg/dL — ABNORMAL HIGH (ref 6–23)
Chloride: 104 mEq/L (ref 96–112)
GFR calc Af Amer: 35 mL/min — ABNORMAL LOW (ref >90)
Potassium: 4.6 mEq/L (ref 3.5–5.1)
Sodium: 138 mEq/L (ref 135–145)

## 2013-02-11 MED ORDER — LIDOCAINE HCL (CARDIAC) 20 MG/ML IV SOLN
INTRAVENOUS | Status: DC | PRN
  Administered 2013-02-11: 30 mg via INTRAVENOUS

## 2013-02-11 MED ORDER — ALBUTEROL SULFATE HFA 108 (90 BASE) MCG/ACT IN AERS: 2.0000 | INHALATION_SPRAY | Freq: Two times a day (BID) | RESPIRATORY_TRACT | Status: AC

## 2013-02-11 MED ORDER — WARFARIN SODIUM 4 MG PO TABS
4.0000 mg | ORAL_TABLET | Freq: Once | ORAL | Status: DC
  Filled 2013-02-11: qty 1

## 2013-02-11 MED ORDER — DILTIAZEM HCL ER COATED BEADS 240 MG PO CP24: 240.0000 mg | ORAL_CAPSULE | Freq: Every day | ORAL | Status: DC

## 2013-02-11 MED ORDER — LEVOFLOXACIN 750 MG PO TABS: 750.0000 mg | ORAL_TABLET | ORAL | Status: DC

## 2013-02-11 MED ORDER — DILTIAZEM HCL ER COATED BEADS 240 MG PO CP24
240.0000 mg | ORAL_CAPSULE | Freq: Every day | ORAL | Status: DC
  Administered 2013-02-11: 240 mg via ORAL
  Filled 2013-02-11: qty 1

## 2013-02-11 MED ORDER — SODIUM CHLORIDE 0.9 % IV SOLN: INTRAVENOUS | Status: DC

## 2013-02-11 MED ORDER — SODIUM CHLORIDE 0.9 % IJ SOLN: 3.0000 mL | Freq: Two times a day (BID) | INTRAMUSCULAR | Status: DC

## 2013-02-11 MED ORDER — PREDNISONE 20 MG PO TABS: 40.0000 mg | ORAL_TABLET | Freq: Every day | ORAL | Status: AC

## 2013-02-11 MED ORDER — ALBUTEROL SULFATE HFA 108 (90 BASE) MCG/ACT IN AERS: 2.0000 | INHALATION_SPRAY | Freq: Two times a day (BID) | RESPIRATORY_TRACT | Status: DC

## 2013-02-11 MED ORDER — PROPOFOL 10 MG/ML IV BOLUS
INTRAVENOUS | Status: DC | PRN
  Administered 2013-02-11: 40 mg via INTRAVENOUS

## 2013-02-11 MED ORDER — TIOTROPIUM BROMIDE MONOHYDRATE 18 MCG IN CAPS: 18.0000 ug | ORAL_CAPSULE | Freq: Every day | RESPIRATORY_TRACT | Status: AC

## 2013-02-11 MED ORDER — CALCIUM CARBONATE ANTACID 500 MG PO CHEW: 1.0000 | CHEWABLE_TABLET | Freq: Two times a day (BID) | ORAL | Status: DC

## 2013-02-11 MED ORDER — SODIUM CHLORIDE 0.9 % IV SOLN: 250.0000 mL | INTRAVENOUS | Status: DC

## 2013-02-11 MED ORDER — SODIUM CHLORIDE 0.9 % IJ SOLN: 3.0000 mL | INTRAMUSCULAR | Status: DC | PRN

## 2013-02-11 NOTE — Care Management Note (Signed)
    Page 1 of 1   02/11/2013     2:55:58 PM   CARE MANAGEMENT NOTE 02/11/2013  Patient:  [facility]   Account Number:  [id number]  Date Initiated:  02/11/2013  Documentation initiated by:  Junius Creamer  Subjective/Objective Assessment:   adm w copd exacerb     Action/Plan:   lives alone   DC Planning Services  CM consult      Choice offered to / List presented to:  C-1 Patient       HH arranged  HH-1 RN  HH-2 PT      HH agency  [facility]   Status of service:  Completed, signed off Medicare Important Message given?  YES (If response is "NO", the following Medicare IM given date fields will be blank) Date Medicare IM given:  02/11/2013  Discharge Disposition:  HOME W HOME HEALTH SERVICES  Per UR Regulation:  Reviewed for med. necessity/level of care/duration of stay  Comments:  02/11/13 1021 Arriyana Rodell RN BSN MSN CCM Off the unit for DCCV. 1438 Pt to d/c home with home health RN and PT.  Provided list of agencies to pt, referral made per choice.

## 2013-02-11 NOTE — Anesthesia Preprocedure Evaluation (Signed)
Anesthesia Evaluation  Patient identified by MRN, date of birth, ID band Patient awake    Reviewed: Allergy & Precautions, H&P , NPO status , Patient's Chart, lab work & pertinent test results  Airway       Dental   Pulmonary COPD         Cardiovascular hypertension, + CAD and + Cardiac Stents + dysrhythmias Atrial Fibrillation Rhythm:irregular     Neuro/Psych TIA   GI/Hepatic   Endo/Other    Renal/GU Renal InsufficiencyRenal disease     Musculoskeletal   Abdominal   Peds  Hematology   Anesthesia Other Findings   Reproductive/Obstetrics                           Anesthesia Physical Anesthesia Plan  ASA: III  Anesthesia Plan: General   Post-op Pain Management:    Induction: Intravenous  Airway Management Planned: Mask  Additional Equipment:   Intra-op Plan:   Post-operative Plan:   Informed Consent: I have reviewed the patients History and Physical, chart, labs and discussed the procedure including the risks, benefits and alternatives for the proposed anesthesia with the patient or authorized representative who has indicated his/her understanding and acceptance.     Plan Discussed with: CRNA, Anesthesiologist and Surgeon  Anesthesia Plan Comments:         Anesthesia Quick Evaluation

## 2013-02-11 NOTE — Preoperative (Signed)
Beta Blockers   Reason not to administer Beta Blockers:Not Applicable 

## 2013-02-11 NOTE — Anesthesia Postprocedure Evaluation (Signed)
  Anesthesia Post-op Note  Patient: Keith Larson  Procedure(s) Performed: * No procedures listed *  Patient Location: Nursing Unit 2035  Anesthesia Type:General  Level of Consciousness: awake, alert  and oriented  Airway and Oxygen Therapy: Patient Spontanous Breathing and Patient connected to nasal cannula oxygen  Post-op Pain: none  Post-op Assessment: Post-op Vital signs reviewed, Patient's Cardiovascular Status Stable, Respiratory Function Stable, Patent Airway and No signs of Nausea or vomiting  Post-op Vital Signs: Reviewed and stable  Complications: No apparent anesthesia complications

## 2013-02-11 NOTE — Transfer of Care (Signed)
Immediate Anesthesia Transfer of Care Note  Patient: Keith Larson  Procedure(s) Performed: * No procedures listed *  Patient Location: Nursing Unit 2035  Anesthesia Type:General  Level of Consciousness: awake, alert  and oriented  Airway & Oxygen Therapy: Patient Spontanous Breathing and Patient connected to nasal cannula oxygen  Post-op Assessment: Post -op Vital signs reviewed and stable and Patient moving all extremities, report to RN 2035  Post vital signs: Reviewed and stable  Complications: No apparent anesthesia complications

## 2013-02-11 NOTE — Progress Notes (Signed)
ANTICOAGULATION CONSULT NOTE - follow up  Pharmacy Consult for Coumadin Indication: atrial fibrillation  No Known Allergies  Patient Measurements: Height: 5\' 8"  (172.7 cm) Weight: 160 lb 15 oz (73 kg) IBW/kg (Calculated) : 68.4  Vital Signs: Temp: 98.4 F (36.9 C) (03/17 0430) Temp src: Oral (03/17 0430) BP: 121/67 mmHg (03/17 0430) Pulse Rate: 84 (03/17 0430)  Labs:  Recent Labs  02/09/13 0515 02/09/13 1220 02/10/13 0520 02/11/13 0535  HGB  --   --  11.9*  --   HCT  --   --  36.7*  --   PLT  --   --  168  --   LABPROT 29.8*  --  32.1* 29.9*  INR 3.03*  --  3.35* 3.05*  CREATININE  --   --  2.16*  --   TROPONINI  --  <0.30  --   --     Estimated Creatinine Clearance: 26.8 ml/min (by C-G formula based on Cr of 2.16).   Assessment: Mr. Ruggieri is a 79 year old male on chronic coumadin for atrial fibrillation.  Home dose 4mg  daily except 6 mg on MWF.  INR 3.05 today after 6-4-1-4-1 mg doses.  To have DCCV today.  Levaquin # 5 - may increase INR.    Goal of Therapy:  INR 2-3    Plan:  1. Coumadin 4 mg po x 1 dose today 2. Follow up daily PT/INR 3. Change IV levaquin to po - 750 po q48, next dose 3/17 pm Herby Abraham, Pharm.D. [phone] 02/11/2013 9:59 AM

## 2013-02-11 NOTE — Progress Notes (Signed)
Pt remains in SR. Order received to D/C cardizem drip, switch to PO Cardizem, and D/C pt.

## 2013-02-11 NOTE — Discharge Summary (Signed)
Physician Discharge Summary  Keith Larson ZOX:[id number] DOB: [birth year] DOA: 02/05/2013  [facility] : Dr. Graciela Husbands   Admit date: 02/05/2013 Discharge date: 02/11/2013  Time spent: 45 minutes  Recommendations for Outpatient Follow-up:  1. Check PT/INR as before 2. F/u with Dr. Graciela Husbands   Discharge Diagnoses:    COPD exacerbation - improved    Atrial fibrillation and atrial flutter s/p DCCV    Long term (current) use of anticoagulants   Cardiomyopathy, nonischemic   Coronary artery disease status post bare-metal stenting of the acute marginal   ACE inhibitor intolerance   Acute respiratory failure with hypoxia   H/O sinus bradycardia   Renal failure, acute   Discharge Condition: fair  Diet recommendation: heart healthy   Filed Weights   02/05/13 2145 02/08/13 0500  Weight: 68.8 kg (151 lb 10.8 oz) 73 kg (160 lb 15 oz)    History of present illness:  79 yo male h/o emphysema comes in with 3 days of progressively worsening sob and wheezing. No fevers. Coughing. No n/v/d. No le edema or swelling. No cp. No abd pain. Still smokes. Has received several nebs in the ED with solumedrol and feels much better. No prior h/o chf but does have decreased lv function and cad/stents. No nasal congestion or myalgias.   Hospital Course:  1. COPD exacerbation - patient admitted with a moderate to severe COPD exacerbation, brought on by an upper respiratory tract infection. Patient was admitted to step down unit, started on supplemental oxygen, started on IV steroids, and bronchodilators. Patient was also placed on IV azithromycin. By hospital day #2 the patient improved and was transferred out to the floor. On February 07, 2013 we have noted that the patient continued to have yellow-green sputum. Antibiotics were changed to high dose Levaquin. Azithromycin was discontinued. Patient continued to improve. He was started on spiriva and albuterol MDI and the steroids were changed to po. Nebs were  discontinued on 02/09/13 . Tapering steroids. Atrial fibrillation on chronic coumadin - continued full dose anticoagulation in the hospital. Patient with episodes of atrial fibrillation with rapid ventricular response noted by HOD 2. Patient started on Cardizem 60 mg every 6 hours by mouth on 02/07/13 On 02/09/13 patient was found to be in Atrial flutter with sustained HR of 140s. Patient started on cardizem drip and transferred to 2000. He underwent DCCV prior to discharge on 02/11/13. Plan to continue po cardized 240 daily and metoprolol 25 BID and have patient follow up with Dr. Graciela Husbands from [facility] 3. CAD s/p stent - patient without signs of acute coronary syndrome. Troponin I <0.30 on admission and on 3/15 . C/w aspirin 81 mg daily  4. CHF acute on chronic systolic and diastolic - proBNP - 5194. Since his been 3 years since her last echocardiogram we have ordered repeat study for this patient. We have treated the patient with IV furosemide of March 11 and March 12. Echo during this admission indicating preserved EF and diastolic dysfunction only.  5. Acute on chronic renal failure - baseline creatinine is 1.5 patient with CKD stage III. etiology is unclear-monitor renal function closely. Ua with minimal proteinuria. Patient on ARB PTA - will need close f/u at Discharge  6. Constipation - LOC   Procedures: DCCV   Consultations:  [facility]   Discharge Exam: Filed Vitals:   02/10/13 1300 02/10/13 2017 02/11/13 0430 02/11/13 0810  BP: 141/74 132/78 121/67   Pulse: 101 53 84   Temp: 98.3 F (36.8 C)  98.3 F (36.8 C) 98.4 F (36.9 C)   TempSrc: Oral Oral Oral   Resp: 17 17 18    Height:      Weight:      SpO2: 92% 95% 95% 94%    General: axox3 Cardiovascular: rrr, no m,r,g  Respiratory: minimal crackles, no wheezes   Discharge Instructions  Discharge Orders   Future Orders Complete By Expires     Diet - low sodium heart healthy  As directed     Increase activity slowly  As  directed         Medication List    STOP taking these medications       losartan 100 MG tablet  Commonly known as:  COZAAR     PRESCRIPTION MEDICATION      TAKE these medications       albuterol 108 (90 BASE) MCG/ACT inhaler  Commonly known as:  PROVENTIL HFA;VENTOLIN HFA  Inhale 2 puffs into the lungs 2 (two) times daily.     calcium carbonate 500 MG chewable tablet  Commonly known as:  TUMS - dosed in mg elemental calcium  Chew 1 tablet (200 mg of elemental calcium total) by mouth 2 (two) times daily.     diltiazem 240 MG 24 hr capsule  Commonly known as:  CARDIZEM CD  Take 1 capsule (240 mg total) by mouth daily.     guaiFENesin 100 MG/5ML liquid  Commonly known as:  ROBITUSSIN  Take 200 mg by mouth 4 (four) times daily as needed for cough.     levofloxacin 750 MG tablet  Commonly known as:  LEVAQUIN  Take 1 tablet (750 mg total) by mouth every other day.     metoprolol 50 MG tablet  Commonly known as:  LOPRESSOR  Take 25 mg by mouth 2 (two) times daily.     NON-ASPIRIN PAIN RELIEVER PO  Take 2 tablets by mouth every 4 (four) hours as needed (for pain).     pravastatin 20 MG tablet  Commonly known as:  PRAVACHOL  Take 20 mg by mouth daily.     predniSONE 20 MG tablet  Commonly known as:  DELTASONE  Take 2 tablets (40 mg total) by mouth daily with breakfast.     tiotropium 18 MCG inhalation capsule  Commonly known as:  SPIRIVA  Place 1 capsule (18 mcg total) into inhaler and inhale daily.     warfarin 2 MG tablet  Commonly known as:  COUMADIN  Take 4-6 mg by mouth every evening. Takes 2 tablets daily, except for Mon, Wed, Fri- takes 6mg .           Follow-up Information   Follow up with Sherryl Manges, MD.   Contact information:   [address] [place] [address] [phone]        The results of significant diagnostics from this hospitalization (including imaging, microbiology, ancillary and laboratory) are listed below  for reference.    Significant Diagnostic Studies: Dg Chest Portable 1 View  02/05/2013  *RADIOLOGY REPORT*  Clinical Data: Shortness of breath, wheezing  PORTABLE CHEST - 1 VIEW  Comparison: 11/03/2010  Findings: Moderate enlargement of the cardiomediastinal silhouette noted with central vascular prominence but no overt edema.  No pleural effusion.  No new pulmonary opacity.  Diffusely prominent interstitial markings are noted, increased since the prior exam.  IMPRESSION: Cardiomegaly mild central vascular congestion and possible early interstitial edema.   Original Report Authenticated By: Christiana Pellant, M.D.  Microbiology: Recent Results (from the past 240 hour(s))  MRSA PCR SCREENING     Status: None   Collection Time    02/06/13  4:43 AM      Result Value Range Status   MRSA by PCR NEGATIVE  NEGATIVE Final   Comment:            The GeneXpert MRSA Assay (FDA     approved for NASAL specimens     only), is one component of a     comprehensive MRSA colonization     surveillance program. It is not     intended to diagnose MRSA     infection nor to guide or     monitor treatment for     MRSA infections.     Labs: Basic Metabolic Panel:  Recent Labs Lab 02/06/13 0857 02/07/13 0450 02/08/13 0525 02/10/13 0520 02/11/13 1021  NA 138 138 138 138 138  K 4.4 4.3 4.7 4.9 4.6  CL 104 104 103 102 104  CO2 23 25 26 29 28   GLUCOSE 219* 154* 184* 137* 115*  BUN 46* 57* 65* 66* 57*  CREATININE 2.84* 2.60* 2.33* 2.16* 1.97*  CALCIUM 8.9 9.0 8.8 8.8 8.6   Liver Function Tests: No results found for this basename: AST, ALT, ALKPHOS, BILITOT, PROT, ALBUMIN,  in the last 168 hours No results found for this basename: LIPASE, AMYLASE,  in the last 168 hours No results found for this basename: AMMONIA,  in the last 168 hours CBC:  Recent Labs Lab 02/05/13 1716 02/06/13 0857 02/07/13 0450 02/08/13 0525 02/10/13 0520  WBC 8.3 7.5 12.9* 10.2 10.4  HGB 14.2 13.1 12.7* 12.3* 11.9*   HCT 42.6 39.9 38.4* 37.2* 36.7*  MCV 90.4 89.9 90.4 88.2 88.9  PLT 144* 124* 138* 150 168   Cardiac Enzymes:  Recent Labs Lab 02/05/13 1716 02/09/13 1220  TROPONINI <0.30 <0.30   BNP: BNP (last 3 results)  Recent Labs  02/05/13 1716  PROBNP 5194.0*   CBG:  Recent Labs Lab 02/07/13 2054  GLUCAP 133*       Signed:  Azrael Huss  [facility] 02/11/2013, 12:13 PM

## 2013-02-11 NOTE — Progress Notes (Signed)
  [name]      SUBJECTIVE: feeling better, less sob hungry  Past Medical History  Diagnosis Date  . Atrial fibrillation     Detected on an event recorder  . Coronary artery disease     Status post marginal stenting ejection fraction 45%  . Transient ischemic attack   . Echocardiogram abnormal     abnormal ejection fraction as per echo 47f 40-50%  . Emphysema   . PVC (premature ventricular contraction)   . Hypertension   . ACE inhibitor intolerance     Cough    PHYSICAL EXAM Filed Vitals:   02/10/13 1300 02/10/13 2017 02/11/13 0430 02/11/13 0810  BP: 141/74 132/78 121/67   Pulse: 101 53 84   Temp: 98.3 F (36.8 C) 98.3 F (36.8 C) 98.4 F (36.9 C)   TempSrc: Oral Oral Oral   Resp: 17 17 18    Height:      Weight:      SpO2: 92% 95% 95% 94%    Well developed and nourished in no acute distress HENT normal Neck supple with JVP-flat Carotids brisk and full without bruits Decreased breath sounds w some wheezing Irregularly irregular rate and rhythm with controlled ventricular response, no murmurs or gallops Abd-soft with active BS without hepatomegaly No Clubbing cyanosis edema Skin-warm and dry A & Oriented  Grossly normal sensory and motor function  TELEMETRY: Reviewed telemetry pt in  afib flutter controlled ventricular response:    Intake/Output Summary (Last 24 hours) at 02/11/13 0948 Last data filed at 02/11/13 0500  Gross per 24 hour  Intake      0 ml  Output   1326 ml  Net  -1326 ml    LABS: Basic Metabolic Panel:  Recent Labs Lab 02/05/13 1716 02/06/13 0857 02/07/13 0450 02/08/13 0525 02/10/13 0520  NA 134* 138 138 138 138  K 4.5 4.4 4.3 4.7 4.9  CL 99 104 104 103 102  CO2 22 23 25 26 29   GLUCOSE 142* 219* 154* 184* 137*  BUN 40* 46* 57* 65* 66*  CREATININE 2.69* 2.84* 2.60* 2.33* 2.16*  CALCIUM 9.1 8.9 9.0 8.8 8.8   Cardiac Enzymes:  Recent Labs  02/09/13 1220  TROPONINI <0.30   CBC:  Recent Labs Lab  02/05/13 1716 02/06/13 0857 02/07/13 0450 02/08/13 0525 02/10/13 0520  WBC 8.3 7.5 12.9* 10.2 10.4  HGB 14.2 13.1 12.7* 12.3* 11.9*  HCT 42.6 39.9 38.4* 37.2* 36.7*  MCV 90.4 89.9 90.4 88.2 88.9  PLT 144* 124* 138* 150 168   PROTIME:  Recent Labs  02/09/13 0515 02/10/13 0520 02/11/13 0535  LABPROT 29.8* 32.1* 29.9*  INR 3.03* 3.35* 3.05*   BNP: BNP (last 3 results)  Recent Labs  02/05/13 1716  PROBNP 5194.0*     ASSESSMENT AND PLAN:  atrial arrhtyhmias new(recurrent) onset in hospital   INR subtherapeutic on arrival ,but therapeutic by the time went into AF/Flutter COPD much better  Will plan dccv today  Signed, Sherryl Manges MD  02/11/2013

## 2013-02-11 NOTE — Progress Notes (Signed)
Assessment unchanged. Pt remains in SR. PO Cardizem given to pt. Discussed D/C instructions with pt and family. Verbalized understanding. RX given to pt. IVs and tele removed. Pt left via W/C accompanied by RN.

## 2013-02-11 NOTE — Progress Notes (Signed)
Amb pt in hallway. At first pt O2 sats decreased to 86%. Applied O2 at 2L and continued walking. Decreased O2 to off and pt maintained above 90% during walk. Total ambulation was 400 ft, 2104ft without O2. When returning to room O2 level was 93% on RA. Case manager updated on [facility] needs. Will continue to monitor pt closely.

## 2013-02-11 NOTE — Procedures (Signed)
Electrical Cardioversion Procedure Note Keith Larson [id number] Apr 01, 1932  Procedure: Electrical Cardioversion Indications:  Atrial Flutter  Procedure Details Consent: Risks of procedure as well as the alternatives and risks of each were explained to the (patient/caregiver).  Consent for procedure obtained. Time Out: Verified patient identification, verified procedure, site/side was marked, verified correct patient position, special equipment/implants available, medications/allergies/relevent history reviewed, required imaging and test results available.  Performed  Patient placed on cardiac monitor, pulse oximetry, supplemental oxygen as necessary.  Sedation given: Diprovan 40 mg and lidocaine 30 mg IV administered by anesthesia. Pacer pads placed anterior and posterior chest.  Cardioverted 1 time(s).  Cardioverted at 120J.  Evaluation Findings: Post procedure EKG shows: NSR; note short runs of atrial flutter following procedure. Complications: None Patient did tolerate procedure well.   Keith Larson 02/11/2013, 10:12 AM

## 2013-02-20 ENCOUNTER — Encounter: Payer: Self-pay | Admitting: Physician Assistant

## 2013-02-20 ENCOUNTER — Ambulatory Visit (INDEPENDENT_AMBULATORY_CARE_PROVIDER_SITE_OTHER): Payer: Medicare Other | Admitting: Physician Assistant

## 2013-02-20 VITALS — BP 128/58 | HR 51 | Ht 68.0 in | Wt 162.8 lb

## 2013-02-20 DIAGNOSIS — N189 Chronic kidney disease, unspecified: Secondary | ICD-10-CM

## 2013-02-20 DIAGNOSIS — R0602 Shortness of breath: Secondary | ICD-10-CM

## 2013-02-20 DIAGNOSIS — I2510 Atherosclerotic heart disease of native coronary artery without angina pectoris: Secondary | ICD-10-CM

## 2013-02-20 DIAGNOSIS — I498 Other specified cardiac arrhythmias: Secondary | ICD-10-CM

## 2013-02-20 DIAGNOSIS — I4891 Unspecified atrial fibrillation: Secondary | ICD-10-CM

## 2013-02-20 DIAGNOSIS — R609 Edema, unspecified: Secondary | ICD-10-CM

## 2013-02-20 DIAGNOSIS — I5032 Chronic diastolic (congestive) heart failure: Secondary | ICD-10-CM

## 2013-02-20 DIAGNOSIS — R001 Bradycardia, unspecified: Secondary | ICD-10-CM

## 2013-02-20 LAB — BASIC METABOLIC PANEL
BUN: 44 mg/dL — ABNORMAL HIGH (ref 6–23)
CO2: 29 mEq/L (ref 19–32)
Chloride: 106 mEq/L (ref 96–112)
Creatinine, Ser: 2.2 mg/dL — ABNORMAL HIGH (ref 0.4–1.5)

## 2013-02-20 MED ORDER — DILTIAZEM HCL ER COATED BEADS 180 MG PO CP24: 180.0000 mg | ORAL_CAPSULE | Freq: Every day | ORAL | Status: DC

## 2013-02-20 MED ORDER — FUROSEMIDE 20 MG PO TABS: ORAL_TABLET | ORAL | Status: DC

## 2013-02-20 NOTE — Patient Instructions (Addendum)
**Note De-Identified Keith Larson Obfuscation** Your physician has recommended you make the following change in your medication: Start taking Furosemide 40 mg (2 tablets) daily X 3 days then decrease to 20 mg (1 tablet) daily and decrease Cardizem to 180 mg daily  Your physician recommends that you return for lab work in: today and on 4/2  Your physician has recommended that you wear an event monitor. Event monitors are medical devices that record the heart's electrical activity. Doctors most often [place] these monitors to diagnose arrhythmias. Arrhythmias are problems with the speed or rhythm of the heartbeat. The monitor is a small, portable device. You can wear one while you do your normal daily activities. This is usually used to diagnose what is causing palpitations/syncope (passing out). You will wear for 21 days  Your physician recommends that you schedule a follow-up appointment in: in 2 weeks and in 6 weeks

## 2013-02-20 NOTE — Progress Notes (Signed)
[address], [address] [place], [place]  [address] Phone: [phone] Fax:  [phone]  Date:  02/20/2013   ID:  Keith Larson, DOB 05/22/1932, MRN [id number]  PCP:  Default, Provider, MD  Primary Cardiologist/Electrophysiologist:  Dr. Sherryl Manges    History of Present Illness: Keith Larson is a 79 y.o. male who returns for f/u after a recent admission to the hospital 3/11-3/17 for a COPD exacerbation complicated by AFib/flutter and volume overload. He has a hx of paroxysmal AFib, on chronic Coumadin therapy, prior TIA, ischemic cardiomyopathy with prior EF 40-45%, CAD, s/p BMS to the CFX in 4/12, HTN, CKD.  LHC 4/12: OM1 90-95% (treated with PCI), mid AV circumflex occluded with right to left collaterals, RCA 20-30%.  Echo 12/11: EF 40-45%, inferior HK.  He presented to the hospital with 3 days of progressively worsening dyspnea and wheezing. He was treated with steroids, bronchodilators and antibiotics by the internal medicine service. He also received IV Lasix due to volume overload. During his hospitalization he developed AFib/Flutter with RVR. Cardiac markers remained normal.  He was placed on rate control with diltiazem and underwent DCCV 02/11/13 with restoration of NSR. Echo 3/14: Mild LVH, EF 55-60%.  Dr. Graciela Husbands did note that the patient had decreasing exercise tolerance at home over the last 6-12 mos and he should be considered for an outpatient event recorder.  It was also recommended he have an outpatient Myoview once his pulmonary status was stable.  Since d/c, he is improving.  PT notes reviewed.  His ambulation is improving.  Does have some mild decrease in O2 with ambulation.  Denies CP.  DOE is noted with moderate activity.  He is NYHA Class IIb-III.  No orthopnea, PND.  No syncope.  No palpitations.  He notes recent onset of LE edema.  Still feels fatigued. Cough improved.   Labs (3/14):  K 4.6, Cr 2.84=>1.97, BNP 5194, Hgb 11.9  Wt Readings from Last 3 Encounters:    02/20/13 162 lb 12.8 oz (73.846 kg)  02/08/13 160 lb 15 oz (73 kg)  08/23/11 173 lb 1.9 oz (78.527 kg)     Past Medical History  Diagnosis Date  . Atrial fibrillation     Detected on an event recorder  . Coronary artery disease     a. LHC 4/12: OM1 90-95%, mid AV circumflex occluded with right to left collaterals, RCA 20-30% => b. PCI: BMS to CFX;  c. Echo 12/11: EF 40-45%, inferior HK.   . Transient ischemic attack   . Ischemic cardiomyopathy     a. EF 40-45% by echo 2012 with inf WMA;  b. Echo 3/14: Mild LVH, EF 55-60%.  . Emphysema   . PVC (premature ventricular contraction)   . Hypertension   . ACE inhibitor intolerance     Cough  . Unspecified diastolic heart failure   . CKD (chronic kidney disease)   . HLD (hyperlipidemia)     Current Outpatient Prescriptions  Medication Sig Dispense Refill  . Acetaminophen (NON-ASPIRIN PAIN RELIEVER PO) Take 2 tablets by mouth every 4 (four) hours as needed (for pain).       [name] albuterol (PROVENTIL HFA;VENTOLIN HFA) 108 (90 BASE) MCG/ACT inhaler Inhale 2 puffs into the lungs 2 (two) times daily.  1 Inhaler  0  . calcium carbonate (TUMS - DOSED IN MG ELEMENTAL CALCIUM) 500 MG chewable tablet Chew 1 tablet (200 mg of elemental calcium total) by mouth 2 (two) times daily.      [name] diltiazem (  CARDIZEM CD) 240 MG 24 hr capsule Take 1 capsule (240 mg total) by mouth daily.  30 capsule  0  . guaiFENesin (ROBITUSSIN) 100 MG/5ML liquid Take 200 mg by mouth 4 (four) times daily as needed for cough.      [name] levofloxacin (LEVAQUIN) 750 MG tablet Take 1 tablet (750 mg total) by mouth every other day.  5 tablet  0  . metoprolol (LOPRESSOR) 50 MG tablet Take 25 mg by mouth 2 (two) times daily.      . pravastatin (PRAVACHOL) 20 MG tablet Take 20 mg by mouth daily.      [name] tiotropium (SPIRIVA) 18 MCG inhalation capsule Place 1 capsule (18 mcg total) into inhaler and inhale daily.  30 capsule  0  . warfarin (COUMADIN) 2 MG tablet Take 4-6 mg by mouth every  evening. Takes 2 tablets daily, except for Mon, Wed, Fri- takes 6mg .       No current facility-administered medications for this visit.    Allergies:   No Known Allergies  Social History:  The patient  reports that he quit smoking about 9 years ago. He has never used smokeless tobacco. He reports that  drinks alcohol. He reports that he does not use illicit drugs.   ROS:  Please see the history of present illness.    All other systems reviewed and negative.   PHYSICAL EXAM: VS:  BP 128/58  Pulse 51  Ht 5\' 8"  (1.727 m)  Wt 162 lb 12.8 oz (73.846 kg)  BMI 24.76 kg/m2 Well nourished, well developed, in no acute distress HEENT: normal Neck: minimally + JVD Cardiac:  distant S1, S2; RRR; no murmur Lungs:  Decreased breath sounds bilaterally, no wheezing, rhonchi or rales Abd: protuberant, soft, nontender, no hepatomegaly Ext: 1-2+ bilateral LE edema Skin: warm and dry Neuro:  CNs 2-12 intact, no focal abnormalities noted  EKG:  Sinus brady, HR 51, rightward axis, no change from prior tracing     ASSESSMENT AND PLAN:  1. Atrial Fibrillation:  No recurrence.  His HR is low and Dr. Graciela Husbands recommended an outpatient monitor to assess for recurrent arrhythmias.  I will arrange a 21 day event monitor.  Coumadin managed by [facility].   2. Chronic Diastolic CHF:  EF improved to normal by recent echo.  He has some evidence of volume overload on exam.  Question if diltiazem playing a role in LE edema as well.  Decrease diltiazem to 180 mg QD (for bradycardia as well).  Add Lasxi 40 mg QD x 3 days, then reduce to 20 mg QD.  Check BMET and BNP today and repeat BMET in 1 week. 3. Coronary Artery Disease:  Patient will follow up soon for edema/CHF.  Plan scheduling myoview at that time.  Continue statin.  No ASA as he is on coumadin. 4. COPD:  Recovering from recent exacerbation.  F/u with PCP. 5. Hyperlipidemia:  Continue statin. 6. Chronic Kidney Disease:  Keep close eye on renal fxn with initiation  of Lasix. 7. Disposition:  Follow up with me in 1-2 weeks and Dr. Sherryl Manges in 4-6 weeks.   [name], PA-C  9:32 AM 02/20/2013

## 2013-02-27 ENCOUNTER — Other Ambulatory Visit (INDEPENDENT_AMBULATORY_CARE_PROVIDER_SITE_OTHER): Payer: Medicare Other

## 2013-02-27 ENCOUNTER — Telehealth: Payer: Self-pay | Admitting: *Deleted

## 2013-02-27 ENCOUNTER — Encounter (INDEPENDENT_AMBULATORY_CARE_PROVIDER_SITE_OTHER): Payer: Medicare Other

## 2013-02-27 DIAGNOSIS — I4891 Unspecified atrial fibrillation: Secondary | ICD-10-CM

## 2013-02-27 LAB — BASIC METABOLIC PANEL
CO2: 29 mEq/L (ref 19–32)
Calcium: 8.3 mg/dL — ABNORMAL LOW (ref 8.4–10.5)
Creatinine, Ser: 2.1 mg/dL — ABNORMAL HIGH (ref 0.4–1.5)
Glucose, Bld: 126 mg/dL — ABNORMAL HIGH (ref 70–99)

## 2013-02-27 NOTE — Telephone Encounter (Signed)
pt notified about lab results with verbal understanding  

## 2013-02-27 NOTE — Telephone Encounter (Signed)
Message copied by Tarri Fuller on Wed Feb 27, 2013  3:52 PM ------      Message from: [place], [place] T      Created: Wed Feb 27, 2013  2:01 PM       K+ ok; renal fxn stable.      Continue with current treatment plan.      [name], PA-C  2:01 PM 02/27/2013 ------

## 2013-02-27 NOTE — Telephone Encounter (Signed)
21 day event monitor placed on Pt 02/27/13 TK

## 2013-03-06 ENCOUNTER — Ambulatory Visit (INDEPENDENT_AMBULATORY_CARE_PROVIDER_SITE_OTHER): Payer: Medicare Other | Admitting: Physician Assistant

## 2013-03-06 ENCOUNTER — Encounter: Payer: Self-pay | Admitting: Physician Assistant

## 2013-03-06 VITALS — BP 116/62 | HR 52 | Ht 68.0 in | Wt 159.8 lb

## 2013-03-06 DIAGNOSIS — I4891 Unspecified atrial fibrillation: Secondary | ICD-10-CM

## 2013-03-06 DIAGNOSIS — I2510 Atherosclerotic heart disease of native coronary artery without angina pectoris: Secondary | ICD-10-CM

## 2013-03-06 DIAGNOSIS — I5032 Chronic diastolic (congestive) heart failure: Secondary | ICD-10-CM

## 2013-03-06 DIAGNOSIS — I25810 Atherosclerosis of coronary artery bypass graft(s) without angina pectoris: Secondary | ICD-10-CM

## 2013-03-06 DIAGNOSIS — N189 Chronic kidney disease, unspecified: Secondary | ICD-10-CM

## 2013-03-06 NOTE — Patient Instructions (Addendum)
Your physician recommends that you continue on your current medications as directed. Please refer to the Current Medication list given to you today.   Your physician recommends that you KEEP YOUR follow-up appointment in WITH DR. Graciela Husbands  Your physician has requested that you have a lexiscan myoview. For further information please visit [email or web address] Please follow instruction sheet, as given.

## 2013-03-06 NOTE — Progress Notes (Signed)
[address] [place], [place]  [address] Phone: [phone] Fax:  [phone]  Date:  03/06/2013   ID:  Keith Larson, DOB 12-28-1931, MRN [id number]  PCP:  Default, Provider, MD  Primary Cardiologist/Electrophysiologist:  Dr. Sherryl Manges    History of Present Illness: Keith Larson is a 79 y.o. male who returns for f/u.  I saw him a couple weeks ago after a recent admission to the hospital for a COPD exacerbation complicated by AFib/flutter and volume overload. He has a hx of paroxysmal AFib, on chronic Coumadin therapy, prior TIA, ischemic cardiomyopathy with prior EF 40-45%, CAD, s/p BMS to the CFX in 4/12, HTN, CKD.  LHC 4/12: OM1 90-95% (treated with PCI), mid AV circumflex occluded with right to left collaterals, RCA 20-30%.  Echo 12/11: EF 40-45%, inferior HK.  During the hospitalization he developed AFib/Flutter with RVR and was placed on rate control with diltiazem. He underwent DCCV 02/11/13 with restoration of NSR. Echo 3/14: Mild LVH, EF 55-60%.  Dr. Graciela Husbands did note that the patient had decreasing exercise tolerance at home over the last 6-12 mos and he should be considered for an outpatient event recorder and an outpatient Myoview once his pulmonary status was stable.  When last seen, he was volume overloaded. I placed him on Lasix. His event monitor was arranged. He was brought back today for follow up. We would consider arranging stress testing at this visit.  He denies CP.  Dyspnea much improved.  Notes NYHA class IIb symptoms.  No orthopnea, PND, edema.  No syncope.    Labs (3/14):  K 4.6=>4.2, Cr 2.84=>1.97=>2.2, BNP 5194=>288, Hgb 11.9 Labs (4/14):  K 3.6, Cr 2.1  Wt Readings from Last 3 Encounters:  03/06/13 159 lb 12.8 oz (72.485 kg)  02/20/13 162 lb 12.8 oz (73.846 kg)  02/08/13 160 lb 15 oz (73 kg)     Past Medical History  Diagnosis Date  . Atrial fibrillation     Detected on an event recorder  . Coronary artery disease     a. LHC 4/12: OM1  90-95%, mid AV circumflex occluded with right to left collaterals, RCA 20-30% => b. PCI: BMS to CFX;  c. Echo 12/11: EF 40-45%, inferior HK.   . Transient ischemic attack   . Ischemic cardiomyopathy     a. EF 40-45% by echo 2012 with inf WMA;  b. Echo 3/14: Mild LVH, EF 55-60%.  . Emphysema   . PVC (premature ventricular contraction)   . Hypertension   . ACE inhibitor intolerance     Cough  . Unspecified diastolic heart failure   . CKD (chronic kidney disease)   . HLD (hyperlipidemia)     Current Outpatient Prescriptions  Medication Sig Dispense Refill  . Acetaminophen (NON-ASPIRIN PAIN RELIEVER PO) Take 2 tablets by mouth every 4 (four) hours as needed (for pain).       [name] albuterol (PROVENTIL HFA;VENTOLIN HFA) 108 (90 BASE) MCG/ACT inhaler Inhale 2 puffs into the lungs 2 (two) times daily.  1 Inhaler  0  . calcium carbonate (TUMS - DOSED IN MG ELEMENTAL CALCIUM) 500 MG chewable tablet Chew 1 tablet (200 mg of elemental calcium total) by mouth 2 (two) times daily.      [name] diltiazem (CARDIZEM CD) 180 MG 24 hr capsule Take 1 capsule (180 mg total) by mouth daily.  30 capsule  1  . furosemide (LASIX) 20 MG tablet Take 20 mg by mouth daily.      [name] guaiFENesin (  ROBITUSSIN) 100 MG/5ML liquid Take 200 mg by mouth 4 (four) times daily as needed for cough.      . metoprolol (LOPRESSOR) 50 MG tablet Take 25 mg by mouth 2 (two) times daily.      [name] tiotropium (SPIRIVA) 18 MCG inhalation capsule Place 1 capsule (18 mcg total) into inhaler and inhale daily.  30 capsule  0  . warfarin (COUMADIN) 2 MG tablet Take 4-6 mg by mouth every evening. Takes 2 tablets daily, except for Mon, Wed, Fri- takes 6mg .      . pravastatin (PRAVACHOL) 20 MG tablet Take 20 mg by mouth daily.       No current facility-administered medications for this visit.    Allergies:   No Known Allergies  Social History:  The patient  reports that he quit smoking about 9 years ago. He has never used smokeless tobacco. He reports that   drinks alcohol. He reports that he does not use illicit drugs.   ROS:  Please see the history of present illness.    All other systems reviewed and negative.   PHYSICAL EXAM: VS:  BP 116/62  Pulse 52  Ht 5\' 8"  (1.727 m)  Wt 159 lb 12.8 oz (72.485 kg)  BMI 24.3 kg/m2 Well nourished, well developed, in no acute distress HEENT: normal Neck: no JVD Cardiac:  distant S1, S2; RRR; no murmur Lungs:  Decreased breath sounds bilaterally, no wheezing, rhonchi or rales Abd: protuberant, soft, nontender, no hepatomegaly Ext: no LE edema Skin: warm and dry Neuro:  CNs 2-12 intact, no focal abnormalities noted  EKG:  Sinus brady, HR 52, rightward axis, no change from prior tracing     ASSESSMENT AND PLAN:  1. Atrial Fibrillation:  Maintaining NSR.  He will complete his monitor and f/u with Dr. Sherryl Manges.  Coumadin managed by [facility].   2. Chronic Diastolic CHF:  Volume improved.  Continue current Rx. 3. Coronary Artery Disease:  Continue statin.  No ASA as he is on coumadin.  Arrange Steffanie Dunn as previously planned.  4. COPD:   F/u with PCP. 5. Hyperlipidemia:  Continue statin. 6. Chronic Kidney Disease:  Creatinine has remained stable with initiation of Lasix. 7. Disposition:  Follow up with Dr. Sherryl Manges in May.   [name], PA-C  11:52 AM 03/06/2013

## 2013-03-20 ENCOUNTER — Ambulatory Visit (HOSPITAL_COMMUNITY): Payer: Medicare Other | Attending: Cardiovascular Disease | Admitting: Radiology

## 2013-03-20 VITALS — BP 124/55 | Ht 68.0 in | Wt 157.0 lb

## 2013-03-20 DIAGNOSIS — E785 Hyperlipidemia, unspecified: Secondary | ICD-10-CM | POA: Insufficient documentation

## 2013-03-20 DIAGNOSIS — I2510 Atherosclerotic heart disease of native coronary artery without angina pectoris: Secondary | ICD-10-CM

## 2013-03-20 DIAGNOSIS — R0602 Shortness of breath: Secondary | ICD-10-CM

## 2013-03-20 DIAGNOSIS — Z87891 Personal history of nicotine dependence: Secondary | ICD-10-CM | POA: Insufficient documentation

## 2013-03-20 DIAGNOSIS — Z8673 Personal history of transient ischemic attack (TIA), and cerebral infarction without residual deficits: Secondary | ICD-10-CM | POA: Insufficient documentation

## 2013-03-20 DIAGNOSIS — I5032 Chronic diastolic (congestive) heart failure: Secondary | ICD-10-CM

## 2013-03-20 DIAGNOSIS — I4891 Unspecified atrial fibrillation: Secondary | ICD-10-CM

## 2013-03-20 DIAGNOSIS — R5381 Other malaise: Secondary | ICD-10-CM | POA: Insufficient documentation

## 2013-03-20 DIAGNOSIS — R0609 Other forms of dyspnea: Secondary | ICD-10-CM | POA: Insufficient documentation

## 2013-03-20 DIAGNOSIS — R0989 Other specified symptoms and signs involving the circulatory and respiratory systems: Secondary | ICD-10-CM | POA: Insufficient documentation

## 2013-03-20 DIAGNOSIS — I25810 Atherosclerosis of coronary artery bypass graft(s) without angina pectoris: Secondary | ICD-10-CM

## 2013-03-20 DIAGNOSIS — I10 Essential (primary) hypertension: Secondary | ICD-10-CM | POA: Insufficient documentation

## 2013-03-20 DIAGNOSIS — N189 Chronic kidney disease, unspecified: Secondary | ICD-10-CM

## 2013-03-20 DIAGNOSIS — J449 Chronic obstructive pulmonary disease, unspecified: Secondary | ICD-10-CM | POA: Insufficient documentation

## 2013-03-20 DIAGNOSIS — J4489 Other specified chronic obstructive pulmonary disease: Secondary | ICD-10-CM | POA: Insufficient documentation

## 2013-03-20 MED ORDER — REGADENOSON 0.4 MG/5ML IV SOLN
0.4000 mg | Freq: Once | INTRAVENOUS | Status: AC
  Administered 2013-03-20: 0.4 mg via INTRAVENOUS

## 2013-03-20 MED ORDER — TECHNETIUM TC 99M SESTAMIBI GENERIC - CARDIOLITE
30.0000 | Freq: Once | INTRAVENOUS | Status: AC | PRN
  Administered 2013-03-20: 30 via INTRAVENOUS

## 2013-03-20 MED ORDER — TECHNETIUM TC 99M SESTAMIBI GENERIC - CARDIOLITE
10.0000 | Freq: Once | INTRAVENOUS | Status: AC | PRN
  Administered 2013-03-20: 10 via INTRAVENOUS

## 2013-03-20 NOTE — Progress Notes (Signed)
[facility] SITE 3 NUCLEAR MED [address] [place], [place] [address] [phone]    Cardiology Nuclear Med Study  Keith Larson is a 79 y.o. male     MRN : [id number]     DOB: 1932/01/29  Procedure Date: 03/20/2013  Nuclear Med Background Indication for Stress Test:  Evaluation for Ischemia and Stent Patency History:  COPD, Emphysema and AFIB, ICM, 10/2010  02/2011 Heart Cath: OMI 90-95% RCA 20-30% mid CFX occlued with collatrerals, 01/2013 ECHO: EF: 55-60% mild LVH Cardiac Risk Factors: History of Smoking, Hypertension, Lipids and TIA  Symptoms:  DOE, Fatigue and SOB   Nuclear Pre-Procedure Caffeine/Decaff Intake:  None > 12 hrs NPO After: 7:00pm   Lungs:  clear O2 Sat: 95% on room air. IV 0.9% NS with Angio Cath:  20g  IV Site: R Wrist x 1, tolerated well IV Started by:  Irean Hong, RN  Chest Size (in):  38 Cup Size: n/a  Height: 5\' 8"  (1.727 m)  Weight:  157 lb (71.215 kg)  BMI:  Body mass index is 23.88 kg/(m^2). Tech Comments:  Took Diltiazem and Lopressor this am. The patient took his Symbicort and Combivent inhaler on arrival    Nuclear Med Study 1 or 2 day study: 1 day  Stress Test Type:  Lexiscan  Reading MD: Kristeen Miss, MD  Order Authorizing Provider:  Sherryl Manges, MD, and [name], [facility]  Resting Radionuclide: Technetium 66m Sestamibi  Resting Radionuclide Dose: 11.0 mCi   Stress Radionuclide:  Technetium 23m Sestamibi  Stress Radionuclide Dose: 33.0 mCi           Stress Protocol Rest HR: 56 Stress HR: 86  Rest BP: 124/55 Stress BP: 135/74  Exercise Time (min): n/a METS: n/a   Predicted Max HR: 141 bpm % Max HR: 60.99 bpm Rate Pressure Product: 21308   Dose of Adenosine (mg):  n/a Dose of Lexiscan: 0.4 mg  Dose of Atropine (mg): n/a Dose of Dobutamine: n/a mcg/kg/min (at max HR)  Stress Test Technologist: Milana Na, EMT-P  Nuclear Technologist:  Domenic Polite, CNMT     Rest Procedure:  Myocardial perfusion imaging was  performed at rest 45 minutes following the intravenous administration of Technetium 3m Sestamibi. Rest ECG: NSR - Normal EKG. PVCs  Stress Procedure:  The patient received IV Lexiscan 0.4 mg over 15-seconds.  Technetium 51m Sestamibi injected at 30-seconds. This patient was sob with the Lexiscan injection. Quantitative spect images were obtained after a 45 minute delay. Stress ECG: No significant change from baseline ECG  QPS Raw Data Images:  Normal; no motion artifact; normal heart/lung ratio. Stress Images:  There is a large severe defect of the mid-basal inferior and lateral walls.   The uptake in the remaining walls is normal   Rest Images:  There is a large severe defect of the mid-basal inferior and lateral walls.   The uptake in the remaining walls is normal Subtraction (SDS):  No evidence of ischemia. Transient Ischemic Dilatation (Normal <1.22):  1.06 Lung/Heart Ratio (Normal <0.45):  0.30  Quantitative Gated Spect Images QGS EDV:  130 ml QGS ESV:  74 ml  Impression Exercise Capacity:  Lexiscan with no exercise. BP Response:  Normal blood pressure response. Clinical Symptoms:  No significant symptoms noted. ECG Impression:  No significant ST segment change suggestive of ischemia. Comparison with Prior Nuclear Study: No significant change from previous study 11/15/10  Overall Impression:  Low risk stress nuclear study.  There is no evidence of ischemia.  There is evidence of a large inferior lateral MI.  This is unchanged from the previous study 11/15/10.  LV Ejection Fraction: 43%.  LV Wall Motion:  The LV is moderately dilated.  The overally LV function is mildly - moderately depressed.   There is severe hypokinesis of the inferior lateral basal segments.    Vesta Mixer, Montez Hageman., MD, [facility] 03/20/2013, 6:19 PM Office - [phone] Pager [phone]

## 2013-03-21 ENCOUNTER — Encounter: Payer: Self-pay | Admitting: Physician Assistant

## 2013-03-26 ENCOUNTER — Telehealth: Payer: Self-pay | Admitting: *Deleted

## 2013-03-26 NOTE — Telephone Encounter (Signed)
pt notified about myoview results with verbal understanding today 

## 2013-04-18 ENCOUNTER — Encounter: Payer: Self-pay | Admitting: Internal Medicine

## 2013-04-18 ENCOUNTER — Ambulatory Visit (INDEPENDENT_AMBULATORY_CARE_PROVIDER_SITE_OTHER): Payer: Medicare Other | Admitting: Internal Medicine

## 2013-04-18 VITALS — BP 138/56 | HR 69 | Ht 68.0 in | Wt 168.8 lb

## 2013-04-18 DIAGNOSIS — I4949 Other premature depolarization: Secondary | ICD-10-CM

## 2013-04-18 DIAGNOSIS — I4891 Unspecified atrial fibrillation: Secondary | ICD-10-CM

## 2013-04-18 DIAGNOSIS — I493 Ventricular premature depolarization: Secondary | ICD-10-CM

## 2013-04-18 DIAGNOSIS — I2510 Atherosclerotic heart disease of native coronary artery without angina pectoris: Secondary | ICD-10-CM

## 2013-04-18 DIAGNOSIS — N189 Chronic kidney disease, unspecified: Secondary | ICD-10-CM

## 2013-04-18 NOTE — Assessment & Plan Note (Signed)
She has to on his 12-lead electrocardiogram. These were a right bundle superior axis morphology. This suggests a potential frequency 10-15%; with recent normal echo no further evaluation at this time

## 2013-04-18 NOTE — Assessment & Plan Note (Signed)
Continue current medications currently taking warfarin but no antiplatelet therapy

## 2013-04-18 NOTE — Patient Instructions (Signed)
Your physician wants you to follow-up in: 1 year with Dr. Klein. You will receive a reminder letter in the mail two months in advance. If you don't receive a letter, please call our office to schedule the follow-up appointment.  Your physician recommends that you continue on your current medications as directed. Please refer to the Current Medication list given to you today.  

## 2013-04-18 NOTE — Assessment & Plan Note (Signed)
Paroxysmal. We'll continue current medications.

## 2013-04-18 NOTE — Progress Notes (Signed)
Patient Care Team: Provider Default, MD as PCP - General   HPI  Keith Larson is a 79 y.o. male Seen in followup for atrial arrhythmias. He is a history of a prior TIA ischemic cardiomyopathy with modest depression of LV systolic function with an EF of 40-50%. He underwent outpatient Myoview scanning demonstrated a perfusion defect consistent with prior Myocardial infarction. LHC 4/12  OM1 90-95% (treated with PCI), mid AV circumflex occluded with right to left collaterals, RCA 20-30%.Echo 3/14: Mild LVH, EF 55-60% The patient was seen last month by [name]. That time he was in sinus rhythm.event recorder was ordered but apparently not done   functional status is stable with chronic dyspnea on exertion  Past Medical History  Diagnosis Date  . Atrial fibrillation     Detected on an event recorder  . Coronary artery disease     a. LHC 4/12: OM1 90-95%, mid AV circumflex occluded with right to left collaterals, RCA 20-30% => b. PCI: BMS to CFX;  c. Echo 12/11: EF 40-45%, inferior HK. ;  d.  [name] Myoview 4/14:  EF 43%, large inferior and lateral scar, no ischemia, unchanged from 11/15/10; low risk  . Transient ischemic attack   . Ischemic cardiomyopathy     a. EF 40-45% by echo 2012 with inf WMA;  b. Echo 3/14: Mild LVH, EF 55-60%.  . Emphysema   . PVC (premature ventricular contraction)   . Hypertension   . ACE inhibitor intolerance     Cough  . Unspecified diastolic heart failure   . CKD (chronic kidney disease)   . HLD (hyperlipidemia)     Past Surgical History  Procedure Laterality Date  . Cholecystectomy      Current Outpatient Prescriptions  Medication Sig Dispense Refill  . Acetaminophen (NON-ASPIRIN PAIN RELIEVER PO) Take 2 tablets by mouth every 4 (four) hours as needed (for pain).       [name] albuterol (PROVENTIL HFA;VENTOLIN HFA) 108 (90 BASE) MCG/ACT inhaler Inhale 2 puffs into the lungs 2 (two) times daily.  1 Inhaler  0  . calcium carbonate (TUMS - DOSED IN MG  ELEMENTAL CALCIUM) 500 MG chewable tablet Chew 1 tablet (200 mg of elemental calcium total) by mouth 2 (two) times daily.      [name] diltiazem (CARDIZEM CD) 180 MG 24 hr capsule Take 1 capsule (180 mg total) by mouth daily.  30 capsule  1  . furosemide (LASIX) 20 MG tablet Take 20 mg by mouth daily.      [name] guaiFENesin (ROBITUSSIN) 100 MG/5ML liquid Take 200 mg by mouth 4 (four) times daily as needed for cough.      . metoprolol (LOPRESSOR) 50 MG tablet Take 25 mg by mouth 2 (two) times daily.      . pravastatin (PRAVACHOL) 20 MG tablet Take 20 mg by mouth daily.      [name] tiotropium (SPIRIVA) 18 MCG inhalation capsule Place 1 capsule (18 mcg total) into inhaler and inhale daily.  30 capsule  0  . warfarin (COUMADIN) 2 MG tablet Take 4-6 mg by mouth every evening. Takes 2 tablets daily, except for Mon, Wed, Fri- takes 6mg .       No current facility-administered medications for this visit.    No Known Allergies  Review of Systems negative except from HPI and PMH  Physical Exam BP 138/56  Pulse 69  Ht 5\' 8"  (1.727 m)  Wt 168 lb 12.8 oz (76.567 kg)  BMI 25.67 kg/m2  SpO2 91% Well developed  and well nourished in no acute distress HENT normal E scleral and icterus clear Neck Supple JVP flat; carotids brisk and full Decreased breath sounds Irregularly irregular rhythm  modestly rapid without murmurs   Soft with active bowel sounds No clubbing cyanosis none Edema Alert and oriented, grossly normal motor and sensory function Skin Warm and Dry  ECG demonstrates sinus rhythm with frequent ventricular ectopics as well as atrial ectopics. The sinus rate is 63 interval 18/09/43 Axis 85  Assessment and  Plan

## 2014-03-05 ENCOUNTER — Encounter (HOSPITAL_COMMUNITY): Payer: Self-pay | Admitting: Emergency Medicine

## 2014-03-05 ENCOUNTER — Inpatient Hospital Stay (HOSPITAL_COMMUNITY)
Admission: EM | Admit: 2014-03-05 | Discharge: 2014-03-09 | DRG: 871 | Disposition: A | Discharge status: Home / Self Care | Payer: Medicare Other | Attending: Internal Medicine | Admitting: Internal Medicine

## 2014-03-05 ENCOUNTER — Emergency Department (HOSPITAL_COMMUNITY): Payer: Medicare Other

## 2014-03-05 DIAGNOSIS — Z7901 Long term (current) use of anticoagulants: Secondary | ICD-10-CM

## 2014-03-05 DIAGNOSIS — J441 Chronic obstructive pulmonary disease with (acute) exacerbation: Secondary | ICD-10-CM

## 2014-03-05 DIAGNOSIS — I2510 Atherosclerotic heart disease of native coronary artery without angina pectoris: Secondary | ICD-10-CM

## 2014-03-05 DIAGNOSIS — Z79899 Other long term (current) drug therapy: Secondary | ICD-10-CM

## 2014-03-05 DIAGNOSIS — N183 Chronic kidney disease, stage 3 unspecified: Secondary | ICD-10-CM

## 2014-03-05 DIAGNOSIS — I4891 Unspecified atrial fibrillation: Secondary | ICD-10-CM

## 2014-03-05 DIAGNOSIS — I509 Heart failure, unspecified: Secondary | ICD-10-CM | POA: Diagnosis present

## 2014-03-05 DIAGNOSIS — J9601 Acute respiratory failure with hypoxia: Secondary | ICD-10-CM

## 2014-03-05 DIAGNOSIS — J9600 Acute respiratory failure, unspecified whether with hypoxia or hypercapnia: Secondary | ICD-10-CM | POA: Diagnosis present

## 2014-03-05 DIAGNOSIS — I2589 Other forms of chronic ischemic heart disease: Secondary | ICD-10-CM | POA: Diagnosis present

## 2014-03-05 DIAGNOSIS — J9690 Respiratory failure, unspecified, unspecified whether with hypoxia or hypercapnia: Secondary | ICD-10-CM

## 2014-03-05 DIAGNOSIS — N19 Unspecified kidney failure: Secondary | ICD-10-CM

## 2014-03-05 DIAGNOSIS — Z87891 Personal history of nicotine dependence: Secondary | ICD-10-CM

## 2014-03-05 DIAGNOSIS — J9691 Respiratory failure, unspecified with hypoxia: Secondary | ICD-10-CM

## 2014-03-05 DIAGNOSIS — I129 Hypertensive chronic kidney disease with stage 1 through stage 4 chronic kidney disease, or unspecified chronic kidney disease: Secondary | ICD-10-CM | POA: Diagnosis present

## 2014-03-05 DIAGNOSIS — J189 Pneumonia, unspecified organism: Secondary | ICD-10-CM

## 2014-03-05 DIAGNOSIS — E875 Hyperkalemia: Secondary | ICD-10-CM | POA: Diagnosis not present

## 2014-03-05 DIAGNOSIS — I428 Other cardiomyopathies: Secondary | ICD-10-CM

## 2014-03-05 DIAGNOSIS — E785 Hyperlipidemia, unspecified: Secondary | ICD-10-CM

## 2014-03-05 DIAGNOSIS — N179 Acute kidney failure, unspecified: Secondary | ICD-10-CM | POA: Diagnosis present

## 2014-03-05 DIAGNOSIS — I5031 Acute diastolic (congestive) heart failure: Secondary | ICD-10-CM

## 2014-03-05 DIAGNOSIS — D72829 Elevated white blood cell count, unspecified: Secondary | ICD-10-CM

## 2014-03-05 DIAGNOSIS — I10 Essential (primary) hypertension: Secondary | ICD-10-CM

## 2014-03-05 DIAGNOSIS — R6520 Severe sepsis without septic shock: Secondary | ICD-10-CM

## 2014-03-05 DIAGNOSIS — Z789 Other specified health status: Secondary | ICD-10-CM

## 2014-03-05 DIAGNOSIS — I5033 Acute on chronic diastolic (congestive) heart failure: Secondary | ICD-10-CM | POA: Diagnosis present

## 2014-03-05 DIAGNOSIS — A419 Sepsis, unspecified organism: Principal | ICD-10-CM | POA: Diagnosis present

## 2014-03-05 DIAGNOSIS — Z9861 Coronary angioplasty status: Secondary | ICD-10-CM

## 2014-03-05 DIAGNOSIS — Z8673 Personal history of transient ischemic attack (TIA), and cerebral infarction without residual deficits: Secondary | ICD-10-CM

## 2014-03-05 LAB — CBC WITH DIFFERENTIAL/PLATELET
BASOS ABS: 0 10*3/uL (ref 0.0–0.1)
Basophils Relative: 0 % (ref 0–1)
Eosinophils Absolute: 0 10*3/uL (ref 0.0–0.7)
Eosinophils Relative: 0 % (ref 0–5)
HCT: 42.1 % (ref 39.0–52.0)
Hemoglobin: 13.9 g/dL (ref 13.0–17.0)
LYMPHS PCT: 7 % — AB (ref 12–46)
Lymphs Abs: 0.9 10*3/uL (ref 0.7–4.0)
MCH: 30.3 pg (ref 26.0–34.0)
MCHC: 33 g/dL (ref 30.0–36.0)
MCV: 91.7 fL (ref 78.0–100.0)
Monocytes Absolute: 0.8 10*3/uL (ref 0.1–1.0)
Monocytes Relative: 7 % (ref 3–12)
NEUTROS ABS: 10 10*3/uL — AB (ref 1.7–7.7)
NEUTROS PCT: 86 % — AB (ref 43–77)
PLATELETS: 173 10*3/uL (ref 150–400)
RBC: 4.59 MIL/uL (ref 4.22–5.81)
RDW: 15.8 % — AB (ref 11.5–15.5)
WBC: 11.6 10*3/uL — AB (ref 4.0–10.5)

## 2014-03-05 LAB — BASIC METABOLIC PANEL
BUN: 25 mg/dL — ABNORMAL HIGH (ref 6–23)
CHLORIDE: 99 meq/L (ref 96–112)
CO2: 25 mEq/L (ref 19–32)
Calcium: 9 mg/dL (ref 8.4–10.5)
Creatinine, Ser: 2.1 mg/dL — ABNORMAL HIGH (ref 0.50–1.35)
GFR calc non Af Amer: 28 mL/min — ABNORMAL LOW (ref >90)
GFR, EST AFRICAN AMERICAN: 33 mL/min — AB (ref >90)
Glucose, Bld: 128 mg/dL — ABNORMAL HIGH (ref 70–99)
Potassium: 4.7 mEq/L (ref 3.7–5.3)
Sodium: 138 mEq/L (ref 137–147)

## 2014-03-05 LAB — I-STAT TROPONIN, ED: TROPONIN I, POC: 0.03 ng/mL (ref 0.00–0.08)

## 2014-03-05 LAB — PRO B NATRIURETIC PEPTIDE: PRO B NATRI PEPTIDE: 3549 pg/mL — AB (ref 0–450)

## 2014-03-05 MED ORDER — FUROSEMIDE 10 MG/ML IJ SOLN
40.0000 mg | Freq: Once | INTRAMUSCULAR | Status: AC
  Administered 2014-03-05: 40 mg via INTRAVENOUS
  Filled 2014-03-05: qty 4

## 2014-03-05 MED ORDER — OXYCODONE-ACETAMINOPHEN 5-325 MG PO TABS
1.0000 | ORAL_TABLET | Freq: Once | ORAL | Status: AC
  Administered 2014-03-05: 1 via ORAL
  Filled 2014-03-05: qty 1

## 2014-03-05 MED ORDER — ALBUTEROL (5 MG/ML) CONTINUOUS INHALATION SOLN
10.0000 mg/h | INHALATION_SOLUTION | Freq: Once | RESPIRATORY_TRACT | Status: AC
  Administered 2014-03-05: 10 mg/h via RESPIRATORY_TRACT
  Filled 2014-03-05: qty 20

## 2014-03-05 MED ORDER — IPRATROPIUM BROMIDE 0.02 % IN SOLN
0.5000 mg | Freq: Once | RESPIRATORY_TRACT | Status: AC
  Administered 2014-03-05: 0.5 mg via RESPIRATORY_TRACT
  Filled 2014-03-05: qty 2.5

## 2014-03-05 MED ORDER — ALBUTEROL SULFATE (2.5 MG/3ML) 0.083% IN NEBU
5.0000 mg | INHALATION_SOLUTION | Freq: Once | RESPIRATORY_TRACT | Status: AC
  Administered 2014-03-05: 5 mg via RESPIRATORY_TRACT
  Filled 2014-03-05: qty 6

## 2014-03-05 MED ORDER — DEXTROSE 5 % IV SOLN
500.0000 mg | INTRAVENOUS | Status: DC
  Administered 2014-03-06: 500 mg via INTRAVENOUS
  Filled 2014-03-05 (×3): qty 500

## 2014-03-05 MED ORDER — CEFTRIAXONE SODIUM 1 G IJ SOLR
1.0000 g | INTRAMUSCULAR | Status: DC
  Administered 2014-03-05: via INTRAVENOUS
  Administered 2014-03-06 – 2014-03-08 (×3): 1 g via INTRAVENOUS
  Filled 2014-03-05 (×6): qty 10

## 2014-03-05 MED ORDER — AZITHROMYCIN 500 MG IV SOLR
500.0000 mg | Freq: Once | INTRAVENOUS | Status: AC
  Administered 2014-03-05: 500 mg via INTRAVENOUS

## 2014-03-05 MED ORDER — WARFARIN - PHARMACIST DOSING INPATIENT
Freq: Every day | Status: DC
  Administered 2014-03-06: 18:00:00

## 2014-03-05 NOTE — H&P (Addendum)
[facility] History and Physical  Davis Ambrosini ZOX:[id number] DOB: 07/29/1932 DOA: 03/05/2014  Referring physician: ER physician PCP: Pcp Not In System   Chief Complaint: shortness of breath   HPI:  80 year old male with past medical history of hypertension, atrial fibrillation on coumadin, dyslipidemia, CKD stage 3, COPD who presented to [facility] ED 03/05/2014 with worsening shortness of breath, cough productive of white/yellow sputum, fevers and chills for past 4 days prior to this admission. Pt reported he went for a motorcycle ride few days ago and shortly thereafter his symptoms started. He had occasional chest tightness with coughing episodes. No hemoptysis. No palpitations. No abdominal pain, no nausea or vomiting. No lightheadedness or loss of consciousness. No blood in stool or urine. Of note, pt was seen in urgent care earlier today and was given rocephin and solumedrol. His symptoms did not improve. In ED, BP was 140/70, HR 101-124, Tmax 1005 F and oxygen saturation 89% on room air. This has quickly improved to 100% with 2 L [place] oxygen support. He was given nebulizer treatments in ED. His 12 lead EKG showed sinus tachycardia. CXR was significant for bilateral pneumonia and possibly CHF. Blood work revealed WBC count of 11.6 and creatinine 2.10 (which is around his baseline values compared to 2014).  Assessment and Plan:  Principal Problem:   Acute respiratory failure with hypoxia, pneumonia, COPD, diastolic CHF - likely due to combination of bilateral pneumonia, COPD exacerbation, CHF exacerbation - he does have evidence of pneumonia on CXR but there is also possible component of CHF as seen on CXR. He does have an acute elevation in BNP, 3549 (in 2014 BNP was 288. His last 2 D ECHO was in 01/2013 and showed EF of 55% - for possible pneumonia we started azithromycin and rocephin. Pneumonia order set in place. Follow up blood culture, resp culture results, legionella, flu a nd strep  pneumonia. Oxygen support via nasal canula and/or non-re breather mask to keep O2 saturation above 90% - for possible COPD we started nebulizer treatments, albuterol and atrovent as needed and scheduled. We started solumedrol 40 mg IV daily and please taper as clinically indicated. COPD gold alert order in place - for possible CHF exacerbation we put fluids on hold; order placed for 1 dose of IV lasix 40 mg. Order placed for 2 D ECHO. Active Problems:   Atrial fibrillation - rate controlled with metoprolol and Cardizem - on anticoagulation with coumadin - 12 lead EKG on admission shoed sinus tachycardia   CKD (chronic kidney disease) stage 3, GFR 30-59 ml/min - In 2014, Creatinine 2.16 adn on this admission 2.10 - monitor renal function as pt given lasix on admission   Leukocytosis - secondary to pneumonia   HTN (hypertension) - continue metoprolol and Cardizem    Dyslipidemia - continue statin therapy    Radiological Exams on Admission: Dg Chest Portable 1 View 03/05/2014    IMPRESSION: 1. Dense bilateral pulmonary interstitial infiltrates suggesting pneumonitis , particularly given the patient's history of cough and fever. 2. Mild cardiomegaly pulmonary venous congestion. Component of congestive heart failure cannot be entirely excluded .     EKG: Sinus tachycardia  Code Status: Full Family Communication: Pt at bedside Disposition Plan: Admit for further evaluation  Alison Murray, MD  [facility] Pager [phone]  Review of Systems:  Constitutional: positive for fever, chills and malaise/fatigue. Negative for diaphoresis.  HENT: Negative for hearing loss, ear pain, nosebleeds, congestion, sore throat, neck pain, tinnitus and ear discharge.  Eyes: Negative for blurred vision, double vision, photophobia, pain, discharge and redness.  Respiratory: per HPI   Cardiovascular: positive for chest pain with coughing, no palpitations, orthopnea, claudication and leg swelling.   Gastrointestinal: Negative for nausea, vomiting and abdominal pain. Negative for heartburn, constipation, blood in stool and melena.  Genitourinary: Negative for dysuria, urgency, frequency, hematuria and flank pain.  Musculoskeletal: Negative for myalgias, back pain, joint pain and falls.  Skin: Negative for itching and rash.  Neurological: Negative for dizziness and weakness. Negative for tingling, tremors, sensory change, speech change, focal weakness, loss of consciousness and headaches.  Endo/Heme/Allergies: Negative for environmental allergies and polydipsia. Does not bruise/bleed easily.  Psychiatric/Behavioral: Negative for suicidal ideas. The patient is not nervous/anxious.      Past Medical History  Diagnosis Date  . Atrial fibrillation     Detected on an event recorder  . Coronary artery disease     a. LHC 4/12: OM1 90-95%, mid AV circumflex occluded with right to left collaterals, RCA 20-30% => b. PCI: BMS to CFX;  c. Echo 12/11: EF 40-45%, inferior HK. ;  d.  [name] Myoview 4/14:  EF 43%, large inferior and lateral scar, no ischemia, unchanged from 11/15/10; low risk  . Transient ischemic attack   . Ischemic cardiomyopathy     a. EF 40-45% by echo 2012 with inf WMA;  b. Echo 3/14: Mild LVH, EF 55-60%.  . Emphysema   . PVC (premature ventricular contraction)   . Hypertension   . ACE inhibitor intolerance     Cough  . Unspecified diastolic heart failure   . CKD (chronic kidney disease)   . HLD (hyperlipidemia)    Past Surgical History  Procedure Laterality Date  . Cholecystectomy     Social History:  reports that he quit smoking about 10 years ago. He has never used smokeless tobacco. He reports that he drinks alcohol. He reports that he does not use illicit drugs.  No Known Allergies   Family History  Problem Relation Age of Onset  . Diabetes Mother   . Heart disease Neg Hx   . Heart attack Neg Hx      Prior to Admission medications   Medication Sig Start  Date End Date Taking? Authorizing Provider  albuterol (PROVENTIL HFA;VENTOLIN HFA) 108 (90 BASE) MCG/ACT inhaler Inhale 2 puffs into the lungs 2 (two) times daily. 02/11/13  Yes [name] Luanne Bras, MD  calcium carbonate (TUMS - DOSED IN MG ELEMENTAL CALCIUM) 500 MG chewable tablet Chew 1 tablet (200 mg of elemental calcium total) by mouth 2 (two) times daily. 02/11/13  Yes [name] Luanne Bras, MD  diltiazem (CARDIZEM CD) 180 MG 24 hr capsule Take 1 capsule (180 mg total) by mouth daily. 02/20/13  Yes Scott Moishe Spice, PA-C  furosemide (LASIX) 20 MG tablet Take 20 mg by mouth daily. 02/20/13  Yes Scott T Alben Spittle, PA-C  guaiFENesin (ROBITUSSIN) 100 MG/5ML liquid Take 200 mg by mouth 4 (four) times daily as needed for cough.   Yes Historical Provider, MD  metoprolol (LOPRESSOR) 50 MG tablet Take 25 mg by mouth 2 (two) times daily.   Yes Historical Provider, MD  pravastatin (PRAVACHOL) 20 MG tablet Take 20 mg by mouth daily.   Yes Historical Provider, MD  tiotropium (SPIRIVA) 18 MCG inhalation capsule Place 1 capsule (18 mcg total) into inhaler and inhale daily. 02/11/13  Yes [name] Luanne Bras, MD  warfarin (COUMADIN) 2 MG tablet Take 4-6 mg by mouth every evening. Takes 2 tablets daily,  except for Mon, Wed, Fri- takes 6mg .   Yes Historical Provider, MD   Physical Exam: Filed Vitals:   03/05/14 2115 03/05/14 2133 03/05/14 2157 03/05/14 2213  BP: 167/79 162/61    Pulse: 101 124    Temp:  99.1 F (37.3 C) 100.5 F (38.1 C)   TempSrc:  Oral    Resp: 28 23    Height:      Weight:      SpO2: 89% 91%  93%    Physical Exam  Constitutional: Appears ill, no acute distress  HENT: Normocephalic. No tonsillar erythema or exudates Eyes: Conjunctivae and EOM are normal. PERRLA, no scleral icterus.  Neck: Normal ROM. Neck supple. No JVD. No tracheal deviation. No thyromegaly.  CVS: tachycardic, S1/S2 appreciated  Pulmonary: basilar crackles, coarse breath sounds with wheezing in upper lung lobes.  Abdominal: Soft. BS +,  no  distension, tenderness, rebound or guarding.  Musculoskeletal: Normal range of motion. No edema and no tenderness.  Lymphadenopathy: No lymphadenopathy noted, cervical, inguinal. Neuro: Alert. No focal neurologic deficits  Skin: Skin is warm and dry.  Psychiatric: Normal mood and affect.   Labs on Admission:  Basic Metabolic Panel:  Recent Labs Lab 03/05/14 2014  NA 138  K 4.7  CL 99  CO2 25  GLUCOSE 128*  BUN 25*  CREATININE 2.10*  CALCIUM 9.0   Liver Function Tests: No results found for this basename: AST, ALT, ALKPHOS, BILITOT, PROT, ALBUMIN,  in the last 168 hours No results found for this basename: LIPASE, AMYLASE,  in the last 168 hours No results found for this basename: AMMONIA,  in the last 168 hours CBC:  Recent Labs Lab 03/05/14 2014  WBC 11.6*  NEUTROABS 10.0*  HGB 13.9  HCT 42.1  MCV 91.7  PLT 173   Cardiac Enzymes: No results found for this basename: CKTOTAL, CKMB, CKMBINDEX, TROPONINI,  in the last 168 hours BNP: No components found with this basename: POCBNP,  CBG: No results found for this basename: GLUCAP,  in the last 168 hours  If 7PM-7AM, please contact night-coverage [email or web address] Password [email or web address] 03/05/2014, 10:15 PM

## 2014-03-05 NOTE — ED Notes (Signed)
Per ems- pt rode motorcycles on Friday and Saturday he started having a productive cough and increasing sob pt also with fever. Pt taking [facility] brand "non-asprin". bp 170/80 p-120 pt a&o. Skin warm and dry. Pt from PCP and received 1g rocephin IM, Kenalog 80mg  IM, dueoneb. Pt on 2L [place].

## 2014-03-05 NOTE — Progress Notes (Signed)
ANTICOAGULATION CONSULT NOTE - Initial Consult  Pharmacy Consult for Coumadin Indication: atrial fibrillation  No Known Allergies  Patient Measurements: Height: 5\' 8"  (172.7 cm) Weight: 159 lb (72.122 kg) IBW/kg (Calculated) : 68.4  Vital Signs: Temp: 100.5 F (38.1 C) (04/08 2157) Temp src: Oral (04/08 2133) BP: 162/61 mmHg (04/08 2133) Pulse Rate: 124 (04/08 2133)  Labs:  Recent Labs  03/05/14 2014  HGB 13.9  HCT 42.1  PLT 173  CREATININE 2.10*    Estimated Creatinine Clearance: 27.1 ml/min (by C-G formula based on Cr of 2.1).   Medical History: Past Medical History  Diagnosis Date  . Atrial fibrillation     Detected on an event recorder  . Coronary artery disease     a. LHC 4/12: OM1 90-95%, mid AV circumflex occluded with right to left collaterals, RCA 20-30% => b. PCI: BMS to CFX;  c. Echo 12/11: EF 40-45%, inferior HK. ;  d.  [name] Myoview 4/14:  EF 43%, large inferior and lateral scar, no ischemia, unchanged from 11/15/10; low risk  . Transient ischemic attack   . Ischemic cardiomyopathy     a. EF 40-45% by echo 2012 with inf WMA;  b. Echo 3/14: Mild LVH, EF 55-60%.  . Emphysema   . PVC (premature ventricular contraction)   . Hypertension   . ACE inhibitor intolerance     Cough  . Unspecified diastolic heart failure   . CKD (chronic kidney disease)   . HLD (hyperlipidemia)     Medications:  See electronic med rec  Assessment: 80 y.o. male presents with SOB, PNA, COPD exacerbation. Pt on coumadin PTA for afib. Home dose 4mg  daily except for 6mg  on Mon/Wed/Fri. Pt took dose already for today. INR ordered for tomorrow a.m. CBC stable at baseline. No bleeding noted.  Goal of Therapy:  INR 2-3 Monitor platelets by anticoagulation protocol: Yes   Plan:  1) Will f/u INR in a.m. and daily **Pt already took dose today**  Christoper Fabian, PharmD, BCPS Clinical pharmacist, pager [phone] 03/05/2014,10:18 PM

## 2014-03-05 NOTE — ED Provider Notes (Signed)
CSN: [id number]     Arrival date & time 03/05/14  1958 History   First MD Initiated Contact with Patient 03/05/14 2012     Chief Complaint  Patient presents with  . Shortness of Breath      Patient is a 80 y.o. male presenting with shortness of breath. The history is provided by the patient.  Shortness of Breath Severity:  Moderate Onset quality:  Gradual Duration:  4 days Timing:  Intermittent Progression:  Worsening Chronicity:  Recurrent Relieved by:  Nothing Worsened by:  Activity Associated symptoms: cough, fever and sputum production   Associated symptoms: no hemoptysis   Associated symptoms comment:  Chest pain with cough  pt reports that he rode motorcycles last week, and then starting last weekend he noted cough, chills, and SOB.  He was seen at an urgent care today, was found to have pneumonia (though no CXR available at this time on my evaluation) and was given rocephin and also steroid injection and sent for evaluation. He has also been given nebulizer treatments as well.   Past Medical History  Diagnosis Date  . Atrial fibrillation     Detected on an event recorder  . Coronary artery disease     a. LHC 4/12: OM1 90-95%, mid AV circumflex occluded with right to left collaterals, RCA 20-30% => b. PCI: BMS to CFX;  c. Echo 12/11: EF 40-45%, inferior HK. ;  d.  [name] Myoview 4/14:  EF 43%, large inferior and lateral scar, no ischemia, unchanged from 11/15/10; low risk  . Transient ischemic attack   . Ischemic cardiomyopathy     a. EF 40-45% by echo 2012 with inf WMA;  b. Echo 3/14: Mild LVH, EF 55-60%.  . Emphysema   . PVC (premature ventricular contraction)   . Hypertension   . ACE inhibitor intolerance     Cough  . Unspecified diastolic heart failure   . CKD (chronic kidney disease)   . HLD (hyperlipidemia)    Past Surgical History  Procedure Laterality Date  . Cholecystectomy     Family History  Problem Relation Age of Onset  . Diabetes Mother   .  Heart disease Neg Hx   . Heart attack Neg Hx    History  Substance Use Topics  . Smoking status: Former Smoker -- 1.00 packs/day for 50 years    Quit date: 02/12/2004  . Smokeless tobacco: Never Used  . Alcohol Use: Yes     Comment: several alcoholic beverages a week    Review of Systems  Constitutional: Positive for fever and chills.  Respiratory: Positive for cough, sputum production and shortness of breath. Negative for hemoptysis.   Cardiovascular:       CP with cough   Gastrointestinal: Positive for nausea.  Neurological: Positive for weakness.  All other systems reviewed and are negative.     Allergies  Review of patient's allergies indicates no known allergies.  Home Medications   Current Outpatient Rx  Name  Route  Sig  Dispense  Refill  . albuterol (PROVENTIL HFA;VENTOLIN HFA) 108 (90 BASE) MCG/ACT inhaler   Inhalation   Inhale 2 puffs into the lungs 2 (two) times daily.   1 Inhaler   0   . calcium carbonate (TUMS - DOSED IN MG ELEMENTAL CALCIUM) 500 MG chewable tablet   Oral   Chew 1 tablet (200 mg of elemental calcium total) by mouth 2 (two) times daily.         [name] diltiazem (CARDIZEM CD)  180 MG 24 hr capsule   Oral   Take 1 capsule (180 mg total) by mouth daily.   30 capsule   1   . furosemide (LASIX) 20 MG tablet   Oral   Take 20 mg by mouth daily.         [name]. guaiFENesin (ROBITUSSIN) 100 MG/5ML liquid   Oral   Take 200 mg by mouth 4 (four) times daily as needed for cough.         . metoprolol (LOPRESSOR) 50 MG tablet   Oral   Take 25 mg by mouth 2 (two) times daily.         . pravastatin (PRAVACHOL) 20 MG tablet   Oral   Take 20 mg by mouth daily.         [name]. tiotropium (SPIRIVA) 18 MCG inhalation capsule   Inhalation   Place 1 capsule (18 mcg total) into inhaler and inhale daily.   30 capsule   0   . warfarin (COUMADIN) 2 MG tablet   Oral   Take 4-6 mg by mouth every evening. Takes 2 tablets daily, except for Mon, Wed, Fri-  takes 6mg .          BP 143/64  Pulse 121  Temp(Src) 98.9 F (37.2 C) (Oral)  Resp 26  Ht 5\' 8"  (1.727 m)  Wt 159 lb (72.122 kg)  BMI 24.18 kg/m2  SpO2 100% BP 162/61  Pulse 124  Temp(Src) 100.5 F (38.1 C) (Oral)  Resp 23  Ht 5\' 8"  (1.727 m)  Wt 159 lb (72.122 kg)  BMI 24.18 kg/m2  SpO2 91%  Physical Exam CONSTITUTIONAL: Well developed/well nourished HEAD: Normocephalic/atraumatic EYES: EOMI/PERRL ENMT: Mucous membranes moist NECK: supple no meningeal signs SPINE:entire spine nontender CV: S1/S2 noted, no murmurs/rubs/gallops noted LUNGS: mild tachypnea noted, crackles noted in bilateral bases, pt is able to speak to me ABDOMEN: soft, nontender, no rebound or guarding GU:no cva tenderness NEURO: Pt is awake/alert, moves all extremitiesx4 EXTREMITIES: pulses normal, full ROM, no LE edema noted SKIN: warm, color normal PSYCH: no abnormalities of mood noted  ED Course  Procedures  Pt here with pneumonia, also noted to have wheezing and SOB.  He is tachycardic.  He has required multiple nebulizer treatments and is high risk for worsening.  I have checked on patient frequently.   Will admit patient.    He was given ROCEPHIN prior to arrival and I have ordered azithromycin  10:09 PM D/w dr Elisabeth Pigeondevine, [facility] will admit  CRITICAL CARE Performed by: Joya Gaskinsonald W Jaidyn Usery Total critical care time: 4935 Critical care time was exclusive of separately billable procedures and treating other patients. Critical care was necessary to treat or prevent imminent or life-threatening deterioration. Critical care was time spent personally by me on the following activities: development of treatment plan with patient and/or surrogate as well as nursing, discussions with consultants, evaluation of patient's response to treatment, examination of patient, obtaining history from patient or surrogate, ordering and performing treatments and interventions, ordering and review of laboratory studies,  ordering and review of radiographic studies, pulse oximetry and re-evaluation of patient's condition.  Labs Review Labs Reviewed  CBC WITH DIFFERENTIAL - Abnormal; Notable for the following:    WBC 11.6 (*)    RDW 15.8 (*)    Neutrophils Relative % 86 (*)    Neutro Abs 10.0 (*)    Lymphocytes Relative 7 (*)    All other components within normal limits  BASIC METABOLIC PANEL  PRO B NATRIURETIC  PEPTIDE  Rosezena Sensor, ED   Imaging Review Dg Chest Portable 1 View  03/05/2014   CLINICAL DATA:  Productive cough.  Fever.  EXAM: PORTABLE CHEST - 1 VIEW  COMPARISON:  DG CHEST 1V PORT dated 02/05/2013  FINDINGS: Cardiomegaly and mild pulmonary vascular prominence noted. Component congestive heart failure cannot be excluded. Dense bilateral pulmonary interstitial infiltrates. Given patient's history of cough and fever bilateral severe pneumonitis is suspected. No pleural effusion or pneumothorax.  IMPRESSION: 1. Dense bilateral pulmonary interstitial infiltrates suggesting pneumonitis , particularly given the patient's history of cough and fever. 2. Mild cardiomegaly pulmonary venous congestion. Component of congestive heart failure cannot be entirely excluded .   Electronically Signed   By: Maisie Fus  Register   On: 03/05/2014 21:29     EKG Interpretation   Date/Time:  Wednesday March 05 2014 20:04:17 EDT Ventricular Rate:  124 PR Interval:  158 QRS Duration: 87 QT Interval:  319 QTC Calculation: 458 R Axis:   82 Text Interpretation:  Sinus tachycardia Borderline right axis deviation  Borderline repolarization abnormality Confirmed by Bebe Shaggy  MD, Dorinda Hill  [phone]) on 03/05/2014 8:36:10 PM      MDM   Final diagnoses:  CAP (community acquired pneumonia)  Renal failure    Nursing notes including past medical history and social history reviewed and considered in documentation xrays reviewed and considered Labs/vital reviewed and considered     Joya Gaskins, MD 03/05/14 2209

## 2014-03-06 DIAGNOSIS — J441 Chronic obstructive pulmonary disease with (acute) exacerbation: Secondary | ICD-10-CM

## 2014-03-06 LAB — URINE MICROSCOPIC-ADD ON

## 2014-03-06 LAB — CBC WITH DIFFERENTIAL/PLATELET
Basophils Absolute: 0 10*3/uL (ref 0.0–0.1)
Basophils Relative: 0 % (ref 0–1)
EOS ABS: 0 10*3/uL (ref 0.0–0.7)
EOS PCT: 0 % (ref 0–5)
HEMATOCRIT: 39.8 % (ref 39.0–52.0)
Hemoglobin: 12.8 g/dL — ABNORMAL LOW (ref 13.0–17.0)
LYMPHS ABS: 0.6 10*3/uL — AB (ref 0.7–4.0)
LYMPHS PCT: 5 % — AB (ref 12–46)
MCH: 29.7 pg (ref 26.0–34.0)
MCHC: 32.2 g/dL (ref 30.0–36.0)
MCV: 92.3 fL (ref 78.0–100.0)
MONO ABS: 1.1 10*3/uL — AB (ref 0.1–1.0)
Monocytes Relative: 8 % (ref 3–12)
Neutro Abs: 10.9 10*3/uL — ABNORMAL HIGH (ref 1.7–7.7)
Neutrophils Relative %: 87 % — ABNORMAL HIGH (ref 43–77)
Platelets: 181 10*3/uL (ref 150–400)
RBC: 4.31 MIL/uL (ref 4.22–5.81)
RDW: 15.7 % — ABNORMAL HIGH (ref 11.5–15.5)
WBC: 12.6 10*3/uL — ABNORMAL HIGH (ref 4.0–10.5)

## 2014-03-06 LAB — LEGIONELLA ANTIGEN, URINE: LEGIONELLA ANTIGEN, URINE: NEGATIVE

## 2014-03-06 LAB — URINALYSIS, ROUTINE W REFLEX MICROSCOPIC
BILIRUBIN URINE: NEGATIVE
GLUCOSE, UA: NEGATIVE mg/dL
HGB URINE DIPSTICK: NEGATIVE
KETONES UR: NEGATIVE mg/dL
Leukocytes, UA: NEGATIVE
Nitrite: NEGATIVE
Protein, ur: 30 mg/dL — AB
SPECIFIC GRAVITY, URINE: 1.012 (ref 1.005–1.030)
Urobilinogen, UA: 0.2 mg/dL (ref 0.0–1.0)
pH: 5.5 (ref 5.0–8.0)

## 2014-03-06 LAB — COMPREHENSIVE METABOLIC PANEL
ALT: 11 U/L (ref 0–53)
AST: 15 U/L (ref 0–37)
Albumin: 3 g/dL — ABNORMAL LOW (ref 3.5–5.2)
Alkaline Phosphatase: 58 U/L (ref 39–117)
BUN: 27 mg/dL — ABNORMAL HIGH (ref 6–23)
CO2: 24 mEq/L (ref 19–32)
Calcium: 9 mg/dL (ref 8.4–10.5)
Chloride: 99 mEq/L (ref 96–112)
Creatinine, Ser: 2.29 mg/dL — ABNORMAL HIGH (ref 0.50–1.35)
GFR, EST AFRICAN AMERICAN: 29 mL/min — AB (ref >90)
GFR, EST NON AFRICAN AMERICAN: 25 mL/min — AB (ref >90)
GLUCOSE: 180 mg/dL — AB (ref 70–99)
POTASSIUM: 5 meq/L (ref 3.7–5.3)
Sodium: 139 mEq/L (ref 137–147)
TOTAL PROTEIN: 6.8 g/dL (ref 6.0–8.3)
Total Bilirubin: 0.2 mg/dL — ABNORMAL LOW (ref 0.3–1.2)

## 2014-03-06 LAB — PHOSPHORUS: Phosphorus: 4 mg/dL (ref 2.3–4.6)

## 2014-03-06 LAB — PROTIME-INR
INR: 2.1 — ABNORMAL HIGH (ref 0.00–1.49)
Prothrombin Time: 22.9 seconds — ABNORMAL HIGH (ref 11.6–15.2)

## 2014-03-06 LAB — INFLUENZA PANEL BY PCR (TYPE A & B)
H1N1 flu by pcr: NOT DETECTED
Influenza A By PCR: NEGATIVE
Influenza B By PCR: NEGATIVE

## 2014-03-06 LAB — MRSA PCR SCREENING: MRSA by PCR: NEGATIVE

## 2014-03-06 LAB — MAGNESIUM: Magnesium: 2 mg/dL (ref 1.5–2.5)

## 2014-03-06 LAB — TSH: TSH: 1.53 u[IU]/mL (ref 0.350–4.500)

## 2014-03-06 LAB — EXPECTORATED SPUTUM ASSESSMENT W REFEX TO RESP CULTURE

## 2014-03-06 LAB — EXPECTORATED SPUTUM ASSESSMENT W GRAM STAIN, RFLX TO RESP C

## 2014-03-06 LAB — GLUCOSE, CAPILLARY: GLUCOSE-CAPILLARY: 101 mg/dL — AB (ref 70–99)

## 2014-03-06 LAB — APTT: APTT: 48 s — AB (ref 24–37)

## 2014-03-06 LAB — STREP PNEUMONIAE URINARY ANTIGEN: STREP PNEUMO URINARY ANTIGEN: NEGATIVE

## 2014-03-06 MED ORDER — CALCIUM CARBONATE ANTACID 500 MG PO CHEW
1.0000 | CHEWABLE_TABLET | Freq: Two times a day (BID) | ORAL | Status: DC
  Administered 2014-03-06 – 2014-03-09 (×7): 200 mg via ORAL
  Filled 2014-03-06 (×10): qty 1

## 2014-03-06 MED ORDER — METHYLPREDNISOLONE SODIUM SUCC 40 MG IJ SOLR
40.0000 mg | Freq: Every day | INTRAMUSCULAR | Status: DC
  Administered 2014-03-06 – 2014-03-07 (×2): 40 mg via INTRAVENOUS
  Filled 2014-03-06 (×2): qty 1

## 2014-03-06 MED ORDER — IPRATROPIUM BROMIDE 0.02 % IN SOLN: 0.5000 mg | RESPIRATORY_TRACT | Status: DC | PRN

## 2014-03-06 MED ORDER — ACETAMINOPHEN 650 MG RE SUPP: 650.0000 mg | Freq: Four times a day (QID) | RECTAL | Status: DC | PRN

## 2014-03-06 MED ORDER — ALBUTEROL SULFATE (2.5 MG/3ML) 0.083% IN NEBU
2.5000 mg | INHALATION_SOLUTION | Freq: Four times a day (QID) | RESPIRATORY_TRACT | Status: DC
Start: 2014-03-06 — End: 2014-03-06

## 2014-03-06 MED ORDER — ALBUTEROL SULFATE (2.5 MG/3ML) 0.083% IN NEBU: 5.0000 mg | INHALATION_SOLUTION | RESPIRATORY_TRACT | Status: DC | PRN

## 2014-03-06 MED ORDER — SODIUM CHLORIDE 0.9 % IV SOLN
INTRAVENOUS | Status: DC
  Administered 2014-03-06: 999 mL via INTRAVENOUS
  Administered 2014-03-07: 20 mL/h via INTRAVENOUS

## 2014-03-06 MED ORDER — SIMVASTATIN 10 MG PO TABS
10.0000 mg | ORAL_TABLET | Freq: Every day | ORAL | Status: DC
  Administered 2014-03-06 – 2014-03-08 (×3): 10 mg via ORAL
  Filled 2014-03-06 (×4): qty 1

## 2014-03-06 MED ORDER — DILTIAZEM HCL ER COATED BEADS 180 MG PO CP24
180.0000 mg | ORAL_CAPSULE | Freq: Every day | ORAL | Status: DC
  Administered 2014-03-06 – 2014-03-09 (×4): 180 mg via ORAL
  Filled 2014-03-06 (×4): qty 1

## 2014-03-06 MED ORDER — METOPROLOL TARTRATE 25 MG PO TABS
25.0000 mg | ORAL_TABLET | Freq: Two times a day (BID) | ORAL | Status: DC
  Administered 2014-03-06 – 2014-03-09 (×8): 25 mg via ORAL
  Filled 2014-03-06 (×9): qty 1

## 2014-03-06 MED ORDER — HYDROCODONE-ACETAMINOPHEN 5-325 MG PO TABS: 1.0000 | ORAL_TABLET | ORAL | Status: DC | PRN

## 2014-03-06 MED ORDER — WARFARIN SODIUM 4 MG PO TABS
4.0000 mg | ORAL_TABLET | Freq: Once | ORAL | Status: AC
  Administered 2014-03-06: 4 mg via ORAL
  Filled 2014-03-06: qty 1

## 2014-03-06 MED ORDER — ACETAMINOPHEN 325 MG PO TABS: 650.0000 mg | ORAL_TABLET | Freq: Four times a day (QID) | ORAL | Status: DC | PRN

## 2014-03-06 MED ORDER — BUDESONIDE 0.25 MG/2ML IN SUSP
0.2500 mg | Freq: Two times a day (BID) | RESPIRATORY_TRACT | Status: DC
  Administered 2014-03-06 – 2014-03-09 (×7): 0.25 mg via RESPIRATORY_TRACT
  Filled 2014-03-06 (×10): qty 2

## 2014-03-06 MED ORDER — BENZONATATE 100 MG PO CAPS
100.0000 mg | ORAL_CAPSULE | Freq: Two times a day (BID) | ORAL | Status: DC
  Administered 2014-03-06 – 2014-03-09 (×8): 100 mg via ORAL
  Filled 2014-03-06 (×9): qty 1

## 2014-03-06 MED ORDER — SODIUM CHLORIDE 0.9 % IJ SOLN
3.0000 mL | Freq: Two times a day (BID) | INTRAMUSCULAR | Status: DC
  Administered 2014-03-06 – 2014-03-08 (×4): 3 mL via INTRAVENOUS

## 2014-03-06 MED ORDER — GUAIFENESIN 100 MG/5ML PO SOLN
200.0000 mg | Freq: Four times a day (QID) | ORAL | Status: DC | PRN
  Filled 2014-03-06: qty 10

## 2014-03-06 MED ORDER — ONDANSETRON HCL 4 MG/2ML IJ SOLN: 4.0000 mg | Freq: Four times a day (QID) | INTRAMUSCULAR | Status: DC | PRN

## 2014-03-06 MED ORDER — PNEUMOCOCCAL VAC POLYVALENT 25 MCG/0.5ML IJ INJ
0.5000 mL | INJECTION | INTRAMUSCULAR | Status: AC
  Administered 2014-03-07: 0.5 mL via INTRAMUSCULAR
  Filled 2014-03-06: qty 0.5

## 2014-03-06 MED ORDER — IPRATROPIUM-ALBUTEROL 0.5-2.5 (3) MG/3ML IN SOLN
3.0000 mL | Freq: Four times a day (QID) | RESPIRATORY_TRACT | Status: DC
  Administered 2014-03-06 – 2014-03-08 (×8): 3 mL via RESPIRATORY_TRACT
  Filled 2014-03-06 (×8): qty 3

## 2014-03-06 MED ORDER — ONDANSETRON HCL 4 MG PO TABS: 4.0000 mg | ORAL_TABLET | Freq: Four times a day (QID) | ORAL | Status: DC | PRN

## 2014-03-06 MED ORDER — MORPHINE SULFATE 2 MG/ML IJ SOLN: 1.0000 mg | INTRAMUSCULAR | Status: DC | PRN

## 2014-03-06 MED ORDER — IPRATROPIUM BROMIDE 0.02 % IN SOLN: 0.5000 mg | Freq: Four times a day (QID) | RESPIRATORY_TRACT | Status: DC

## 2014-03-06 NOTE — Evaluation (Signed)
Physical Therapy Evaluation Patient Details Name: Keith Larson MRN: [id number] DOB: May 17, 1932 Today's Date: 03/06/2014   History of Present Illness  adm 4/8 with worsening shortness of breath, cough productive of white/yellow sputum, fevers and chills  Clinical Impression  Pt admitted with dyspnea with COPD exacerbation vs CHF exacerbation vs pna. Pt currently with functional limitations due to the deficits listed below (see PT Problem List). Pt slightly unsteady and feels his legs are "not working like they usually do." Will also continue to assess SaO2 levels with activity as pt may need home O2 on d/c. Pt will benefit from skilled PT to increase their independence and safety with mobility to allow discharge to the venue listed below.       Follow Up Recommendations No PT follow up;Supervision - Intermittent    Equipment Recommendations  None recommended by PT  (may need home O2)   Recommendations for Other Services       Precautions / Restrictions Precautions Precautions: Fall      Mobility  Bed Mobility Overal bed mobility: Modified Independent             General bed mobility comments: HOB elevated  Transfers Overall transfer level: Needs assistance Equipment used: None Transfers: Sit to/from Stand Sit to Stand: Supervision         General transfer comment: due to lines, potential for LOB  Ambulation/Gait Ambulation/Gait assistance: Supervision Ambulation Distance (Feet): 100 Feet Assistive device: None Gait Pattern/deviations: Step-through pattern;Decreased stride length     General Gait Details: Pt appears steady. States his legs feel a bit "discoordinated" and heavy.  Stairs            Wheelchair Mobility    Modified Rankin (Stroke Patients Only)       Balance Overall balance assessment: Needs assistance                               Standardized Balance Assessment Standardized Balance Assessment : Berg Balance Test Berg  Balance Test Sit to Stand: Able to stand  independently using hands Standing Unsupported: Able to stand safely 2 minutes Sitting with Back Unsupported but Feet Supported on Floor or Stool: Able to sit safely and securely 2 minutes Stand to Sit: Sits safely with minimal use of hands Standing Unsupported with Eyes Closed: Able to stand 10 seconds with supervision Standing Ubsupported with Feet Together: Able to place feet together independently and stand 1 minute safely From Standing, Reach Forward with Outstretched Arm: Can reach confidently >25 cm (10") From Standing Position, Pick up Object from Floor: Able to pick up shoe safely and easily From Standing Position, Turn to Look Behind Over each Shoulder: Looks behind from both sides and weight shifts well Standing Unsupported, Alternately Place Feet on Step/Stool: Needs assistance to keep from falling or unable to try (on 8th step, lost balance to Lt req'd assist to recover) Standing Unsupported, One Foot in Front: Able to plae foot ahead of the other independently and hold 30 seconds         Pertinent Vitals/Pain SaO2 on 3L 92%; with activity decr to 85% and incr to 4L with SaO2 88%, incr to 6L and varied 92-96%; at end of session returned to 3L and dropped to 88% requiring 4 minutes to incr to 90%; RN aware     Home Living Family/patient expects to be discharged to:: Private residence Living Arrangements: Children Available Help at Discharge: Family;Available 24 hours/day Type  of Home: House Home Access: Stairs to enter Entrance Stairs-Rails: Left Entrance Stairs-Number of Steps: 4 Home Layout: One level Home Equipment: None      Prior Function Level of Independence: Independent         Comments: son does housework and yard     Higher education careers adviserHand Dominance        Extremity/Trunk Assessment   Upper Extremity Assessment: Overall WFL for tasks assessed           Lower Extremity Assessment: Overall WFL for tasks assessed       Cervical / Trunk Assessment: Normal  Communication   Communication: No difficulties  Cognition Arousal/Alertness: Awake/alert Behavior During Therapy: WFL for tasks assessed/performed Overall Cognitive Status: Within Functional Limits for tasks assessed                      General Comments      Exercises        Assessment/Plan    PT Assessment Patient needs continued PT services  PT Diagnosis Difficulty walking   PT Problem List Decreased activity tolerance;Decreased balance;Cardiopulmonary status limiting activity  PT Treatment Interventions Gait training;Functional mobility training;Therapeutic activities;Balance training;Patient/family education   PT Goals (Current goals can be found in the Care Plan section) Acute Rehab PT Goals Patient Stated Goal: return home without O2 required PT Goal Formulation: With patient Time For Goal Achievement: 03/13/14 Potential to Achieve Goals: Good    Frequency Min 3X/week   Barriers to discharge        Co-evaluation               End of Session Equipment Utilized During Treatment: Gait belt;Oxygen Activity Tolerance: Treatment limited secondary to medical complications (Comment) (decr SaO2 with activity) Patient left: in bed;with call bell/phone within reach;with nursing/sitter in room Nurse Communication: Mobility status;Other (comment) (IV site leaking blood)         Time: [id number] PT Time Calculation (min): 27 min   Charges:   PT Evaluation $Initial PT Evaluation Tier I: 1 Procedure PT Treatments $Gait Training: 8-22 mins   PT G CodesScherrie November:          Trevonn Hallum P Horris Speros 03/06/2014, 3:35 PM Pager [phone]

## 2014-03-06 NOTE — Progress Notes (Signed)
Clinical Social Work Department BRIEF PSYCHOSOCIAL ASSESSMENT 03/06/2014  Patient:  Keith Larson     Account Number:  [id number]     Admit date:  03/05/2014  Clinical Social Worker:  Varney Biles  Date/Time:  03/06/2014 12:00 N  Referred by:  Physician  Date Referred:  03/06/2014 Referred for  Other - See comment   Other Referral:   COPD Gold Protocol   Interview type:  Patient Other interview type:    PSYCHOSOCIAL DATA Living Status:  FAMILY Admitted from facility:   Level of care:   Primary support name:  Keith Larson ([phone]) Primary support relationship to patient:  CHILD, ADULT Degree of support available:   Good--pt states he has support from friends, and lives with his son.    CURRENT CONCERNS Current Concerns  Other - See comment   Other Concerns:   COPD Gold Protocol    SOCIAL WORK ASSESSMENT / PLAN Engaged pt in COPD Gold Protocol. Pt answered all questions on Depression Scale (PHQ-9). Pt states when he finds himself short of breath, he has a spray-medication that helps him regain his breath. This relieves his chest pain/shortness of breath within a matter of minutes. Pt states he lives with his son, and has multiple friends that check on him/provide support. Pt expressed that he does feel "weighed down" by his COPD sometimes, and that he is often winded walking from his home to the car, but he has ample support and his medication helps a great deal. Pt states he does not have any change in mood and has not experienced depression/anxiety.   Assessment/plan status:  No Further Intervention Required Other assessment/ plan:   Information/referral to community resources:   COPD Gold Protocol counseling    PATIENT'S/FAMILY'S RESPONSE TO PLAN OF CARE: Good--pt engaged in conversation with CSW. Friends visiting when CSW went to complete assessment, stepped out for CSW assessment. Pt understanding of CSW role and engaged openly in conversation. No risk  factors for depression/anxiety identified. CSW signing off.       Keith Larson, MSW, [facility] Clinical Social Worker [phone]

## 2014-03-06 NOTE — Progress Notes (Signed)
Utilization Review Completed.Keith Humphrey T Dowell4/07/2014  

## 2014-03-06 NOTE — Progress Notes (Signed)
[facility] Progress Note  TEAM 1 - Stepdown/ICU TEAM   Keith Larson OMV:[id number] DOB: 03-May-1932 DOA: 03/05/2014 PCP: Pcp Not In System  Brief narrative: Keith Larson is a 80 y.o. male presenting on 03/05/2014 with past medical history of hypertension, atrial fibrillation on coumadin, dyslipidemia, CKD stage 3, COPD who presented to [facility] ED 03/05/2014 with worsening shortness of breath, cough productive of white/yellow sputum, fevers and chills for past 4 days prior to this admission. He had a temp of 100.5 and O2 sat of 89% on RA. He is found to have b/l PNA on CXR.    Subjective: Coughing but breathing better.   Assessment/Plan: Principal Problem:   Acute respiratory failure with hypoxia/ Sepsis - CAP,  COPD exacerbation - cont Rocephin and Zithro - cont Steroids and nebs- still wheezing  Active Problems: Possible CHF exacerbation?? - h/o diastolic CHF -  given 1 x Lasix- will hold off on further for today as Cr up and he appears euvolemic today    Atrial fibrillation  - cont Metoprolol and Cardizem- rate controlled in 80s now - cont Coumadin    Coronary artery disease status post bare-metal stenting of the acute marginal - cont B Blocker- not on ASA??    AKI on CKD (chronic kidney disease) stage 3 - hold diuretics (takes Lasix at home as well)     HTN (hypertension) - cont Dilt and Metoprolol   Code Status: Full code Family Communication: with brother Disposition Plan: PT eval  Consultants: none  Procedures: none  Antibiotics: Anti-infectives   Start     Dose/Rate Route Frequency Ordered Stop   03/05/14 2215  cefTRIAXone (ROCEPHIN) 1 g in dextrose 5 % 50 mL IVPB     1 g 100 mL/hr over 30 Minutes Intravenous Every 24 hours 03/05/14 2214 03/12/14 2159   03/05/14 2215  azithromycin (ZITHROMAX) 500 mg in dextrose 5 % 250 mL IVPB     500 mg 250 mL/hr over 60 Minutes Intravenous Every 24 hours 03/05/14 2214 03/12/14 2159   03/05/14 2145  azithromycin  (ZITHROMAX) 500 mg in dextrose 5 % 250 mL IVPB     500 mg 250 mL/hr over 60 Minutes Intravenous  Once 03/05/14 2144 03/05/14 2343      DVT prophylaxis: coumadin  Objective: Filed Weights   03/06/14 0234 03/06/14 0316 03/06/14 0424  Weight: 72.4 kg (159 lb 9.8 oz) 72.4 kg (159 lb 9.8 oz) 72.4 kg (159 lb 9.8 oz)   Blood pressure 137/68, pulse 80, temperature 98.1 F (36.7 C), temperature source Oral, resp. rate 17, height 5\' 8"  (1.727 m), weight 72.4 kg (159 lb 9.8 oz), SpO2 96.00%.  Intake/Output Summary (Last 24 hours) at 03/06/14 0853 Last data filed at 03/06/14 0834  Gross per 24 hour  Intake  16.17 ml  Output    850 ml  Net -833.83 ml     Exam: General: AAO x 3, No acute respiratory distress Lungs: diffuse wheezing- 3 L O2- 95% Cardiovascular: Irregular rate and rhythm without murmur gallop or rub normal S1 and S2 Abdomen: Nontender, nondistended, soft, bowel sounds positive, no rebound, no ascites, no appreciable mass Extremities: No significant cyanosis, clubbing, or edema bilateral lower extremities  Data Reviewed: Basic Metabolic Panel:  Recent Labs Lab 03/05/14 2014 03/06/14 0318  NA 138 139  K 4.7 5.0  CL 99 99  CO2 25 24  GLUCOSE 128* 180*  BUN 25* 27*  CREATININE 2.10* 2.29*  CALCIUM 9.0 9.0  MG  --  2.0  PHOS  --  4.0   Liver Function Tests:  Recent Labs Lab 03/06/14 0318  AST 15  ALT 11  ALKPHOS 58  BILITOT 0.2*  PROT 6.8  ALBUMIN 3.0*   No results found for this basename: LIPASE, AMYLASE,  in the last 168 hours No results found for this basename: AMMONIA,  in the last 168 hours CBC:  Recent Labs Lab 03/05/14 2014 03/06/14 0318  WBC 11.6* 12.6*  NEUTROABS 10.0* 10.9*  HGB 13.9 12.8*  HCT 42.1 39.8  MCV 91.7 92.3  PLT 173 181   Cardiac Enzymes: No results found for this basename: CKTOTAL, CKMB, CKMBINDEX, TROPONINI,  in the last 168 hours BNP (last 3 results)  Recent Labs  03/05/14 2014  PROBNP 3549.0*   CBG:  Recent  Labs Lab 03/06/14 0831  GLUCAP 101*    Recent Results (from the past 240 hour(s))  MRSA PCR SCREENING     Status: None   Collection Time    03/06/14  2:40 AM      Result Value Ref Range Status   MRSA by PCR NEGATIVE  NEGATIVE Final   Comment:            The GeneXpert MRSA Assay (FDA     approved for NASAL specimens     only), is one component of a     comprehensive MRSA colonization     surveillance program. It is not     intended to diagnose MRSA     infection nor to guide or     monitor treatment for     MRSA infections.  CULTURE, EXPECTORATED SPUTUM-ASSESSMENT     Status: None   Collection Time    03/06/14  3:23 AM      Result Value Ref Range Status   Specimen Description SPUTUM   Final   Special Requests NONE   Final   Sputum evaluation     Final   Value: THIS SPECIMEN IS ACCEPTABLE. RESPIRATORY CULTURE REPORT TO FOLLOW.   Report Status 03/06/2014 FINAL   Final     Studies:  Recent x-ray studies have been reviewed in detail by the Attending Physician  Scheduled Meds:  Scheduled Meds: . azithromycin  500 mg Intravenous Q24H  . benzonatate  100 mg Oral BID  . budesonide (PULMICORT) nebulizer solution  0.25 mg Nebulization BID  . calcium carbonate  1 tablet Oral BID WC  . cefTRIAXone (ROCEPHIN)  IV  1 g Intravenous Q24H  . diltiazem  180 mg Oral Daily  . ipratropium-albuterol  3 mL Nebulization Q6H  . methylPREDNISolone (SOLU-MEDROL) injection  40 mg Intravenous Daily  . metoprolol  25 mg Oral BID  . [START ON 03/07/2014] pneumococcal 23 valent vaccine  0.5 mL Intramuscular Tomorrow-1000  . simvastatin  10 mg Oral q1800  . sodium chloride  3 mL Intravenous Q12H  . Warfarin - Pharmacist Dosing Inpatient   Does not apply q1800   Continuous Infusions: . sodium chloride 999 mL (03/06/14 0323)    Time spent on care of this patient: > 35 min   Keith CantorSaima Lauren Modisette, MD 03/06/2014, 8:53 AM  LOS: 1 day   [facility] Office  [phone] Pager - Text Page per  Loretha StaplerAmion   If 7PM-7AM, please contact night-coverage [email or web address]

## 2014-03-06 NOTE — Progress Notes (Signed)
ANTICOAGULATION CONSULT NOTE - Follow Up  Pharmacy Consult for Coumadin Indication: atrial fibrillation  No Known Allergies  Patient Measurements: Height: 5\' 8"  (172.7 cm) Weight: 159 lb 9.8 oz (72.4 kg) IBW/kg (Calculated) : 68.4  Vital Signs: Temp: 98.3 F (36.8 C) (04/09 1242) Temp src: Oral (04/09 1242) BP: 122/59 mmHg (04/09 1242) Pulse Rate: 77 (04/09 1242)  Labs:  Recent Labs  03/05/14 2014 03/06/14 0318  HGB 13.9 12.8*  HCT 42.1 39.8  PLT 173 181  APTT  --  48*  LABPROT  --  22.9*  INR  --  2.10*  CREATININE 2.10* 2.29*    Estimated Creatinine Clearance: 24.9 ml/min (by C-G formula based on Cr of 2.29).   Medical History: Past Medical History  Diagnosis Date  . Atrial fibrillation     Detected on an event recorder  . Coronary artery disease     a. LHC 4/12: OM1 90-95%, mid AV circumflex occluded with right to left collaterals, RCA 20-30% => b. PCI: BMS to CFX;  c. Echo 12/11: EF 40-45%, inferior HK. ;  d.  [name] Myoview 4/14:  EF 43%, large inferior and lateral scar, no ischemia, unchanged from 11/15/10; low risk  . Transient ischemic attack   . Ischemic cardiomyopathy     a. EF 40-45% by echo 2012 with inf WMA;  b. Echo 3/14: Mild LVH, EF 55-60%.  . Emphysema   . PVC (premature ventricular contraction)   . Hypertension   . ACE inhibitor intolerance     Cough  . Unspecified diastolic heart failure   . CKD (chronic kidney disease)   . HLD (hyperlipidemia)     Medications:  See electronic med rec  Assessment: 80 y.o. male admitted 03/05/2014  with SOB, PNA, COPD exacerbation. Pharmacy consulted to continue on coumadin PTA for afib.   PMH: HTN, Afib, hyperlipidemia, CKD3, COPD  Coag: Afib, Home dose 4mg  daily except for 6mg  on Mon/Wed/Fri.  INR at goal, No bleeding noted.  ID: AECOPD, PNA 4/8 Ceftriaxone/Azithro  CV: CAD, AFib, HTN, hlp  Renal: CrCl ~25 ml/min Metop, simva, dilt (not on ASA PTA?)  Goal of Therapy:  INR 2-3 Monitor  platelets by anticoagulation protocol: Yes   Plan:  1) Will give 4 mg today, follow closely on concurrent antibiotics 2) Daily INR   Thank you for allowing pharmacy to be a part of this patients care team.  Lovenia Kim Pharm.D., BCPS Clinical Pharmacist 03/06/2014 1:40 PM Pager: [phone] Phone: [phone]

## 2014-03-06 NOTE — Progress Notes (Addendum)
Nutrition Consult/Brief Note  Patient consulted for assessment of nutrition status/COPD Gold Protocol.   Body mass index is 24.27 kg/(m^2). Patient meets criteria for Normal based on current BMI.   Wt Readings from Last 10 Encounters:  03/06/14 159 lb 9.8 oz (72.4 kg)  04/18/13 168 lb 12.8 oz (76.567 kg)  03/20/13 157 lb (71.215 kg)  03/06/13 159 lb 12.8 oz (72.485 kg)  02/20/13 162 lb 12.8 oz (73.846 kg)  02/08/13 160 lb 15 oz (73 kg)  08/23/11 173 lb 1.9 oz (78.527 kg)  04/05/11 173 lb 1.9 oz (78.527 kg)  02/22/11 168 lb (76.204 kg)  11/15/10 174 lb (78.926 kg)    Per weight readings above, no recent significant weight loss.  Current diet order is Regular.  Reports a good appetite. Labs and medications reviewed.   No nutrition interventions warranted at this time. If nutrition issues arise, please consult RD.   Maureen Chatters, RD, LDN Pager #: [phone] After-Hours Pager #: [phone]

## 2014-03-06 NOTE — Progress Notes (Signed)
Pt has a bed on 5W per report,  rn tried to call report x2 between 2010-2045,  and was told receiving rn was busy with a new admission and will call and get report, phone number was provided for her to call back. Just as I was about to call and see if charge nurse could take report, the receiving rn called around 2125 and said that she was ready to receive report. Report was subsequently given and pt has been transferred to 5w. -----Kyasia Steuck, rn

## 2014-03-07 DIAGNOSIS — N19 Unspecified kidney failure: Secondary | ICD-10-CM

## 2014-03-07 LAB — URINE CULTURE
COLONY COUNT: NO GROWTH
CULTURE: NO GROWTH

## 2014-03-07 LAB — BASIC METABOLIC PANEL
BUN: 40 mg/dL — AB (ref 6–23)
CALCIUM: 9 mg/dL (ref 8.4–10.5)
CO2: 27 mEq/L (ref 19–32)
Chloride: 99 mEq/L (ref 96–112)
Creatinine, Ser: 2.43 mg/dL — ABNORMAL HIGH (ref 0.50–1.35)
GFR calc Af Amer: 27 mL/min — ABNORMAL LOW (ref >90)
GFR, EST NON AFRICAN AMERICAN: 24 mL/min — AB (ref >90)
Glucose, Bld: 136 mg/dL — ABNORMAL HIGH (ref 70–99)
POTASSIUM: 5.2 meq/L (ref 3.7–5.3)
SODIUM: 139 meq/L (ref 137–147)

## 2014-03-07 LAB — CBC
HCT: 40.2 % (ref 39.0–52.0)
Hemoglobin: 12.9 g/dL — ABNORMAL LOW (ref 13.0–17.0)
MCH: 29.3 pg (ref 26.0–34.0)
MCHC: 32.1 g/dL (ref 30.0–36.0)
MCV: 91.2 fL (ref 78.0–100.0)
Platelets: 197 10*3/uL (ref 150–400)
RBC: 4.41 MIL/uL (ref 4.22–5.81)
RDW: 15.5 % (ref 11.5–15.5)
WBC: 9 10*3/uL (ref 4.0–10.5)

## 2014-03-07 LAB — PROTIME-INR
INR: 1.47 (ref 0.00–1.49)
PROTHROMBIN TIME: 17.4 s — AB (ref 11.6–15.2)

## 2014-03-07 LAB — GLUCOSE, CAPILLARY: GLUCOSE-CAPILLARY: 117 mg/dL — AB (ref 70–99)

## 2014-03-07 MED ORDER — AZITHROMYCIN 500 MG PO TABS
500.0000 mg | ORAL_TABLET | Freq: Every day | ORAL | Status: DC
  Administered 2014-03-07 – 2014-03-08 (×2): 500 mg via ORAL
  Filled 2014-03-07 (×3): qty 1

## 2014-03-07 MED ORDER — WARFARIN SODIUM 6 MG PO TABS
6.0000 mg | ORAL_TABLET | Freq: Once | ORAL | Status: AC
  Administered 2014-03-07: 6 mg via ORAL
  Filled 2014-03-07: qty 1

## 2014-03-07 MED ORDER — PREDNISONE 50 MG PO TABS
60.0000 mg | ORAL_TABLET | Freq: Every day | ORAL | Status: DC
  Administered 2014-03-08: 60 mg via ORAL
  Filled 2014-03-07 (×2): qty 1

## 2014-03-07 MED ORDER — SODIUM CHLORIDE 0.9 % IV SOLN
INTRAVENOUS | Status: DC
  Administered 2014-03-07: 1000 mL via INTRAVENOUS
  Administered 2014-03-08: 06:00:00 via INTRAVENOUS

## 2014-03-07 NOTE — Evaluation (Signed)
Occupational Therapy Evaluation and Discharge Patient Details Name: Keith Larson MRN: [id number] DOB: [birth year] Today's Date: 03/07/2014    History of Present Illness adm 4/8 with worsening shortness of breath, cough productive of white/yellow sputum, fevers and chills   Clinical Impression   This 80 yo male admitted with above presents to acute OT with above with PLOF at Independent. Pt currently at a S level due to drop in O2 when on RA (monitoring). Feel he will be able to manage own O2/tubing at home. No further OT needs identified, we will sign off.    Follow Up Recommendations  No OT follow up    Equipment Recommendations  None recommended by OT       Precautions / Restrictions Precautions Precautions: Fall Precaution Comments: drop in O2 sats on RA with activity Restrictions Weight Bearing Restrictions: No      Mobility Bed Mobility Overal bed mobility: Independent             General bed mobility comments: HOB elevated  Transfers Overall transfer level: Needs assistance Equipment used: None Transfers: Sit to/from Stand Sit to Stand: Supervision         General transfer comment: due to lines and monitor O2         ADL                                         General ADL Comments: Mod I due to DOE/increased WOB. Advised pt to use a seat for shower (says he can get one) and keep O2 on while showering, use hand held shower head, do not use hot/steaming water--this will make breathing worse. Educated pt on purse lipped breathing. Gave pt energy conservation handout with certain sections highlighted that would be of benefit to him/               Pertinent Vitals/Pain See separate progress note for O2 readings     Hand Dominance Right   Extremity/Trunk Assessment Upper Extremity Assessment Upper Extremity Assessment: Overall WFL for tasks assessed           Communication Communication Communication: No difficulties    Cognition Arousal/Alertness: Awake/alert Behavior During Therapy: WFL for tasks assessed/performed Overall Cognitive Status: Within Functional Limits for tasks assessed                                Home Living Family/patient expects to be discharged to:: Private residence Living Arrangements: Children Available Help at Discharge: Family;Available 24 hours/day Type of Home: House Home Access: Stairs to enter [employer] of Steps: 4 Entrance Stairs-Rails: Left Home Layout: One level     Bathroom Shower/Tub: Tub/shower unit;Curtain Shower/tub characteristics: Engineer, building services: Standard     Home Equipment: None          Prior Functioning/Environment Level of Independence: Independent        Comments: son does housework and yard             Leisure centre manager goals can be found in the care plan section) Acute Rehab OT Goals Patient Stated Goal: Home today with this O2 tank (he had a handout out printed off of the web)  OT Frequency:                End of Session Equipment Utilized During Treatment:  (pushing IV  pole, O2 after checked on RA with ambulation) Nurse Communication:  (I am putting in qualifications for home O2)  Activity Tolerance: Patient tolerated treatment well Patient left: in bed;with call bell/phone within reach;with family/visitor present (added 2 lines of extension so pt could get up to bathroom without removing O2)   Time: 1513-1540 OT Time Calculation (min): 27 min Charges:  OT General Charges $OT Visit: 1 Procedure OT Evaluation $Initial OT Evaluation Tier I: 1 Procedure OT Treatments $Self Care/Home Management : 8-22 mins  Evette GeorgesCatherine Eva Doron Shake [phone] 03/07/2014, 3:58 PM

## 2014-03-07 NOTE — Progress Notes (Signed)
[facility] Progress Note [place] TEAM 1 - Stepdown/ICU TEAM   Keith PellegriniFrank Nabor WGN:[id number] DOB: [birth year] DOA: 03/05/2014 PCP: Pcp Not In System  Brief narrative: Keith Larson is a 80 y.o. male presenting on 03/05/2014 with past medical history of hypertension, atrial fibrillation on coumadin, dyslipidemia, CKD stage 3, COPD who presented to [facility] ED 03/05/2014 with worsening shortness of breath, cough productive of white/yellow sputum, fevers and chills for past 4 days prior to this admission. He had a temp of 100.5 and O2 sat of 89% on RA. He is found to have b/l PNA on CXR.    Subjective: No new complaints- cough persists.   Assessment/Plan: Principal Problem:   Acute respiratory failure with hypoxia/ Sepsis - CAP,  COPD exacerbation - cont Rocephin and Zithro - cont nebs- taper Steroids  Active Problems: Possible CHF exacerbation?? - h/o diastolic CHF -  given 1 x Lasix- will hold off on further for today as Cr up and he appears euvolemic today    Atrial fibrillation  - cont Metoprolol and Cardizem- rate controlled in 80s now - cont Coumadin    Coronary artery disease status post bare-metal stenting of the acute marginal - cont B Blocker- not on ASA??    AKI on CKD (chronic kidney disease) stage 3 - cont to hold diuretics - will start slow IVF as numbers are worsening      HTN (hypertension) - cont Dilt and Metoprolol   Code Status: Full code Family Communication: with brother Disposition Plan: PT eval  Consultants: none  Procedures: none  Antibiotics: Anti-infectives   Start     Dose/Rate Route Frequency Ordered Stop   03/07/14 2100  azithromycin (ZITHROMAX) tablet 500 mg     500 mg Oral Daily 03/07/14 1003     03/05/14 2215  cefTRIAXone (ROCEPHIN) 1 g in dextrose 5 % 50 mL IVPB     1 g 100 mL/hr over 30 Minutes Intravenous Every 24 hours 03/05/14 2214 03/12/14 2159   03/05/14 2215  azithromycin (ZITHROMAX) 500 mg in dextrose 5 % 250 mL IVPB  Status:   Discontinued     500 mg 250 mL/hr over 60 Minutes Intravenous Every 24 hours 03/05/14 2214 03/07/14 1003   03/05/14 2145  azithromycin (ZITHROMAX) 500 mg in dextrose 5 % 250 mL IVPB     500 mg 250 mL/hr over 60 Minutes Intravenous  Once 03/05/14 2144 03/05/14 2343      DVT prophylaxis: coumadin  Objective: Filed Weights   03/06/14 0424 03/06/14 2159 03/07/14 0431  Weight: 72.4 kg (159 lb 9.8 oz) 70.8 kg (156 lb 1.4 oz) 70.8 kg (156 lb 1.4 oz)   Blood pressure 125/64, pulse 112, temperature 98.5 F (36.9 C), temperature source Oral, resp. rate 16, height 5\' 8"  (1.727 m), weight 70.8 kg (156 lb 1.4 oz), SpO2 94.00%.  Intake/Output Summary (Last 24 hours) at 03/07/14 1436 Last data filed at 03/07/14 1220  Gross per 24 hour  Intake 758.67 ml  Output   1200 ml  Net -441.33 ml     Exam: General: AAO x 3, No acute respiratory distress Lungs: coarse crackles in b/l lower lobes 3 L O2- 95% Cardiovascular: Irregular rate and rhythm without murmur gallop or rub normal S1 and S2 Abdomen: Nontender, nondistended, soft, bowel sounds positive, no rebound, no ascites, no appreciable mass Extremities: No significant cyanosis, clubbing, or edema bilateral lower extremities  Data Reviewed: Basic Metabolic Panel:  Recent Labs Lab 03/05/14 2014 03/06/14 0318 03/07/14 0707  NA 138  139 139  K 4.7 5.0 5.2  CL 99 99 99  CO2 25 24 27   GLUCOSE 128* 180* 136*  BUN 25* 27* 40*  CREATININE 2.10* 2.29* 2.43*  CALCIUM 9.0 9.0 9.0  MG  --  2.0  --   PHOS  --  4.0  --    Liver Function Tests:  Recent Labs Lab 03/06/14 0318  AST 15  ALT 11  ALKPHOS 58  BILITOT 0.2*  PROT 6.8  ALBUMIN 3.0*   No results found for this basename: LIPASE, AMYLASE,  in the last 168 hours No results found for this basename: AMMONIA,  in the last 168 hours CBC:  Recent Labs Lab 03/05/14 2014 03/06/14 0318 03/07/14 0707  WBC 11.6* 12.6* 9.0  NEUTROABS 10.0* 10.9*  --   HGB 13.9 12.8* 12.9*  HCT  42.1 39.8 40.2  MCV 91.7 92.3 91.2  PLT 173 181 197   Cardiac Enzymes: No results found for this basename: CKTOTAL, CKMB, CKMBINDEX, TROPONINI,  in the last 168 hours BNP (last 3 results)  Recent Labs  03/05/14 2014  PROBNP 3549.0*   CBG:  Recent Labs Lab 03/06/14 0831 03/07/14 0742  GLUCAP 101* 117*    Recent Results (from the past 240 hour(s))  MRSA PCR SCREENING     Status: None   Collection Time    03/06/14  2:40 AM      Result Value Ref Range Status   MRSA by PCR NEGATIVE  NEGATIVE Final   Comment:            The GeneXpert MRSA Assay (FDA     approved for NASAL specimens     only), is one component of a     comprehensive MRSA colonization     surveillance program. It is not     intended to diagnose MRSA     infection nor to guide or     monitor treatment for     MRSA infections.  URINE CULTURE     Status: None   Collection Time    03/06/14  2:42 AM      Result Value Ref Range Status   Specimen Description URINE, CLEAN CATCH   Final   Special Requests NONE   Final   Culture  Setup Time     Final   Value: 03/06/2014 08:59     Performed at [facility] Count     Final   Value: NO GROWTH     Performed at [facility]   Culture     Final   Value: NO GROWTH     Performed at [facility]   Report Status 03/07/2014 FINAL   Final  CULTURE, EXPECTORATED SPUTUM-ASSESSMENT     Status: None   Collection Time    03/06/14  3:23 AM      Result Value Ref Range Status   Specimen Description SPUTUM   Final   Special Requests NONE   Final   Sputum evaluation     Final   Value: THIS SPECIMEN IS ACCEPTABLE. RESPIRATORY CULTURE REPORT TO FOLLOW.   Report Status 03/06/2014 FINAL   Final  CULTURE, RESPIRATORY (NON-EXPECTORATED)     Status: None   Collection Time    03/06/14  3:23 AM      Result Value Ref Range Status   Specimen Description SPUTUM   Final   Special Requests NONE   Final   Gram Stain     Final  Value: ABUNDANT WBC  PRESENT, PREDOMINANTLY PMN     FEW SQUAMOUS EPITHELIAL CELLS PRESENT     FEW GRAM POSITIVE COCCI     IN PAIRS IN CLUSTERS FEW GRAM VARIABLE ROD     Performed at [facility]   Culture     Final   Value: NORMAL OROPHARYNGEAL FLORA     Performed at [facility]   Report Status PENDING   Incomplete     Studies:  Recent x-ray studies have been reviewed in detail by the Attending Physician  Scheduled Meds:  Scheduled Meds: . azithromycin  500 mg Oral Daily  . benzonatate  100 mg Oral BID  . budesonide (PULMICORT) nebulizer solution  0.25 mg Nebulization BID  . calcium carbonate  1 tablet Oral BID WC  . cefTRIAXone (ROCEPHIN)  IV  1 g Intravenous Q24H  . diltiazem  180 mg Oral Daily  . ipratropium-albuterol  3 mL Nebulization Q6H  . methylPREDNISolone (SOLU-MEDROL) injection  40 mg Intravenous Daily  . metoprolol  25 mg Oral BID  . simvastatin  10 mg Oral q1800  . sodium chloride  3 mL Intravenous Q12H  . warfarin  6 mg Oral ONCE-1800  . Warfarin - Pharmacist Dosing Inpatient   Does not apply q1800   Continuous Infusions:    Time spent on care of this patient: > 35 min   Calvert Cantor, MD 03/07/2014, 2:36 PM  LOS: 2 days   [facility] Office  [phone] Pager - Text Page per Loretha Stapler   If 7PM-7AM, please contact night-coverage [email or web address]

## 2014-03-07 NOTE — Progress Notes (Signed)
ANTICOAGULATION CONSULT NOTE - Follow Up  Pharmacy Consult for Coumadin Indication: atrial fibrillation  No Known Allergies  Patient Measurements: Height: 5\' 8"  (172.7 cm) Weight: 156 lb 1.4 oz (70.8 kg) IBW/kg (Calculated) : 68.4  Vital Signs: Temp: 98.3 F (36.8 C) (04/10 0431) Temp src: Oral (04/10 0431) BP: 140/67 mmHg (04/10 0431) Pulse Rate: 65 (04/10 0431)  Labs:  Recent Labs  03/05/14 2014 03/06/14 0318 03/07/14 0707  HGB 13.9 12.8* 12.9*  HCT 42.1 39.8 40.2  PLT 173 181 197  APTT  --  48*  --   LABPROT  --  22.9* 17.4*  INR  --  2.10* 1.47  CREATININE 2.10* 2.29* 2.43*    Estimated Creatinine Clearance: 23.5 ml/min (by C-G formula based on Cr of 2.43).   Medical History: Past Medical History  Diagnosis Date  . Atrial fibrillation     Detected on an event recorder  . Coronary artery disease     a. LHC 4/12: OM1 90-95%, mid AV circumflex occluded with right to left collaterals, RCA 20-30% => b. PCI: BMS to CFX;  c. Echo 12/11: EF 40-45%, inferior HK. ;  d.  [name] Myoview 4/14:  EF 43%, large inferior and lateral scar, no ischemia, unchanged from 11/15/10; low risk  . Transient ischemic attack   . Ischemic cardiomyopathy     a. EF 40-45% by echo 2012 with inf WMA;  b. Echo 3/14: Mild LVH, EF 55-60%.  . Emphysema   . PVC (premature ventricular contraction)   . Hypertension   . ACE inhibitor intolerance     Cough  . Unspecified diastolic heart failure   . CKD (chronic kidney disease)   . HLD (hyperlipidemia)     Medications:  See electronic med rec  Assessment: 80 year old with h/o HTN, Afib, dyslipidemia, CKD, COPD.  On warfarin pta at dose of 6 mg Mon, Wed, Fri and 4 mg on all other days. INR fell to 1.47 today.    Goal of Therapy:  INR 2-3 Monitor platelets by anticoagulation protocol: Yes   Plan:  1) warfarin 6 mg po x1 2) Daily INR   Agapito Games, PharmD, BCPS Clinical Pharmacist Pager: [phone] 03/07/2014 10:06 AM

## 2014-03-07 NOTE — Progress Notes (Signed)
SATURATION QUALIFICATIONS: (This note is used to comply with regulatory documentation for home oxygen)  Patient Saturations on Room Air at Rest = 87%  Patient Saturations on Room Air while Ambulating = 81%  Patient Saturations on 6 Liters of oxygen while Ambulating = 90%

## 2014-03-07 NOTE — Progress Notes (Signed)
Physical Therapy Treatment Patient Details Name: Keith Larson MRN: [id number] DOB: 1932/06/15 Today's Date: 2014-03-21    History of Present Illness adm 4/8 with worsening shortness of breath, cough productive of white/yellow sputum, fevers and chills    PT Comments    Patient ambulated on 3 liters oxygen.  Desaturated to 84% on 3 liters but rebounded quickly with rest break.   Follow Up Recommendations  No PT follow up;Supervision - Intermittent     Equipment Recommendations  None recommended by PT    Recommendations for Other Services       Precautions / Restrictions Precautions Precautions: Fall Restrictions Weight Bearing Restrictions: No    Mobility  Bed Mobility Overal bed mobility: Modified Independent             General bed mobility comments: HOB elevated  Transfers Overall transfer level: Needs assistance Equipment used: None Transfers: Sit to/from Stand Sit to Stand: Supervision         General transfer comment: due to lines, potential for LOB  Ambulation/Gait Ambulation/Gait assistance: Supervision Ambulation Distance (Feet): 340 Feet (one extended standing rest break for 2 minutes) Assistive device: None Gait Pattern/deviations: Step-through pattern;Decreased stride length     General Gait Details: Good overall stability, fatigues with desaturation, ambulated on 3 liters   Stairs            Wheelchair Mobility    Modified Rankin (Stroke Patients Only)       Balance                                    Cognition Arousal/Alertness: Awake/alert Behavior During Therapy: WFL for tasks assessed/performed Overall Cognitive Status: Within Functional Limits for tasks assessed                      Exercises      General Comments General comments (skin integrity, edema, etc.): ambulated with oxygen, educated on pursed lip breathing, rest breaks taken for rebound of SpO2      Pertinent Vitals/Pain SpO2 on 3  liters at rest 94%, during ambulation decreased to 84% resbounded to 91 with short rest break and cues for nasal inhalation and pursed lip breathing.     Home Living                      Prior Function            PT Goals (current goals can now be found in the care plan section) Acute Rehab PT Goals Patient Stated Goal: return home without O2 required PT Goal Formulation: With patient Time For Goal Achievement: 03/13/14 Potential to Achieve Goals: Good Progress towards PT goals: Progressing toward goals    Frequency  Min 3X/week    PT Plan Current plan remains appropriate    Co-evaluation             End of Session Equipment Utilized During Treatment: Gait belt;Oxygen (3 liters) Activity Tolerance: Treatment limited secondary to medical complications (Comment) (decr SaO2 with activity) Patient left: in chair;with call bell/phone within reach     Time: 8502-7741 PT Time Calculation (min): 19 min  Charges:  $Gait Training: 8-22 mins                    G Codes:      Fabio Asa March 21, 2014, 1:42 PM Charlotte Crumb, PT DPT  [phone]

## 2014-03-07 NOTE — Progress Notes (Signed)
NURSING PROGRESS NOTE  Deniro Qadir [id number] Transfer Data: 03/07/2014 6:14 AM Attending Provider: Calvert Cantor, MD PCP:Pcp Not In System Code Status: FULL CODE   Cassiel Sharpley is a 80 y.o. male patient transferred from 2600  -No acute distress noted.  -No complaints of shortness of breath.  -No complaints of chest pain.   Cardiac Monitoring: Box # TX# 06; in place. Cardiac monitor yields:NSR  Last Documented Vital Signs: Blood pressure 140/67, pulse 65, temperature 98.3 F (36.8 C), temperature source Oral, resp. rate 15, height 5\' 8"  (1.727 m), weight 70.8 kg (156 lb 1.4 oz), SpO2 94.00%.  IV Fluids:  IV in place, occlusive dsg intact without redness, IV cath forearm right, condition patent and no redness normal saline.   Allergies:  Review of patient's allergies indicates no known allergies.  Past Medical History:   has a past medical history of Atrial fibrillation; Coronary artery disease; Transient ischemic attack; Ischemic cardiomyopathy; Emphysema; PVC (premature ventricular contraction); Hypertension; ACE inhibitor intolerance; Unspecified diastolic heart failure; CKD (chronic kidney disease); and HLD (hyperlipidemia).  Past Surgical History:   has past surgical history that includes Cholecystectomy.  Social History:   reports that he quit smoking about 10 years ago. He has never used smokeless tobacco. He reports that he drinks alcohol. He reports that he does not use illicit drugs.  Skin: NSI  Patient/Family orientated to room. Information packet given to patient/family. Admission inpatient armband information verified with patient/family to include name and date of birth and placed on patient arm. Side rails up x 2, fall assessment and education completed with patient/family. Patient/family able to verbalize understanding of risk associated with falls and verbalized understanding to call for assistance before getting out of bed. Call light within reach. Patient/family able to  voice and demonstrate understanding of unit orientation instructions.    Will continue to evaluate and treat per MD orders.

## 2014-03-08 ENCOUNTER — Inpatient Hospital Stay (HOSPITAL_COMMUNITY): Payer: Medicare Other

## 2014-03-08 LAB — CULTURE, RESPIRATORY W GRAM STAIN: Culture: NORMAL

## 2014-03-08 LAB — POTASSIUM: Potassium: 5.7 mEq/L — ABNORMAL HIGH (ref 3.7–5.3)

## 2014-03-08 LAB — CULTURE, RESPIRATORY

## 2014-03-08 LAB — BASIC METABOLIC PANEL
BUN: 45 mg/dL — AB (ref 6–23)
CALCIUM: 9.1 mg/dL (ref 8.4–10.5)
CO2: 24 mEq/L (ref 19–32)
Chloride: 101 mEq/L (ref 96–112)
Creatinine, Ser: 2.29 mg/dL — ABNORMAL HIGH (ref 0.50–1.35)
GFR calc Af Amer: 29 mL/min — ABNORMAL LOW (ref >90)
GFR, EST NON AFRICAN AMERICAN: 25 mL/min — AB (ref >90)
GLUCOSE: 143 mg/dL — AB (ref 70–99)
Potassium: 5.9 mEq/L — ABNORMAL HIGH (ref 3.7–5.3)
Sodium: 139 mEq/L (ref 137–147)

## 2014-03-08 LAB — GLUCOSE, CAPILLARY: Glucose-Capillary: 130 mg/dL — ABNORMAL HIGH (ref 70–99)

## 2014-03-08 LAB — PROTIME-INR
INR: 2.02 — ABNORMAL HIGH (ref 0.00–1.49)
Prothrombin Time: 22.2 seconds — ABNORMAL HIGH (ref 11.6–15.2)

## 2014-03-08 MED ORDER — SODIUM CHLORIDE 0.9 % IV BOLUS (SEPSIS): 250.0000 mL | Freq: Once | INTRAVENOUS | Status: DC

## 2014-03-08 MED ORDER — WARFARIN SODIUM 4 MG PO TABS
4.0000 mg | ORAL_TABLET | Freq: Once | ORAL | Status: AC
  Administered 2014-03-08: 4 mg via ORAL
  Filled 2014-03-08: qty 1

## 2014-03-08 MED ORDER — IPRATROPIUM-ALBUTEROL 0.5-2.5 (3) MG/3ML IN SOLN
3.0000 mL | RESPIRATORY_TRACT | Status: DC
  Administered 2014-03-08 (×2): 3 mL via RESPIRATORY_TRACT
  Filled 2014-03-08 (×2): qty 3

## 2014-03-08 MED ORDER — PREDNISONE 20 MG PO TABS
20.0000 mg | ORAL_TABLET | Freq: Every day | ORAL | Status: DC
Start: 2014-03-09 — End: 2014-03-08

## 2014-03-08 MED ORDER — SODIUM CHLORIDE 0.9 % IV SOLN
INTRAVENOUS | Status: DC
  Administered 2014-03-08 – 2014-03-09 (×2): via INTRAVENOUS

## 2014-03-08 MED ORDER — IPRATROPIUM-ALBUTEROL 0.5-2.5 (3) MG/3ML IN SOLN
3.0000 mL | Freq: Four times a day (QID) | RESPIRATORY_TRACT | Status: DC
  Administered 2014-03-09 (×2): 3 mL via RESPIRATORY_TRACT
  Filled 2014-03-08 (×2): qty 3

## 2014-03-08 MED ORDER — PREDNISONE 20 MG PO TABS
40.0000 mg | ORAL_TABLET | Freq: Every day | ORAL | Status: DC
  Administered 2014-03-09: 40 mg via ORAL
  Filled 2014-03-08 (×2): qty 2

## 2014-03-08 NOTE — Progress Notes (Signed)
ANTICOAGULATION CONSULT NOTE - Follow Up  Pharmacy Consult for Coumadin Indication: atrial fibrillation  No Known Allergies  Patient Measurements: Height: 5\' 8"  (172.7 cm) Weight: 160 lb 4.8 oz (72.712 kg) IBW/kg (Calculated) : 68.4  Vital Signs: Temp: 97.8 F (36.6 C) (04/11 0441) Temp src: Oral (04/11 0441) BP: 142/62 mmHg (04/11 1016) Pulse Rate: 67 (04/11 0441)  Labs:  Recent Labs  03/05/14 2014 03/06/14 0318 03/07/14 0707 03/08/14 0630  HGB 13.9 12.8* 12.9*  --   HCT 42.1 39.8 40.2  --   PLT 173 181 197  --   APTT  --  48*  --   --   LABPROT  --  22.9* 17.4* 22.2*  INR  --  2.10* 1.47 2.02*  CREATININE 2.10* 2.29* 2.43* 2.29*    Estimated Creatinine Clearance: 24.9 ml/min (by C-G formula based on Cr of 2.29).   Medical History: Past Medical History  Diagnosis Date  . Atrial fibrillation     Detected on an event recorder  . Coronary artery disease     a. LHC 4/12: OM1 90-95%, mid AV circumflex occluded with right to left collaterals, RCA 20-30% => b. PCI: BMS to CFX;  c. Echo 12/11: EF 40-45%, inferior HK. ;  d.  [name] Myoview 4/14:  EF 43%, large inferior and lateral scar, no ischemia, unchanged from 11/15/10; low risk  . Transient ischemic attack   . Ischemic cardiomyopathy     a. EF 40-45% by echo 2012 with inf WMA;  b. Echo 3/14: Mild LVH, EF 55-60%.  . Emphysema   . PVC (premature ventricular contraction)   . Hypertension   . ACE inhibitor intolerance     Cough  . Unspecified diastolic heart failure   . CKD (chronic kidney disease)   . HLD (hyperlipidemia)     Medications:  See electronic med rec  Assessment: 80 year old with h/o HTN, Afib, dyslipidemia, CKD, COPD.  On warfarin pta at dose of 6 mg Mon, Wed, Fri and 4 mg on all other days. INR jumped overnight and is therapeutic, 1.47 > 2.  No bleeding noted.  Goal of Therapy:  INR 2-3 Monitor platelets by anticoagulation protocol: Yes   Plan:  1) warfarin 4 mg po x1 2) Daily  INR  Agapito Games, PharmD, BCPS Clinical Pharmacist Pager: [phone] 03/08/2014 1:54 PM

## 2014-03-08 NOTE — Progress Notes (Addendum)
[facility] Progress Note [place] TEAM 1 - Stepdown/ICU TEAM   Keith Larson [id number]:[id number] DOB: [birth year] DOA: 03/05/2014 PCP: Pcp Not In System  Brief narrative: Keith Larson is a 80 y.o. male presenting on 03/05/2014 with past medical history of hypertension, atrial fibrillation on coumadin, dyslipidemia, CKD stage 3, COPD who presented to [facility] ED 03/05/2014 with worsening shortness of breath, cough productive of white/yellow sputum, fevers and chills for past 4 days prior to this admission. He had a temp of 100.5 and O2 sat of 89% on RA. He is found to have b/l PNA on CXR.    Subjective: No new complaints  Assessment/Plan: Principal Problem:   Acute respiratory failure with hypoxia/ Sepsis - CAP,  COPD exacerbation - cont Rocephin and Zithro - cont nebs- taper Steroids - will need to go home with O2-   Active Problems: Possible CHF exacerbation?? - h/o diastolic CHF -  given 1 x Lasix- takes it PRN at home- actually had to rehydrate him with IVF -despite hydration repeat CXR reveals improvement of infiltrates therefore, doubt infiltrates were related to CHF    AKI on CKD (chronic kidney disease) stage 3 - cont to hold diuretics -  started slow IVF as numbers worsening   Hyperkalemia - recheck tonight - should improve with hydration    Atrial fibrillation  - cont Metoprolol and Cardizem- rate controlled in 80s now - cont Coumadin    Coronary artery disease status post bare-metal stenting of the acute marginal - cont B Blocker- not on ASA??      HTN (hypertension) - cont Dilt and Metoprolol   Code Status: Full code Family Communication: with brother Disposition Plan: home   Consultants: none  Procedures: none  Antibiotics: Anti-infectives   Start     Dose/Rate Route Frequency Ordered Stop   03/07/14 2100  azithromycin (ZITHROMAX) tablet 500 mg     500 mg Oral Daily 03/07/14 1003     03/05/14 2215  cefTRIAXone (ROCEPHIN) 1 g in dextrose 5 % 50 mL IVPB     1  g 100 mL/hr over 30 Minutes Intravenous Every 24 hours 03/05/14 2214 03/12/14 2159   03/05/14 2215  azithromycin (ZITHROMAX) 500 mg in dextrose 5 % 250 mL IVPB  Status:  Discontinued     500 mg 250 mL/hr over 60 Minutes Intravenous Every 24 hours 03/05/14 2214 03/07/14 1003   03/05/14 2145  azithromycin (ZITHROMAX) 500 mg in dextrose 5 % 250 mL IVPB     500 mg 250 mL/hr over 60 Minutes Intravenous  Once 03/05/14 2144 03/05/14 2343      DVT prophylaxis: coumadin  Objective: Filed Weights   03/06/14 2159 03/07/14 0431 03/08/14 0441  Weight: 70.8 kg (156 lb 1.4 oz) 70.8 kg (156 lb 1.4 oz) 72.712 kg (160 lb 4.8 oz)   Blood pressure 137/62, pulse 68, temperature 98.1 F (36.7 C), temperature source Oral, resp. rate 20, height 5\' 8"  (1.727 m), weight 72.712 kg (160 lb 4.8 oz), SpO2 91.00%.  Intake/Output Summary (Last 24 hours) at 03/08/14 1628 Last data filed at 03/08/14 1026  Gross per 24 hour  Intake   1825 ml  Output    351 ml  Net   1474 ml     Exam: General: AAO x 3, No acute respiratory distress Lungs: coarse crackles in b/l lower lobes 3 L O2- 91% Cardiovascular: Irregular rate and rhythm without murmur gallop or rub normal S1 and S2 Abdomen: Nontender, nondistended, soft, bowel sounds positive, no rebound, no  ascites, no appreciable mass Extremities: No significant cyanosis, clubbing, or edema bilateral lower extremities  Data Reviewed: Basic Metabolic Panel:  Recent Labs Lab 03/05/14 2014 03/06/14 0318 03/07/14 0707 03/08/14 0630  NA 138 139 139 139  K 4.7 5.0 5.2 5.9*  CL 99 99 99 101  CO2 25 24 27 24   GLUCOSE 128* 180* 136* 143*  BUN 25* 27* 40* 45*  CREATININE 2.10* 2.29* 2.43* 2.29*  CALCIUM 9.0 9.0 9.0 9.1  MG  --  2.0  --   --   PHOS  --  4.0  --   --    Liver Function Tests:  Recent Labs Lab 03/06/14 0318  AST 15  ALT 11  ALKPHOS 58  BILITOT 0.2*  PROT 6.8  ALBUMIN 3.0*   No results found for this basename: LIPASE, AMYLASE,  in the last  168 hours No results found for this basename: AMMONIA,  in the last 168 hours CBC:  Recent Labs Lab 03/05/14 2014 03/06/14 0318 03/07/14 0707  WBC 11.6* 12.6* 9.0  NEUTROABS 10.0* 10.9*  --   HGB 13.9 12.8* 12.9*  HCT 42.1 39.8 40.2  MCV 91.7 92.3 91.2  PLT 173 181 197   Cardiac Enzymes: No results found for this basename: CKTOTAL, CKMB, CKMBINDEX, TROPONINI,  in the last 168 hours BNP (last 3 results)  Recent Labs  03/05/14 2014  PROBNP 3549.0*   CBG:  Recent Labs Lab 03/06/14 0831 03/07/14 0742 03/08/14 0738  GLUCAP 101* 117* 130*    Recent Results (from the past 240 hour(s))  MRSA PCR SCREENING     Status: None   Collection Time    03/06/14  2:40 AM      Result Value Ref Range Status   MRSA by PCR NEGATIVE  NEGATIVE Final   Comment:            The GeneXpert MRSA Assay (FDA     approved for NASAL specimens     only), is one component of a     comprehensive MRSA colonization     surveillance program. It is not     intended to diagnose MRSA     infection nor to guide or     monitor treatment for     MRSA infections.  URINE CULTURE     Status: None   Collection Time    03/06/14  2:42 AM      Result Value Ref Range Status   Specimen Description URINE, CLEAN CATCH   Final   Special Requests NONE   Final   Culture  Setup Time     Final   Value: 03/06/2014 08:59     [facility]formed [facility]   Colony Count     Final   Value: NO GROWTH     [facility]formed [facility]   Culture     Final   Value: NO GROWTH     Performed at [facility]   Report Status 03/07/2014 FINAL   Final  CULTURE, EXPECTORATED SPUTUM-ASSESSMENT     Status: None   Collection Time    03/06/14  3:23 AM      Result Value Ref Range Status   Specimen Description SPUTUM   Final   Special Requests NONE   Final   Sputum evaluation     Final   Value: THIS SPECIMEN IS ACCEPTABLE. RESPIRATORY CULTURE REPORT TO FOLLOW.   Report Status 03/06/2014 FINAL   Final   CULTURE, RESPIRATORY (NON-EXPECTORATED)  Status: None   Collection Time    03/06/14  3:23 AM      Result Value Ref Range Status   Specimen Description SPUTUM   Final   Special Requests NONE   Final   Gram Stain     Final   Value: ABUNDANT WBC PRESENT, PREDOMINANTLY PMN     FEW SQUAMOUS EPITHELIAL CELLS PRESENT     FEW GRAM POSITIVE COCCI     IN PAIRS IN CLUSTERS FEW GRAM VARIABLE ROD     Performed at [facility]   Culture     Final   Value: NORMAL OROPHARYNGEAL FLORA     Performed at [facility]   Report Status 03/08/2014 FINAL   Final     Studies:  Recent x-ray studies have been reviewed in detail by the Attending Physician  Scheduled Meds:  Scheduled Meds: . azithromycin  500 mg Oral Daily  . benzonatate  100 mg Oral BID  . budesonide (PULMICORT) nebulizer solution  0.25 mg Nebulization BID  . calcium carbonate  1 tablet Oral BID WC  . cefTRIAXone (ROCEPHIN)  IV  1 g Intravenous Q24H  . diltiazem  180 mg Oral Daily  . ipratropium-albuterol  3 mL Nebulization Q4H  . metoprolol  25 mg Oral BID  . predniSONE  60 mg Oral Q breakfast  . simvastatin  10 mg Oral q1800  . sodium chloride  3 mL Intravenous Q12H  . warfarin  4 mg Oral ONCE-1800  . Warfarin - Pharmacist Dosing Inpatient   Does not apply q1800   Continuous Infusions: . sodium chloride 75 mL/hr at 03/08/14 0537    Time spent on care of this patient: 35 min   Calvert Cantor, MD 03/08/2014, 4:28 PM  LOS: 3 days   [facility] Office  [phone] Pager - Text Page per Loretha Stapler   If 7PM-7AM, please contact night-coverage [email or web address]

## 2014-03-08 NOTE — Progress Notes (Signed)
Notified MD of pt's potassium level of 5.9. Orders received.

## 2014-03-09 DIAGNOSIS — N183 Chronic kidney disease, stage 3 unspecified: Secondary | ICD-10-CM

## 2014-03-09 LAB — BASIC METABOLIC PANEL
BUN: 48 mg/dL — AB (ref 6–23)
CALCIUM: 9 mg/dL (ref 8.4–10.5)
CHLORIDE: 105 meq/L (ref 96–112)
CO2: 25 meq/L (ref 19–32)
Creatinine, Ser: 2.03 mg/dL — ABNORMAL HIGH (ref 0.50–1.35)
GFR calc Af Amer: 34 mL/min — ABNORMAL LOW (ref >90)
GFR calc non Af Amer: 29 mL/min — ABNORMAL LOW (ref >90)
GLUCOSE: 116 mg/dL — AB (ref 70–99)
Potassium: 5.3 mEq/L (ref 3.7–5.3)
Sodium: 140 mEq/L (ref 137–147)

## 2014-03-09 LAB — GLUCOSE, CAPILLARY: Glucose-Capillary: 108 mg/dL — ABNORMAL HIGH (ref 70–99)

## 2014-03-09 LAB — PROTIME-INR
INR: 2.5 — ABNORMAL HIGH (ref 0.00–1.49)
Prothrombin Time: 26.2 seconds — ABNORMAL HIGH (ref 11.6–15.2)

## 2014-03-09 MED ORDER — PREDNISONE 10 MG PO TABS: 40.0000 mg | ORAL_TABLET | Freq: Every day | ORAL | Status: DC

## 2014-03-09 MED ORDER — AZITHROMYCIN 250 MG PO TABS: 250.0000 mg | ORAL_TABLET | Freq: Every day | ORAL | Status: DC

## 2014-03-09 MED ORDER — FUROSEMIDE 20 MG PO TABS: 20.0000 mg | ORAL_TABLET | Freq: Every day | ORAL | Status: AC | PRN

## 2014-03-09 MED ORDER — WARFARIN SODIUM 4 MG PO TABS
4.0000 mg | ORAL_TABLET | Freq: Once | ORAL | Status: DC
  Filled 2014-03-09: qty 1

## 2014-03-09 MED ORDER — BENZONATATE 100 MG PO CAPS: 100.0000 mg | ORAL_CAPSULE | Freq: Three times a day (TID) | ORAL | Status: DC | PRN

## 2014-03-09 NOTE — Plan of Care (Signed)
Problem: Discharge Progression Outcomes Goal: O2 sats > or equal 90% or at baseline Outcome: Not Met (add Reason) Patient has to be discharged home on oxygen

## 2014-03-09 NOTE — Progress Notes (Signed)
SATURATION QUALIFICATIONS: (This note is used to comply with regulatory documentation for home oxygen)  Patient Saturations on Room Air at Rest = 97%  Patient Saturations on Room Air while Ambulating = 88%  Patient Saturations on 2 Liters of oxygen while Ambulating = 89-90%  Please briefly explain why patient needs home oxygen: COPD

## 2014-03-09 NOTE — Progress Notes (Signed)
Patient was discharged home with brother. D/C instructions given and all questions answered. Pt has an oxygen tank at bedside that [facility]. Pt was wheelchaired out to his brothers car by and aid.

## 2014-03-09 NOTE — Progress Notes (Signed)
ANTICOAGULATION CONSULT NOTE - Follow Up  Pharmacy Consult for Coumadin Indication: atrial fibrillation  No Known Allergies  Patient Measurements: Height: 5\' 8"  (172.7 cm) Weight: 160 lb 0.9 oz (72.6 kg) IBW/kg (Calculated) : 68.4  Vital Signs: Temp: 98.2 F (36.8 C) (04/12 0608) Temp src: Oral (04/12 0608) BP: 156/74 mmHg (04/12 0932) Pulse Rate: 82 (04/12 0932)  Labs:  Recent Labs  03/07/14 0707 03/08/14 0630 03/09/14 0712  HGB 12.9*  --   --   HCT 40.2  --   --   PLT 197  --   --   LABPROT 17.4* 22.2* 26.2*  INR 1.47 2.02* 2.50*  CREATININE 2.43* 2.29* 2.03*    Estimated Creatinine Clearance: 28.1 ml/min (by C-G formula based on Cr of 2.03).   Medical History: Past Medical History  Diagnosis Date  . Atrial fibrillation     Detected on an event recorder  . Coronary artery disease     a. LHC 4/12: OM1 90-95%, mid AV circumflex occluded with right to left collaterals, RCA 20-30% => b. PCI: BMS to CFX;  c. Echo 12/11: EF 40-45%, inferior HK. ;  d.  [name] Myoview 4/14:  EF 43%, large inferior and lateral scar, no ischemia, unchanged from 11/15/10; low risk  . Transient ischemic attack   . Ischemic cardiomyopathy     a. EF 40-45% by echo 2012 with inf WMA;  b. Echo 3/14: Mild LVH, EF 55-60%.  . Emphysema   . PVC (premature ventricular contraction)   . Hypertension   . ACE inhibitor intolerance     Cough  . Unspecified diastolic heart failure   . CKD (chronic kidney disease)   . HLD (hyperlipidemia)     Medications:  See electronic med rec  Assessment: 80 year old with h/o HTN, Afib, dyslipidemia, CKD, COPD.  On warfarin pta at dose of 6 mg Mon, Wed, Fri and 4 mg on all other days. INR remains therapeutic today at 2.5.  No bleeding noted.  Goal of Therapy:  INR 2-3 Monitor platelets by anticoagulation protocol: Yes   Plan:  1) warfarin 4 mg po x1 2) Daily INR  Agapito Games, PharmD, BCPS Clinical Pharmacist Pager: [phone] 03/09/2014 11:05 AM

## 2014-03-09 NOTE — Discharge Summary (Signed)
[name] RTM:[id number] DOB: 31-Oct-1932 DOA: 03/05/2014  PCP: Pcp Not In System  Admit date: 03/05/2014 Discharge date: 03/09/2014  Time spent: >45 minutes  Recommendations for Outpatient Follow-up:  1. [facility]   Discharge Diagnoses:  Principal Problem:   Acute respiratory failure with hypoxia Active Problems:    COPD exacerbation   CAP (community acquired pneumonia)   CKD (chronic kidney disease) stage 3, GFR 30-59 ml/min   Leukocytosis   Acute diastolic CHF (congestive heart failure)   HTN (hypertension)   Dyslipidemia   Atrial fibrillation   Coronary artery disease status post bare-metal stenting of the acute marginal   Discharge Condition: stable  Diet recommendation: heart healthy    History of present illness:  Keith Larson is a 80 y.o. male presenting on 03/05/2014 with past medical history of hypertension, atrial fibrillation on coumadin, dyslipidemia, CKD stage 3, COPD who [facility] ED 03/05/2014 with worsening shortness of breath, cough productive of white/yellow sputum, fevers and chills for past 4 days prior to this admission. He had a temp of 100.5 and O2 sat of 89% on RA. He is found to have b/l PNA on CXR.    Hospital Course:  Acute respiratory failure with hypoxia/ Sepsis - CAP, COPD exacerbation  - cont Rocephin and Zithro  Administered for 4 days- Continue Zithromax and complete a 10 day course - cont inhalers at home - Prednisone taper - will need to go home with O2- 2 L at rest and with exertion - needs PFTs to assess severity of COPD   Active Problems:  Possible CHF exacerbation??  - h/o diastolic CHF - given 1 x Lasix- takes it PRN at home but he developed acute renal failure-we actually had to rehydrate him with IVF  -despite hydration repeat CXR reveals improvement of infiltrates therefore, doubt infiltrates were related to CHF   AKI on CKD (chronic kidney disease) stage 3  - cont to hold diuretics - - advised to use Furosemide  PRN only as ECHO does not reveal systolic of diastolic CHF  Hyperkalemia  -  improved with hydration   Atrial fibrillation  - cont Metoprolol and Cardizem- rate controlled in 80s now  - cont Coumadin   Coronary artery disease status post bare-metal stenting of the acute marginal  - cont B Blocker- not on ASA??   HTN (hypertension)  - cont Dilt and Metoprolol   Procedures:  none  Consultations:  none  Discharge Exam: Filed Vitals:   03/09/14 0932  BP: 156/74  Pulse: 82  Temp:   Resp:     Filed Weights   03/07/14 0431 03/08/14 0441 03/09/14 0141  Weight: 70.8 kg (156 lb 1.4 oz) 72.712 kg (160 lb 4.8 oz) 72.6 kg (160 lb 0.9 oz)    General: AAO x 3, No acute respiratory distress  Lungs: coarse crackles in b/l lower lobes 2 L O2- 92%  Cardiovascular: Irregular rate and rhythm without murmur gallop or rub normal S1 and S2  Abdomen: Nontender, nondistended, soft, bowel sounds positive, no rebound, no ascites, no appreciable mass  Extremities: No significant cyanosis, clubbing, or edema bilateral lower extremities   Discharge Instructions You were cared for by a hospitalist during your hospital stay. If you have any questions about your discharge medications or the care you received while you were in the hospital after you are discharged, you can call the unit and asked to speak with the hospitalist on call if the hospitalist that took care of  you is not available. Once you are discharged, your primary care physician will handle any further medical issues. Please note that NO REFILLS for any discharge medications will be authorized once you are discharged, as it is imperative that you return to your primary care physician (or establish a relationship with a primary care physician if you do not have one) for your aftercare needs so that they can reassess your need for medications and monitor your lab values.  Discharge Orders   Future Orders Complete By Expires   Diet - low  sodium heart healthy  As directed    Discharge patient  As directed    Increase activity slowly  As directed        Medication List         albuterol 108 (90 BASE) MCG/ACT inhaler  Commonly known as:  PROVENTIL HFA;VENTOLIN HFA  Inhale 2 puffs into the lungs 2 (two) times daily.     azithromycin 250 MG tablet  Commonly known as:  ZITHROMAX  Take 1 tablet (250 mg total) by mouth daily.     benzonatate 100 MG capsule  Commonly known as:  TESSALON  Take 1 capsule (100 mg total) by mouth 3 (three) times daily as needed for cough.     calcium carbonate 500 MG chewable tablet  Commonly known as:  TUMS - dosed in mg elemental calcium  Chew 1 tablet (200 mg of elemental calcium total) by mouth 2 (two) times daily.     diltiazem 180 MG 24 hr capsule  Commonly known as:  CARDIZEM CD  Take 1 capsule (180 mg total) by mouth daily.     furosemide 20 MG tablet  Commonly known as:  LASIX  Take 1 tablet (20 mg total) by mouth daily as needed for edema.     guaiFENesin 100 MG/5ML liquid  Commonly known as:  ROBITUSSIN  Take 200 mg by mouth 4 (four) times daily as needed for cough.     metoprolol 50 MG tablet  Commonly known as:  LOPRESSOR  Take 25 mg by mouth 2 (two) times daily.     pravastatin 20 MG tablet  Commonly known as:  PRAVACHOL  Take 20 mg by mouth daily.     predniSONE 10 MG tablet  Commonly known as:  DELTASONE  Take 4 tablets (40 mg total) by mouth daily with breakfast.     tiotropium 18 MCG inhalation capsule  Commonly known as:  SPIRIVA  Place 1 capsule (18 mcg total) into inhaler and inhale daily.     warfarin 2 MG tablet  Commonly known as:  COUMADIN  Take 4-6 mg by mouth every evening. Takes 2 tablets daily, except for Mon, Wed, Fri- takes 6mg .       No Known Allergies    The results of significant diagnostics from this hospitalization (including imaging, microbiology, ancillary and laboratory) are listed below for reference.    Significant  Diagnostic Studies: Dg Chest Port 1 View  03/08/2014   CLINICAL DATA:  Hypoxia.  EXAM: PORTABLE CHEST - 1 VIEW  COMPARISON:  03/05/2014  FINDINGS: Cardiac silhouette is normal in size. Normal mediastinal and hilar contours.  Vascular congestion interstitial thickening has improved from the prior study there are no areas consolidation. No pleural effusion or pneumothorax.  Bony thorax is intact.  IMPRESSION: 1. Improved bilateral interstitial thickening. The interstitial thickening may have been due to interstitial edema. 2. There is now no acute cardiopulmonary disease.   Electronically Signed   By:  Amie Portland M.D.   On: 03/08/2014 12:31   Dg Chest Portable 1 View  03/05/2014   CLINICAL DATA:  Productive cough.  Fever.  EXAM: PORTABLE CHEST - 1 VIEW  COMPARISON:  DG CHEST 1V PORT dated 02/05/2013  FINDINGS: Cardiomegaly and mild pulmonary vascular prominence noted. Component congestive heart failure cannot be excluded. Dense bilateral pulmonary interstitial infiltrates. Given patient's history of cough and fever bilateral severe pneumonitis is suspected. No pleural effusion or pneumothorax.  IMPRESSION: 1. Dense bilateral pulmonary interstitial infiltrates suggesting pneumonitis , particularly given the patient's history of cough and fever. 2. Mild cardiomegaly pulmonary venous congestion. Component of congestive heart failure cannot be entirely excluded .   Electronically Signed   By: Maisie Fus  Register   On: 03/05/2014 21:29    Microbiology: Recent Results (from the past 240 hour(s))  MRSA PCR SCREENING     Status: None   Collection Time    03/06/14  2:40 AM      Result Value Ref Range Status   MRSA by PCR NEGATIVE  NEGATIVE Final   Comment:            The GeneXpert MRSA Assay (FDA     approved for NASAL specimens     only), is one component of a     comprehensive MRSA colonization     surveillance program. It is not     intended to diagnose MRSA     infection nor to guide or     monitor  treatment for     MRSA infections.  URINE CULTURE     Status: None   Collection Time    03/06/14  2:42 AM      Result Value Ref Range Status   Specimen Description URINE, CLEAN CATCH   Final   Special Requests NONE   Final   Culture  Setup Time     Final   Value: 03/06/2014 08:59     Performed at [facility] Count     Final   Value: NO GROWTH     Performed at [facility]   Culture     Final   Value: NO GROWTH     Performed at [facility]   Report Status 03/07/2014 FINAL   Final  CULTURE, EXPECTORATED SPUTUM-ASSESSMENT     Status: None   Collection Time    03/06/14  3:23 AM      Result Value Ref Range Status   Specimen Description SPUTUM   Final   Special Requests NONE   Final   Sputum evaluation     Final   Value: THIS SPECIMEN IS ACCEPTABLE. RESPIRATORY CULTURE REPORT TO FOLLOW.   Report Status 03/06/2014 FINAL   Final  CULTURE, RESPIRATORY (NON-EXPECTORATED)     Status: None   Collection Time    03/06/14  3:23 AM      Result Value Ref Range Status   Specimen Description SPUTUM   Final   Special Requests NONE   Final   Gram Stain     Final   Value: ABUNDANT WBC PRESENT, PREDOMINANTLY PMN     FEW SQUAMOUS EPITHELIAL CELLS PRESENT     FEW GRAM POSITIVE COCCI     IN PAIRS IN CLUSTERS FEW GRAM VARIABLE ROD     Performed at [facility]   Culture     Final   Value: NORMAL OROPHARYNGEAL FLORA     Performed at [facility]   Report Status  03/08/2014 FINAL   Final     Labs: Basic Metabolic Panel:  Recent Labs Lab 03/05/14 2014 03/06/14 0318 03/07/14 0707 03/08/14 0630 03/08/14 2115 03/09/14 0712  NA 138 139 139 139  --  140  K 4.7 5.0 5.2 5.9* 5.7* 5.3  CL 99 99 99 101  --  105  CO2 25 24 27 24   --  25  GLUCOSE 128* 180* 136* 143*  --  116*  BUN 25* 27* 40* 45*  --  48*  CREATININE 2.10* 2.29* 2.43* 2.29*  --  2.03*  CALCIUM 9.0 9.0 9.0 9.1  --  9.0  MG  --  2.0  --   --   --   --   PHOS  --  4.0  --   --    --   --    Liver Function Tests:  Recent Labs Lab 03/06/14 0318  AST 15  ALT 11  ALKPHOS 58  BILITOT 0.2*  PROT 6.8  ALBUMIN 3.0*   No results found for this basename: LIPASE, AMYLASE,  in the last 168 hours No results found for this basename: AMMONIA,  in the last 168 hours CBC:  Recent Labs Lab 03/05/14 2014 03/06/14 0318 03/07/14 0707  WBC 11.6* 12.6* 9.0  NEUTROABS 10.0* 10.9*  --   HGB 13.9 12.8* 12.9*  HCT 42.1 39.8 40.2  MCV 91.7 92.3 91.2  PLT 173 181 197   Cardiac Enzymes: No results found for this basename: CKTOTAL, CKMB, CKMBINDEX, TROPONINI,  in the last 168 hours BNP: BNP (last 3 results)  Recent Labs  03/05/14 2014  PROBNP 3549.0*   CBG:  Recent Labs Lab 03/06/14 0831 03/07/14 0742 03/08/14 0738 03/09/14 0815  GLUCAP 101* 117* 130* 108*       Signed:  Calvert Cantor, MD [facility] 03/09/2014, 12:37 PM

## 2014-03-09 NOTE — Plan of Care (Signed)
Problem: Phase II Progression Outcomes Goal: O2 sats > equal to 90% on RA or at baseline Outcome: Not Applicable Date Met:  60/73/71 Patient is >90 on 3-4 L

## 2014-03-09 NOTE — Plan of Care (Signed)
Problem: Phase I Progression Outcomes Goal: O2 sats > or equal 90% or at baseline Outcome: Completed/Met Date Met:  03/09/14 On oxygen

## 2014-09-13 ENCOUNTER — Emergency Department (HOSPITAL_COMMUNITY): Payer: Medicare Other

## 2014-09-13 ENCOUNTER — Inpatient Hospital Stay (HOSPITAL_COMMUNITY)
Admission: EM | Admit: 2014-09-13 | Discharge: 2014-09-15 | DRG: 558 | Disposition: A | Discharge status: Home / Self Care | Payer: Medicare Other | Attending: Internal Medicine | Admitting: Internal Medicine

## 2014-09-13 ENCOUNTER — Encounter (HOSPITAL_COMMUNITY): Payer: Self-pay | Admitting: Emergency Medicine

## 2014-09-13 DIAGNOSIS — J441 Chronic obstructive pulmonary disease with (acute) exacerbation: Secondary | ICD-10-CM | POA: Diagnosis not present

## 2014-09-13 DIAGNOSIS — I5032 Chronic diastolic (congestive) heart failure: Secondary | ICD-10-CM | POA: Diagnosis present

## 2014-09-13 DIAGNOSIS — N289 Disorder of kidney and ureter, unspecified: Secondary | ICD-10-CM

## 2014-09-13 DIAGNOSIS — Z9861 Coronary angioplasty status: Secondary | ICD-10-CM

## 2014-09-13 DIAGNOSIS — Z9049 Acquired absence of other specified parts of digestive tract: Secondary | ICD-10-CM | POA: Diagnosis present

## 2014-09-13 DIAGNOSIS — Z7901 Long term (current) use of anticoagulants: Secondary | ICD-10-CM

## 2014-09-13 DIAGNOSIS — Z87891 Personal history of nicotine dependence: Secondary | ICD-10-CM

## 2014-09-13 DIAGNOSIS — M6282 Rhabdomyolysis: Principal | ICD-10-CM

## 2014-09-13 DIAGNOSIS — I4819 Other persistent atrial fibrillation: Secondary | ICD-10-CM

## 2014-09-13 DIAGNOSIS — Z9981 Dependence on supplemental oxygen: Secondary | ICD-10-CM

## 2014-09-13 DIAGNOSIS — W06XXXA Fall from bed, initial encounter: Secondary | ICD-10-CM | POA: Diagnosis present

## 2014-09-13 DIAGNOSIS — I10 Essential (primary) hypertension: Secondary | ICD-10-CM

## 2014-09-13 DIAGNOSIS — N184 Chronic kidney disease, stage 4 (severe): Secondary | ICD-10-CM | POA: Diagnosis present

## 2014-09-13 DIAGNOSIS — I255 Ischemic cardiomyopathy: Secondary | ICD-10-CM | POA: Diagnosis present

## 2014-09-13 DIAGNOSIS — Z7952 Long term (current) use of systemic steroids: Secondary | ICD-10-CM

## 2014-09-13 DIAGNOSIS — E785 Hyperlipidemia, unspecified: Secondary | ICD-10-CM | POA: Diagnosis present

## 2014-09-13 DIAGNOSIS — Z9109 Other allergy status, other than to drugs and biological substances: Secondary | ICD-10-CM

## 2014-09-13 DIAGNOSIS — I129 Hypertensive chronic kidney disease with stage 1 through stage 4 chronic kidney disease, or unspecified chronic kidney disease: Secondary | ICD-10-CM | POA: Diagnosis present

## 2014-09-13 DIAGNOSIS — I4891 Unspecified atrial fibrillation: Secondary | ICD-10-CM | POA: Diagnosis not present

## 2014-09-13 DIAGNOSIS — N189 Chronic kidney disease, unspecified: Secondary | ICD-10-CM

## 2014-09-13 DIAGNOSIS — R821 Myoglobinuria: Secondary | ICD-10-CM

## 2014-09-13 DIAGNOSIS — Z8673 Personal history of transient ischemic attack (TIA), and cerebral infarction without residual deficits: Secondary | ICD-10-CM

## 2014-09-13 DIAGNOSIS — R531 Weakness: Secondary | ICD-10-CM

## 2014-09-13 DIAGNOSIS — M25552 Pain in left hip: Secondary | ICD-10-CM | POA: Diagnosis present

## 2014-09-13 DIAGNOSIS — W19XXXA Unspecified fall, initial encounter: Secondary | ICD-10-CM

## 2014-09-13 DIAGNOSIS — Z79899 Other long term (current) drug therapy: Secondary | ICD-10-CM

## 2014-09-13 DIAGNOSIS — I2510 Atherosclerotic heart disease of native coronary artery without angina pectoris: Secondary | ICD-10-CM | POA: Diagnosis present

## 2014-09-13 DIAGNOSIS — I493 Ventricular premature depolarization: Secondary | ICD-10-CM | POA: Diagnosis present

## 2014-09-13 HISTORY — DX: Shortness of breath: R06.02

## 2014-09-13 LAB — COMPREHENSIVE METABOLIC PANEL WITH GFR
ALT: 44 U/L (ref 0–53)
AST: 184 U/L — ABNORMAL HIGH (ref 0–37)
Albumin: 3.1 g/dL — ABNORMAL LOW (ref 3.5–5.2)
Alkaline Phosphatase: 53 U/L (ref 39–117)
Anion gap: 10 (ref 5–15)
BUN: 29 mg/dL — ABNORMAL HIGH (ref 6–23)
CO2: 30 meq/L (ref 19–32)
Calcium: 8.7 mg/dL (ref 8.4–10.5)
Chloride: 102 meq/L (ref 96–112)
Creatinine, Ser: 2.18 mg/dL — ABNORMAL HIGH (ref 0.50–1.35)
GFR calc Af Amer: 31 mL/min — ABNORMAL LOW
GFR calc non Af Amer: 27 mL/min — ABNORMAL LOW
Glucose, Bld: 111 mg/dL — ABNORMAL HIGH (ref 70–99)
Potassium: 4.5 meq/L (ref 3.7–5.3)
Sodium: 142 meq/L (ref 137–147)
Total Bilirubin: 0.3 mg/dL (ref 0.3–1.2)
Total Protein: 6.4 g/dL (ref 6.0–8.3)

## 2014-09-13 LAB — URINALYSIS, ROUTINE W REFLEX MICROSCOPIC
Bilirubin Urine: NEGATIVE
Glucose, UA: NEGATIVE mg/dL
Ketones, ur: NEGATIVE mg/dL
Nitrite: NEGATIVE
Protein, ur: >300 mg/dL — AB
Specific Gravity, Urine: 1.019 (ref 1.005–1.030)
Urobilinogen, UA: 0.2 mg/dL (ref 0.0–1.0)
pH: 7.5 (ref 5.0–8.0)

## 2014-09-13 LAB — CBC
HCT: 39 % (ref 39.0–52.0)
Hemoglobin: 12.3 g/dL — ABNORMAL LOW (ref 13.0–17.0)
MCH: 28.9 pg (ref 26.0–34.0)
MCHC: 31.5 g/dL (ref 30.0–36.0)
MCV: 91.5 fL (ref 78.0–100.0)
Platelets: 176 K/uL (ref 150–400)
RBC: 4.26 MIL/uL (ref 4.22–5.81)
RDW: 14.1 % (ref 11.5–15.5)
WBC: 7.9 K/uL (ref 4.0–10.5)

## 2014-09-13 LAB — URINE MICROSCOPIC-ADD ON

## 2014-09-13 LAB — PROTIME-INR
INR: 2.34 — ABNORMAL HIGH (ref 0.00–1.49)
Prothrombin Time: 25.8 s — ABNORMAL HIGH (ref 11.6–15.2)

## 2014-09-13 LAB — BASIC METABOLIC PANEL
ANION GAP: 11 (ref 5–15)
BUN: 29 mg/dL — ABNORMAL HIGH (ref 6–23)
CHLORIDE: 104 meq/L (ref 96–112)
CO2: 25 mEq/L (ref 19–32)
Calcium: 8.4 mg/dL (ref 8.4–10.5)
Creatinine, Ser: 1.98 mg/dL — ABNORMAL HIGH (ref 0.50–1.35)
GFR calc non Af Amer: 30 mL/min — ABNORMAL LOW (ref >90)
GFR, EST AFRICAN AMERICAN: 35 mL/min — AB (ref >90)
Glucose, Bld: 86 mg/dL (ref 70–99)
Potassium: 4.4 mEq/L (ref 3.7–5.3)
Sodium: 140 mEq/L (ref 137–147)

## 2014-09-13 LAB — CK: Total CK: 12083 U/L — ABNORMAL HIGH (ref 7–232)

## 2014-09-13 LAB — TROPONIN I
Troponin I: <0.3 ng/mL (ref <0.30)
Troponin I: <0.3 ng/mL (ref <0.30)

## 2014-09-13 MED ORDER — GUAIFENESIN 100 MG/5ML PO LIQD
200.0000 mg | Freq: Four times a day (QID) | ORAL | Status: DC | PRN
  Administered 2014-09-14: 200 mg via ORAL
  Filled 2014-09-13 (×3): qty 10

## 2014-09-13 MED ORDER — SODIUM CHLORIDE 0.9 % IV BOLUS (SEPSIS)
1000.0000 mL | Freq: Once | INTRAVENOUS | Status: AC
  Administered 2014-09-13: 1000 mL via INTRAVENOUS

## 2014-09-13 MED ORDER — SODIUM CHLORIDE 0.9 % IV SOLN
INTRAVENOUS | Status: DC
  Administered 2014-09-13: 11:00:00 via INTRAVENOUS

## 2014-09-13 MED ORDER — BUDESONIDE-FORMOTEROL FUMARATE 160-4.5 MCG/ACT IN AERO
1.0000 | INHALATION_SPRAY | Freq: Two times a day (BID) | RESPIRATORY_TRACT | Status: DC
  Administered 2014-09-13 – 2014-09-15 (×4): 1 via RESPIRATORY_TRACT
  Filled 2014-09-13: qty 6

## 2014-09-13 MED ORDER — SODIUM CHLORIDE 0.9 % IJ SOLN
3.0000 mL | Freq: Two times a day (BID) | INTRAMUSCULAR | Status: DC
  Administered 2014-09-14: 3 mL via INTRAVENOUS

## 2014-09-13 MED ORDER — SODIUM CHLORIDE 0.9 % IV BOLUS (SEPSIS)
500.0000 mL | Freq: Once | INTRAVENOUS | Status: AC
  Administered 2014-09-13: 500 mL via INTRAVENOUS

## 2014-09-13 MED ORDER — WARFARIN SODIUM 2 MG PO TABS
2.0000 mg | ORAL_TABLET | Freq: Once | ORAL | Status: AC
  Administered 2014-09-13: 2 mg via ORAL
  Filled 2014-09-13 (×2): qty 1

## 2014-09-13 MED ORDER — IPRATROPIUM-ALBUTEROL 0.5-2.5 (3) MG/3ML IN SOLN: 3.0000 mL | RESPIRATORY_TRACT | Status: DC | PRN

## 2014-09-13 MED ORDER — SODIUM CHLORIDE 0.9 % IV SOLN
INTRAVENOUS | Status: DC
  Administered 2014-09-14 – 2014-09-15 (×5): via INTRAVENOUS

## 2014-09-13 MED ORDER — DEXTROSE 5 % IV SOLN: 1.0000 g | Freq: Once | INTRAVENOUS | Status: DC

## 2014-09-13 MED ORDER — IPRATROPIUM-ALBUTEROL 0.5-2.5 (3) MG/3ML IN SOLN
3.0000 mL | RESPIRATORY_TRACT | Status: DC
  Administered 2014-09-13: 3 mL via RESPIRATORY_TRACT
  Filled 2014-09-13: qty 3

## 2014-09-13 MED ORDER — ACETAMINOPHEN 650 MG RE SUPP: 650.0000 mg | Freq: Four times a day (QID) | RECTAL | Status: DC | PRN

## 2014-09-13 MED ORDER — WARFARIN SODIUM 3 MG PO TABS
3.0000 mg | ORAL_TABLET | Freq: Once | ORAL | Status: DC
Start: 2014-09-13 — End: 2014-09-13
  Filled 2014-09-13: qty 1

## 2014-09-13 MED ORDER — METOPROLOL TARTRATE 25 MG PO TABS
25.0000 mg | ORAL_TABLET | Freq: Two times a day (BID) | ORAL | Status: DC
  Administered 2014-09-13 – 2014-09-15 (×4): 25 mg via ORAL
  Filled 2014-09-13 (×5): qty 1

## 2014-09-13 MED ORDER — IPRATROPIUM-ALBUTEROL 0.5-2.5 (3) MG/3ML IN SOLN
3.0000 mL | Freq: Three times a day (TID) | RESPIRATORY_TRACT | Status: DC
  Administered 2014-09-14 – 2014-09-15 (×3): 3 mL via RESPIRATORY_TRACT
  Filled 2014-09-13 (×3): qty 3

## 2014-09-13 MED ORDER — DEXTROSE 5 % IV SOLN: 500.0000 mg | Freq: Once | INTRAVENOUS | Status: DC

## 2014-09-13 MED ORDER — WARFARIN - PHARMACIST DOSING INPATIENT: Freq: Every day | Status: DC

## 2014-09-13 MED ORDER — PREDNISONE 20 MG PO TABS
40.0000 mg | ORAL_TABLET | Freq: Every day | ORAL | Status: DC
Start: 2014-09-14 — End: 2014-09-15
  Administered 2014-09-14 – 2014-09-15 (×2): 40 mg via ORAL
  Filled 2014-09-13 (×3): qty 2

## 2014-09-13 MED ORDER — ACETAMINOPHEN 325 MG PO TABS: 650.0000 mg | ORAL_TABLET | Freq: Four times a day (QID) | ORAL | Status: DC | PRN

## 2014-09-13 MED ORDER — PRAVASTATIN SODIUM 20 MG PO TABS
20.0000 mg | ORAL_TABLET | Freq: Every day | ORAL | Status: DC
  Administered 2014-09-13 – 2014-09-15 (×3): 20 mg via ORAL
  Filled 2014-09-13 (×4): qty 1

## 2014-09-13 NOTE — ED Notes (Addendum)
pts brother is Colon and phone # [phone], would like to be notified with bed assignment

## 2014-09-13 NOTE — ED Notes (Signed)
Patient transported to X-ray 

## 2014-09-13 NOTE — ED Notes (Signed)
Admitting md at bedside

## 2014-09-13 NOTE — ED Provider Notes (Addendum)
CSN: [id number]     Arrival date & time 09/13/14  0903 History   First MD Initiated Contact with Patient 09/13/14 [phone]     Chief Complaint  Patient presents with  . Fall     (Consider location/radiation/quality/duration/timing/severity/associated sxs/prior Treatment) Patient is a 80 y.o. male presenting with fall. The history is provided by the patient.  Fall Pertinent negatives include no chest pain, no abdominal pain, no headaches and no shortness of breath.  pt with hx copd, home o2, afib on coumadin, states rolled from bed to ground at some point early in the night last night, when getting ready for bed,  and could not get back up into bed on his own.  Pt laid on floor throughout the night and early morning hours as unable to get up on own.   Pt c/o pain left hip, and pain in bil thighs/hamstring area. Dull, moderate. Denies other pain or injury. No headache. No neck or back pain. No chest pain or increased sob. No abd pain or nv. Denies extremity numbness/weakness.      Past Medical History  Diagnosis Date  . Atrial fibrillation     Detected on an event recorder  . Coronary artery disease     a. LHC 4/12: OM1 90-95%, mid AV circumflex occluded with right to left collaterals, RCA 20-30% => b. PCI: BMS to CFX;  c. Echo 12/11: EF 40-45%, inferior HK. ;  d.  Eugenie BirksLexiscan Myoview 4/14:  EF 43%, large inferior and lateral scar, no ischemia, unchanged from 11/15/10; low risk  . Transient ischemic attack   . Ischemic cardiomyopathy     a. EF 40-45% by echo 2012 with inf WMA;  b. Echo 3/14: Mild LVH, EF 55-60%.  . Emphysema   . PVC (premature ventricular contraction)   . Hypertension   . ACE inhibitor intolerance     Cough  . Unspecified diastolic heart failure   . CKD (chronic kidney disease)   . HLD (hyperlipidemia)    Past Surgical History  Procedure Laterality Date  . Cholecystectomy     Family History  Problem Relation Age of Onset  . Diabetes Mother   . Heart disease Neg Hx    . Heart attack Neg Hx    History  Substance Use Topics  . Smoking status: Former Smoker -- 1.00 packs/day for 50 years    Quit date: 02/12/2004  . Smokeless tobacco: Never Used  . Alcohol Use: Yes     Comment: several alcoholic beverages a week    Review of Systems  Constitutional: Negative for fever and chills.  HENT: Negative for sore throat and trouble swallowing.   Eyes: Negative for redness and visual disturbance.  Respiratory: Negative for cough and shortness of breath.   Cardiovascular: Negative for chest pain and leg swelling.  Gastrointestinal: Negative for vomiting, abdominal pain and diarrhea.  Endocrine: Negative for polyuria.  Genitourinary: Negative for dysuria and flank pain.  Musculoskeletal: Negative for back pain and neck pain.  Skin: Negative for wound.  Neurological: Negative for weakness, numbness and headaches.  Hematological: Does not bruise/bleed easily.  Psychiatric/Behavioral: Negative for confusion.      Allergies  Review of patient's allergies indicates no known allergies.  Home Medications   Prior to Admission medications   Medication Sig Start Date End Date Taking? Authorizing Provider  albuterol (PROVENTIL HFA;VENTOLIN HFA) 108 (90 BASE) MCG/ACT inhaler Inhale 2 puffs into the lungs 2 (two) times daily. 02/11/13  Yes [name] Luanne Bras [name], MD  metoprolol (  LOPRESSOR) 50 MG tablet Take 25 mg by mouth 2 (two) times daily.   Yes Historical Provider, MD  pravastatin (PRAVACHOL) 20 MG tablet Take 20 mg by mouth daily.   Yes Historical Provider, MD  predniSONE (DELTASONE) 10 MG tablet Take 4 tablets (40 mg total) by mouth daily with breakfast. 03/09/14  Yes Calvert Cantor, MD  tiotropium (SPIRIVA) 18 MCG inhalation capsule Place 1 capsule (18 mcg total) into inhaler and inhale daily. 02/11/13  Yes [name] Luanne Bras, MD  furosemide (LASIX) 20 MG tablet Take 1 tablet (20 mg total) by mouth daily as needed for edema. 03/09/14   Calvert Cantor, MD  guaiFENesin (ROBITUSSIN)  100 MG/5ML liquid Take 200 mg by mouth 4 (four) times daily as needed for cough.    Historical Provider, MD  warfarin (COUMADIN) 2 MG tablet Take 4-6 mg by mouth every evening. Takes 2 tablets daily, except for Mon, Wed, Fri- takes 6mg .    Historical Provider, MD   BP 131/71  Pulse 101  Temp(Src) 97.9 F (36.6 C) (Oral)  Resp 16  SpO2 94% Physical Exam  Nursing note and vitals reviewed. Constitutional: He is oriented to person, place, and time. He appears well-developed and well-nourished. No distress.  HENT:  Head: Atraumatic.  Mouth/Throat: Oropharynx is clear and moist.  Eyes: Conjunctivae are normal. Pupils are equal, round, and reactive to light. No scleral icterus.  Neck: Normal range of motion. Neck supple. No tracheal deviation present.  No bruit  Cardiovascular: Normal rate, normal heart sounds and intact distal pulses.   Pulmonary/Chest: Effort normal and breath sounds normal. No accessory muscle usage. No respiratory distress.  Abdominal: Soft. Bowel sounds are normal. He exhibits no distension and no mass. There is no tenderness. There is no rebound and no guarding.  Genitourinary:  No cva tenderness  Musculoskeletal: Normal range of motion. He exhibits no edema and no tenderness.  Tenderness w mild pain w rom left hip, otherwise good rom bil ext without pain or focal bony tenderness. CTLS spine, non tender, aligned, no step off.   Neurological: He is alert and oriented to person, place, and time. No cranial nerve deficit.  Skin: Skin is warm and dry. No rash noted. He is not diaphoretic.  Psychiatric: He has a normal mood and affect.    ED Course  Procedures (including critical care time) Labs Review  Results for orders placed during the hospital encounter of 09/13/14  PROTIME-INR      Result Value Ref Range   Prothrombin Time 25.8 (*) 11.6 - 15.2 seconds   INR 2.34 (*) 0.00 - 1.49  CBC      Result Value Ref Range   WBC 7.9  4.0 - 10.5 K/uL   RBC 4.26  4.22 -  5.81 MIL/uL   Hemoglobin 12.3 (*) 13.0 - 17.0 g/dL   HCT 00.3  70.4 - 88.8 %   MCV 91.5  78.0 - 100.0 fL   MCH 28.9  26.0 - 34.0 pg   MCHC 31.5  30.0 - 36.0 g/dL   RDW 91.6  94.5 - 03.8 %   Platelets 176  150 - 400 K/uL  COMPREHENSIVE METABOLIC PANEL      Result Value Ref Range   Sodium 142  137 - 147 mEq/L   Potassium 4.5  3.7 - 5.3 mEq/L   Chloride 102  96 - 112 mEq/L   CO2 30  19 - 32 mEq/L   Glucose, Bld 111 (*) 70 - 99 mg/dL   BUN  29 (*) 6 - 23 mg/dL   Creatinine, Ser 1.61 (*) 0.50 - 1.35 mg/dL   Calcium 8.7  8.4 - 09.6 mg/dL   Total Protein 6.4  6.0 - 8.3 g/dL   Albumin 3.1 (*) 3.5 - 5.2 g/dL   AST 045 (*) 0 - 37 U/L   ALT 44  0 - 53 U/L   Alkaline Phosphatase 53  39 - 117 U/L   Total Bilirubin 0.3  0.3 - 1.2 mg/dL   GFR calc non Af Amer 27 (*) >90 mL/min   GFR calc Af Amer 31 (*) >90 mL/min   Anion gap 10  5 - 15  URINALYSIS, ROUTINE W REFLEX MICROSCOPIC      Result Value Ref Range   Color, Urine AMBER (*) YELLOW   APPearance CLEAR  CLEAR   Specific Gravity, Urine 1.019  1.005 - 1.030   pH 7.5  5.0 - 8.0   Glucose, UA NEGATIVE  NEGATIVE mg/dL   Hgb urine dipstick LARGE (*) NEGATIVE   Bilirubin Urine NEGATIVE  NEGATIVE   Ketones, ur NEGATIVE  NEGATIVE mg/dL   Protein, ur >409 (*) NEGATIVE mg/dL   Urobilinogen, UA 0.2  0.0 - 1.0 mg/dL   Nitrite NEGATIVE  NEGATIVE   Leukocytes, UA TRACE (*) NEGATIVE  CK      Result Value Ref Range   Total CK 12083 (*) 7 - 232 U/L  TROPONIN I      Result Value Ref Range   Troponin I <0.30  <0.30 ng/mL  URINE MICROSCOPIC-ADD ON      Result Value Ref Range   Squamous Epithelial / LPF RARE  RARE   WBC, UA 3-6  <3 WBC/hpf   RBC / HPF 7-10  <3 RBC/hpf   Bacteria, UA FEW (*) RARE   Casts GRANULAR CAST (*) NEGATIVE   Urine-Other MUCOUS PRESENT     Dg Chest 2 View  09/13/2014   CLINICAL DATA:  Shortness of breath for 1 year. Status post fall last night.  EXAM: CHEST  2 VIEW  COMPARISON:  Single view of the chest 03/08/2014 and  02/05/2013.  FINDINGS: The chest is hyperexpanded but the lungs are clear. Peribronchial thickening is noted. No pneumothorax or pleural effusion. Heart size is upper normal.  IMPRESSION: Findings compatible with COPD.  No acute abnormality.   Electronically Signed   By: Drusilla Kanner M.D.   On: 09/13/2014 12:47   Dg Hip Complete Left  09/13/2014   CLINICAL DATA:  Status post fall from bed. Left hip pain. Initial encounter.  EXAM: LEFT HIP - COMPLETE 2+ VIEW  COMPARISON:  None.  FINDINGS: No acute bony or joint abnormality is identified. Mild degenerative change is present about the hips. No focal bony lesion.  IMPRESSION: No acute abnormality.   Electronically Signed   By: Drusilla Kanner M.D.   On: 09/13/2014 14:07   Ct Head Wo Contrast  09/13/2014   CLINICAL DATA:  Patient found on floor.  EXAM: CT HEAD WITHOUT CONTRAST  TECHNIQUE: Contiguous axial images were obtained from the base of the skull through the vertex without contrast.  COMPARISON:  11/04/2010  FINDINGS: No evidence for acute hemorrhage, mass lesion, midline shift, hydrocephalus or large infarct. There is low density in the periventricular white matter suggesting chronic changes. Mild cerebral atrophy is unchanged. Mucosal thickening in the right maxillary sinus is likely chronic. Polyp or retention cyst in the anterior left sphenoid sinus. Mucosal thickening in the ethmoid air cells. No acute bone abnormality.  IMPRESSION: No acute intracranial abnormality.  Low density in the periventricular white matter suggests chronic small vessel ischemic changes.  Paranasal sinus disease.   Electronically Signed   By: Richarda Overlie M.D.   On: 09/13/2014 11:05       EKG Interpretation   Date/Time:  Saturday September 13 2014 09:16:12 EDT Ventricular Rate:  115 PR Interval:  178 QRS Duration: 88 QT Interval:  322 QTC Calculation: 445 R Axis:   100 Text Interpretation:  Sinus tachycardia with occasional Premature  ventricular complexes  Rightward axis Nonspecific ST and T wave abnormality  Baseline wander No significant change since last tracing Confirmed by  Shawntina Diffee  MD, Caryn Bee ([id number]) on 09/13/2014 9:23:44 AM      MDM  Iv ns. Labs. Xrays.  Reviewed nursing notes and prior charts for additional history.   Given coumadin, fall, ?head injury, will get ct.  Recheck spine nt.  No new c/o.  Total ck elevated, ua dip pos blood - will give ivf boluses ?mild rhabdo.  Discussed pt with tsb - will admit       Suzi Roots, MD 09/13/14 [phone]

## 2014-09-13 NOTE — H&P (Signed)
Date: 09/13/2014               Patient Name:  Keith PellegriniFrank Larson MRN: [id number]  DOB: [birth year] Age / Sex: 80 y.o., male   PCP: Pcp Not In System         Medical Service: Internal Medicine Teaching Service         Attending Physician: Dr. Rocco SereneLawrence D Klima, MD    First Contact: Dr. Rich Numberarly Mayco Walrond Pager: [phone]  Second Contact: Dr. Evelena PeatAlex Wilson Pager: [phone]       After Hours (After 5p/  First Contact Pager: [phone]  weekends / holidays): Second Contact Pager: [phone]   Chief Complaint: Fall  History of Present Illness: Keith Larson is a 80yo man w/ PMHx of AFib on coumadin, CAD s/p cardiac cath in 2012, TIA, ischemic CMO (last echo in 01/2013 showed EF 55-60% with no wall motion abnormalities, last Myoview in 2014 was low risk), COPD, HTN, CKD Stage IV, and HLD who presented to the ED after having a fall last night. Patient reports he went to bed and when he woke up he was on the floor. He states he did not feel well last night. He does not remember how he got onto the floor and does not believe he hit his head. He states he has some pain on his left ribs and his left hip. He states he tried to get up after falling but was not able to move. He reports he had to wait until his family came over before he was able to get up off the floor. He denies dizziness, headache, slurred speech, chest pain, abdominal pain, nausea, vomiting. He reports good PO intake.   Patient states he has been coughing a lot the past 2 weeks. He reports a productive cough with green sputum. He states he is on 2 L of oxygen at home. He reports SOB that is near his baseline, but has noticed a lot of wheezing. He denies fever, chills.   In the ED, patient found to have a CK of 7425912000 and a large amount of Hbg in his urine. His Cr was 2.18, which is his baseline. He received a 1.5 L bolus.  An x-ray of the left hip showed no acute fracture. CXR showed hyperexpanded lung fields which is consistent with COPD.  Meds: Current  Facility-Administered Medications  Medication Dose Route Frequency Provider Last Rate Last Dose  . 0.9 %  sodium chloride infusion   Intravenous Continuous Suzi RootsKevin E Steinl, MD      . ipratropium-albuterol (DUONEB) 0.5-2.5 (3) MG/3ML nebulizer solution 3 mL  3 mL Nebulization Q4H Yolanda MangesAlex M Wilson, DO       Current Outpatient Prescriptions  Medication Sig Dispense Refill  . albuterol (PROVENTIL HFA;VENTOLIN HFA) 108 (90 BASE) MCG/ACT inhaler Inhale 2 puffs into the lungs 2 (two) times daily.  1 Inhaler  0  . metoprolol (LOPRESSOR) 50 MG tablet Take 25 mg by mouth 2 (two) times daily.      . pravastatin (PRAVACHOL) 20 MG tablet Take 20 mg by mouth daily.      . predniSONE (DELTASONE) 10 MG tablet Take 4 tablets (40 mg total) by mouth daily with breakfast.  11 tablet  0  . tiotropium (SPIRIVA) 18 MCG inhalation capsule Place 1 capsule (18 mcg total) into inhaler and inhale daily.  30 capsule  0  . furosemide (LASIX) 20 MG tablet Take 1 tablet (20 mg total) by mouth daily as needed for edema.  30 tablet  0  . guaiFENesin (ROBITUSSIN) 100 MG/5ML liquid Take 200 mg by mouth 4 (four) times daily as needed for cough.      . SYMBICORT 160-4.5 MCG/ACT inhaler Inhale 1 puff into the lungs 2 (two) times daily.      [name] warfarin (COUMADIN) 2 MG tablet Take 4-6 mg by mouth every evening. Takes 2 tablets daily, except for Mon, Wed, Fri- takes 6mg .        Allergies: Allergies as of 09/13/2014  . (No Known Allergies)   Past Medical History  Diagnosis Date  . Atrial fibrillation     Detected on an event recorder  . Coronary artery disease     a. LHC 4/12: OM1 90-95%, mid AV circumflex occluded with right to left collaterals, RCA 20-30% => b. PCI: BMS to CFX;  c. Echo 12/11: EF 40-45%, inferior HK. ;  d.  [name] Myoview 4/14:  EF 43%, large inferior and lateral scar, no ischemia, unchanged from 11/15/10; low risk  . Transient ischemic attack   . Ischemic cardiomyopathy     a. EF 40-45% by echo 2012 with inf  WMA;  b. Echo 3/14: Mild LVH, EF 55-60%.  . Emphysema   . PVC (premature ventricular contraction)   . Hypertension   . ACE inhibitor intolerance     Cough  . Unspecified diastolic heart failure   . CKD (chronic kidney disease)   . HLD (hyperlipidemia)    Past Surgical History  Procedure Laterality Date  . Cholecystectomy     Family History  Problem Relation Age of Onset  . Diabetes Mother   . Heart disease Neg Hx   . Heart attack Neg Hx    History   Social History  . Marital Status: Single    Spouse Name: N/A    Number of Children: N/A  . Years of Education: N/A   Occupational History  . retired    Social History Main Topics  . Smoking status: Former Smoker -- 1.00 packs/day for 50 years    Quit date: 02/12/2004  . Smokeless tobacco: Never Used  . Alcohol Use: Yes     Comment: several alcoholic beverages a week  . Drug Use: No  . Sexual Activity:    Other Topics Concern  . Not on file   Social History Narrative   Pt lives in [place] by himself.  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  He has 1 son who lives close by.  He has a greater the 50 pack-year smoking history but quit approximately 7 years ago.  He has several alcoholic beverages a week.  He denies any  Illicit drug use.     Review of Systems: General: Denies night sweats, changes in weight HEENT: Denies ear pain, changes in vision, rhinorrhea, sore throat CV: Denies palpitations, orthopnea Pulm: See HPI GI: Denies diarrhea, constipation, melena, hematochezia GU: Denies dysuria, hematuria, frequency Msk: Denies joint pains Neuro: Denies numbness, tingling Skin: Denies rashes, bruising  Physical Exam: Blood pressure 144/55, pulse 84, temperature 97.9 F (36.6 C), temperature source Oral, resp. rate 17, SpO2 98.00%. General: alert, pleasant, sitting up in bed, breathing comfortably HEENT: [place]/AT, EOMI, PERRL, mucus membranes dry CV: irregularly irregular, no  m/g/r Pulm: diffuse rhonchi and wheezing bilaterally, breathing comfortably on 2L oxygen via Ben[place] Abd: BS+, soft, non-tender, non-distended Ext: warm, no edema, moves all Neuro: alert and oriented x 3, CNs II-XII intact, strength intact  Lab results: Basic Metabolic Panel:  Recent Labs  09/13/14 0938  NA 142  K 4.5  CL 102  CO2 30  GLUCOSE 111*  BUN 29*  CREATININE 2.18*  CALCIUM 8.7   Liver Function Tests:  Recent Labs  09/13/14 0938  AST 184*  ALT 44  ALKPHOS 53  BILITOT 0.3  PROT 6.4  ALBUMIN 3.1*   No results found for this basename: LIPASE, AMYLASE,  in the last 72 hours No results found for this basename: AMMONIA,  in the last 72 hours CBC:  Recent Labs  09/13/14 0938  WBC 7.9  HGB 12.3*  HCT 39.0  MCV 91.5  PLT 176   Cardiac Enzymes:  Recent Labs  09/13/14 0938  CKTOTAL 16109*  TROPONINI <0.30   BNP: No results found for this basename: PROBNP,  in the last 72 hours D-Dimer: No results found for this basename: DDIMER,  in the last 72 hours CBG: No results found for this basename: GLUCAP,  in the last 72 hours Hemoglobin A1C: No results found for this basename: HGBA1C,  in the last 72 hours Fasting Lipid Panel: No results found for this basename: CHOL, HDL, LDLCALC, TRIG, CHOLHDL, LDLDIRECT,  in the last 72 hours Thyroid Function Tests: No results found for this basename: TSH, T4TOTAL, FREET4, T3FREE, THYROIDAB,  in the last 72 hours Anemia Panel: No results found for this basename: VITAMINB12, FOLATE, FERRITIN, TIBC, IRON, RETICCTPCT,  in the last 72 hours Coagulation:  Recent Labs  09/13/14 0938  LABPROT 25.8*  INR 2.34*   Urine Drug Screen: Drugs of Abuse  No results found for this basename: labopia, cocainscrnur, labbenz, amphetmu, thcu, labbarb    Alcohol Level: No results found for this basename: ETH,  in the last 72 hours Urinalysis:  Recent Labs  09/13/14 1134  COLORURINE AMBER*  LABSPEC 1.019  PHURINE 7.5  GLUCOSEU  NEGATIVE  HGBUR LARGE*  BILIRUBINUR NEGATIVE  KETONESUR NEGATIVE  PROTEINUR >300*  UROBILINOGEN 0.2  NITRITE NEGATIVE  LEUKOCYTESUR TRACE*    Imaging results:  Dg Chest 2 View  09/13/2014   CLINICAL DATA:  Shortness of breath for 1 year. Status post fall last night.  EXAM: CHEST  2 VIEW  COMPARISON:  Single view of the chest 03/08/2014 and 02/05/2013.  FINDINGS: The chest is hyperexpanded but the lungs are clear. Peribronchial thickening is noted. No pneumothorax or pleural effusion. Heart size is upper normal.  IMPRESSION: Findings compatible with COPD.  No acute abnormality.   Electronically Signed   By: Drusilla Kanner M.D.   On: 09/13/2014 12:47   Dg Hip Complete Left  09/13/2014   CLINICAL DATA:  Status post fall from bed. Left hip pain. Initial encounter.  EXAM: LEFT HIP - COMPLETE 2+ VIEW  COMPARISON:  None.  FINDINGS: No acute bony or joint abnormality is identified. Mild degenerative change is present about the hips. No focal bony lesion.  IMPRESSION: No acute abnormality.   Electronically Signed   By: Drusilla Kanner M.D.   On: 09/13/2014 14:07   Ct Head Wo Contrast  09/13/2014   CLINICAL DATA:  Patient found on floor.  EXAM: CT HEAD WITHOUT CONTRAST  TECHNIQUE: Contiguous axial images were obtained from the base of the skull through the vertex without contrast.  COMPARISON:  11/04/2010  FINDINGS: No evidence for acute hemorrhage, mass lesion, midline shift, hydrocephalus or large infarct. There is low density in the periventricular white matter suggesting chronic changes. Mild cerebral atrophy is unchanged. Mucosal thickening in the right maxillary sinus is likely chronic. Polyp  or retention cyst in the anterior left sphenoid sinus. Mucosal thickening in the ethmoid air cells. No acute bone abnormality.  IMPRESSION: No acute intracranial abnormality.  Low density in the periventricular white matter suggests chronic small vessel ischemic changes.  Paranasal sinus disease.    Electronically Signed   By: Richarda Overlie M.D.   On: 09/13/2014 11:05    Other results: EKG: Sinus tachycardia, PVCs  Assessment & Plan: Keith Larson is a 80yo man w/ PMHx of AFib on coumadin, CAD s/p cardiac cath in 2012, TIA, ischemic CMO (last echo in 01/2013 showed EF 55-60% with no wall motion abnormalities, last Myoview in 2014 was low risk), COPD, HTN, CKD Stage IV, and HLD who presented to the ED after having a fall last night, found to have CK 12000 consistent with rhabdomyolysis.  1. Rhabdomyolysis: Patient presented to ED after falling out of bed and was unable to get up. He remained on the floor for several hours until his family found him. He was found to have a CK of 16109 and large amount of Hbg in his urine. These are consistent with rhabdomyolysis. Patient received a 1.5 L bolus in the ED. Will continue hydration.  - IVF @ 100 ml/hr - Repeat CK in AM - Repeat bmet - Trend troponins  - Cardiac monitoring  2. COPD Exacerbation: Patient presented with 2 week history of productive cough. He reports green mucus. He states that he is compliant with his inhalers at home. He takes Proventil BID, Spiriva daily, Symbicort BID, and Prednisone 40 mg daily at home. He is also on home oxygen, 2L.  - Continue Symbicort - Start Duonebs Q4H - Continue Prednisone 40 mg daily - Continue oxygen therapy - Pulse oximetry  3. Atrial Fibrillation: Patient takes coumadin at home. INR therapeutic at 2.34.  - Continue coumadin  4. Hx of Ischemic Cardiomyopathy: Patient had cardiac cath in 2012 found to have occlusive disease. His last echo was in 01/2013 which showed EF 55-60%, no wall motion abnormalities. He also had a Myoview in 01/2013 which showed low risk. He takes Metoprolol 50 mg BID and Lasix 20 mg daily PRN edema. - Continue home meds  5. HTN: BP 131/71 on admission. Stable in 120-130s/70s. Patient takes Metoprolol 50 mg BID and Lasix 20 mg daily PRN edema at home. - Continue home  Metoprolol  6. CKD Stage IV: GFR 31 on admission, Cr 2.18, near baseline. - Continue to monitor  7. HLD: Last lipid profile on 10/2010 shows Chol 132, Trigly 42, HDL 35, LDL 89. Patient takes Pravastatin 20 mg daily at home. - Continue Pravastatin  Diet: Heart healthy DVT/PE PPx: Coumadin Dispo: Disposition is deferred at this time, awaiting improvement of current medical problems. Anticipated discharge in approximately 1-2 day(s).   The patient does not have a current PCP (Pcp Not In System) and does need an [facility] hospital follow-up appointment after discharge.  The patient does not have transportation limitations that hinder transportation to clinic appointments.  Signed: Rich Number, MD 09/13/2014, 6:29 PM

## 2014-09-13 NOTE — ED Notes (Signed)
Per pt sts he rolled out of the bed last night to the floor and layed there all night. Denies syncope and hitting head. sts legs hurt.

## 2014-09-13 NOTE — Progress Notes (Addendum)
ANTICOAGULATION CONSULT NOTE - Initial Consult  Pharmacy Consult for Warfarin Indication: atrial fibrillation  No Known Allergies  Patient Measurements: Height: 5\' 8"  (172.7 cm) Weight: 176 lb 12.8 oz (80.196 kg) IBW/kg (Calculated) : 68.4  Vital Signs: Temp: 99 F (37.2 C) (10/17 1921) Temp Source: Oral (10/17 1921) BP: 136/67 mmHg (10/17 1921) Pulse Rate: 105 (10/17 1921)  Labs:  Recent Labs  09/13/14 0938 09/13/14 1845  HGB 12.3*  --   HCT 39.0  --   PLT 176  --   LABPROT 25.8*  --   INR 2.34*  --   CREATININE 2.18* 1.98*  CKTOTAL 00370*  --   TROPONINI <0.30 <0.30    Estimated Creatinine Clearance: 28.8 ml/min (by C-G formula based on Cr of 1.98).   Medical History: Past Medical History  Diagnosis Date  . Atrial fibrillation     Detected on an event recorder  . Coronary artery disease     a. LHC 4/12: OM1 90-95%, mid AV circumflex occluded with right to left collaterals, RCA 20-30% => b. PCI: BMS to CFX;  c. Echo 12/11: EF 40-45%, inferior HK. ;  d.  [name] Myoview 4/14:  EF 43%, large inferior and lateral scar, no ischemia, unchanged from 11/15/10; low risk  . Transient ischemic attack   . Ischemic cardiomyopathy     a. EF 40-45% by echo 2012 with inf WMA;  b. Echo 3/14: Mild LVH, EF 55-60%.  . Emphysema   . PVC (premature ventricular contraction)   . Hypertension   . ACE inhibitor intolerance     Cough  . Unspecified diastolic heart failure   . CKD (chronic kidney disease)   . HLD (hyperlipidemia)     Assessment: 21 YOM who [facility] the [facility] s/p fall. At the time of presentation, the patient was noted to have rhabdomylosis with an elevated CK of 488[address]. Pharmacy was consulted to resume warfarin for anticoagulation in the setting of hx Afib. INR was therapeutic on admission (2.34) on PTA dose of 4 mg daily EXCEPT for 6 mg on Mon/Wed (called and discussed with patient). Will give slightly reduced dose this evening since the patient is noted  to have rhabdo and elevated AST which are known to increase warfarin sensitivity.   Goal of Therapy:  INR 2-3   Plan:  1. Warfarin 2 mg x 1 dose at 1800 today 2. Daily PT/INR 3. Will continue to monitor for any signs/symptoms of bleeding and will follow up with PT/INR in the a.m.   Georgina Pillion, PharmD, BCPS Clinical Pharmacist Pager: [phone] 09/13/2014 9:08 PM

## 2014-09-13 NOTE — ED Notes (Signed)
Patient transported to CT 

## 2014-09-14 DIAGNOSIS — Z79899 Other long term (current) drug therapy: Secondary | ICD-10-CM | POA: Diagnosis not present

## 2014-09-14 DIAGNOSIS — I10 Essential (primary) hypertension: Secondary | ICD-10-CM | POA: Diagnosis not present

## 2014-09-14 DIAGNOSIS — N184 Chronic kidney disease, stage 4 (severe): Secondary | ICD-10-CM | POA: Diagnosis present

## 2014-09-14 DIAGNOSIS — J441 Chronic obstructive pulmonary disease with (acute) exacerbation: Secondary | ICD-10-CM | POA: Diagnosis present

## 2014-09-14 DIAGNOSIS — Z7901 Long term (current) use of anticoagulants: Secondary | ICD-10-CM | POA: Diagnosis not present

## 2014-09-14 DIAGNOSIS — I5032 Chronic diastolic (congestive) heart failure: Secondary | ICD-10-CM | POA: Diagnosis present

## 2014-09-14 DIAGNOSIS — Z9049 Acquired absence of other specified parts of digestive tract: Secondary | ICD-10-CM | POA: Diagnosis present

## 2014-09-14 DIAGNOSIS — Z9981 Dependence on supplemental oxygen: Secondary | ICD-10-CM | POA: Diagnosis not present

## 2014-09-14 DIAGNOSIS — I129 Hypertensive chronic kidney disease with stage 1 through stage 4 chronic kidney disease, or unspecified chronic kidney disease: Secondary | ICD-10-CM | POA: Diagnosis present

## 2014-09-14 DIAGNOSIS — W06XXXA Fall from bed, initial encounter: Secondary | ICD-10-CM | POA: Diagnosis present

## 2014-09-14 DIAGNOSIS — Z8673 Personal history of transient ischemic attack (TIA), and cerebral infarction without residual deficits: Secondary | ICD-10-CM | POA: Diagnosis not present

## 2014-09-14 DIAGNOSIS — I493 Ventricular premature depolarization: Secondary | ICD-10-CM | POA: Diagnosis present

## 2014-09-14 DIAGNOSIS — E785 Hyperlipidemia, unspecified: Secondary | ICD-10-CM | POA: Diagnosis present

## 2014-09-14 DIAGNOSIS — M6282 Rhabdomyolysis: Secondary | ICD-10-CM | POA: Diagnosis present

## 2014-09-14 DIAGNOSIS — I255 Ischemic cardiomyopathy: Secondary | ICD-10-CM | POA: Diagnosis present

## 2014-09-14 DIAGNOSIS — Z87891 Personal history of nicotine dependence: Secondary | ICD-10-CM | POA: Diagnosis not present

## 2014-09-14 DIAGNOSIS — M25552 Pain in left hip: Secondary | ICD-10-CM | POA: Diagnosis present

## 2014-09-14 DIAGNOSIS — I2510 Atherosclerotic heart disease of native coronary artery without angina pectoris: Secondary | ICD-10-CM | POA: Diagnosis present

## 2014-09-14 DIAGNOSIS — I4891 Unspecified atrial fibrillation: Secondary | ICD-10-CM | POA: Diagnosis present

## 2014-09-14 DIAGNOSIS — Z9861 Coronary angioplasty status: Secondary | ICD-10-CM | POA: Diagnosis not present

## 2014-09-14 DIAGNOSIS — Z7952 Long term (current) use of systemic steroids: Secondary | ICD-10-CM | POA: Diagnosis not present

## 2014-09-14 DIAGNOSIS — Z9109 Other allergy status, other than to drugs and biological substances: Secondary | ICD-10-CM | POA: Diagnosis not present

## 2014-09-14 LAB — COMPREHENSIVE METABOLIC PANEL
ALK PHOS: 50 U/L (ref 39–117)
ALT: 55 U/L — AB (ref 0–53)
AST: 216 U/L — ABNORMAL HIGH (ref 0–37)
Albumin: 2.7 g/dL — ABNORMAL LOW (ref 3.5–5.2)
Anion gap: 7 (ref 5–15)
BUN: 30 mg/dL — ABNORMAL HIGH (ref 6–23)
CO2: 29 mEq/L (ref 19–32)
Calcium: 8.2 mg/dL — ABNORMAL LOW (ref 8.4–10.5)
Chloride: 104 mEq/L (ref 96–112)
Creatinine, Ser: 2.07 mg/dL — ABNORMAL HIGH (ref 0.50–1.35)
GFR calc Af Amer: 33 mL/min — ABNORMAL LOW (ref >90)
GFR calc non Af Amer: 29 mL/min — ABNORMAL LOW (ref >90)
Glucose, Bld: 144 mg/dL — ABNORMAL HIGH (ref 70–99)
POTASSIUM: 4.1 meq/L (ref 3.7–5.3)
SODIUM: 140 meq/L (ref 137–147)
TOTAL PROTEIN: 5.9 g/dL — AB (ref 6.0–8.3)
Total Bilirubin: 0.3 mg/dL (ref 0.3–1.2)

## 2014-09-14 LAB — CK
CK TOTAL: 10033 U/L — AB (ref 7–232)
Total CK: 7788 U/L — ABNORMAL HIGH (ref 7–232)

## 2014-09-14 LAB — CBC
HCT: 36.4 % — ABNORMAL LOW (ref 39.0–52.0)
HEMOGLOBIN: 11.4 g/dL — AB (ref 13.0–17.0)
MCH: 29.4 pg (ref 26.0–34.0)
MCHC: 31.3 g/dL (ref 30.0–36.0)
MCV: 93.8 fL (ref 78.0–100.0)
Platelets: 138 10*3/uL — ABNORMAL LOW (ref 150–400)
RBC: 3.88 MIL/uL — AB (ref 4.22–5.81)
RDW: 14.4 % (ref 11.5–15.5)
WBC: 6.9 10*3/uL (ref 4.0–10.5)

## 2014-09-14 LAB — TROPONIN I: Troponin I: <0.3 ng/mL (ref <0.30)

## 2014-09-14 LAB — PROTIME-INR
INR: 2.37 — AB (ref 0.00–1.49)
Prothrombin Time: 26.1 seconds — ABNORMAL HIGH (ref 11.6–15.2)

## 2014-09-14 MED ORDER — WARFARIN SODIUM 2 MG PO TABS
2.0000 mg | ORAL_TABLET | Freq: Once | ORAL | Status: AC
  Administered 2014-09-14: 2 mg via ORAL
  Filled 2014-09-14: qty 1

## 2014-09-14 NOTE — Progress Notes (Signed)
Utilization Review Completed.   Nyasha Rahilly, RN, BSN Nurse Case Manager  

## 2014-09-14 NOTE — Progress Notes (Signed)
ANTICOAGULATION CONSULT NOTE - Follow Up Consult  Pharmacy Consult for coumadin Indication: atrial fibrillation  No Known Allergies  Patient Measurements: Height: 5\' 8"  (172.7 cm) Weight: 176 lb 12.8 oz (80.196 kg) IBW/kg (Calculated) : 68.4 Heparin Dosing Weight:   Vital Signs: Temp: 98.9 F (37.2 C) (10/18 1009) Temp Source: Oral (10/18 1009) BP: 154/71 mmHg (10/18 1009) Pulse Rate: 89 (10/18 1009)  Labs:  Recent Labs  09/13/14 0938 09/13/14 1845 09/14/14 0007 09/14/14 0645  HGB 12.3*  --  11.4*  --   HCT 39.0  --  36.4*  --   PLT 176  --  138*  --   LABPROT 25.8*  --  26.1*  --   INR 2.34*  --  2.37*  --   CREATININE 2.18* 1.98* 2.07*  --   CKTOTAL 12083*  --  10033* 7788*  TROPONINI <0.30 <0.30 <0.30 <0.30    Estimated Creatinine Clearance: 27.5 ml/min (by C-G formula based on Cr of 2.07).   Medications:  Scheduled:  . budesonide-formoterol  1 puff Inhalation BID  . ipratropium-albuterol  3 mL Nebulization TID  . metoprolol  25 mg Oral BID  . pravastatin  20 mg Oral Daily  . predniSONE  40 mg Oral Q breakfast  . sodium chloride  3 mL Intravenous Q12H  . warfarin  2 mg Oral ONCE-1800  . Warfarin - Pharmacist Dosing Inpatient   Does not apply q1800    Assessment: Pt INR was 2.34 yesterday, and 2.37 today after a 2 mg dose. Will repeat the 2 mg dose again today.  Goal of Therapy:  INR 2-3 Monitor platelets by anticoagulation protocol: Yes   Plan:  Coumadin 2 mg today. F/U INR in the morning.  Eugene Garnet 09/14/2014,4:14 PM

## 2014-09-14 NOTE — Progress Notes (Signed)
Subjective: Patient reports feeling tired and muscle cramps in his legs this morning. His CK is steadily trending down from 12000 --> 10,000--> 7800. Will continue to hydrate patient.   Objective: Vital signs in last 24 hours: Filed Vitals:   09/13/14 1921 09/14/14 0417 09/14/14 1009 09/14/14 1056  BP: 136/67 126/63 154/71   Pulse: 105 87 89   Temp: 99 F (37.2 C) 98.8 F (37.1 C) 98.9 F (37.2 C)   TempSrc: Oral Oral Oral   Resp: 16 17 18    Height: 5\' 8"  (1.727 m)     Weight: 176 lb 12.8 oz (80.196 kg)     SpO2: 96% 96% 94% 92%   Weight change:   Intake/Output Summary (Last 24 hours) at 09/14/14 1149 Last data filed at 09/14/14 1009  Gross per 24 hour  Intake   1500 ml  Output    650 ml  Net    850 ml   Physical Exam General: alert, pleasant, sitting up in bed HEENT: [place]/AT, EOMI, mucus membranes moist CV: RRR, normal S1/S2, no m/g/r Pulm: diffuse rhonchi and wheezing bilaterally, about same as yesterday, breathing comfortably on 2L oxygen via [place] Abd: BS+, soft, non-tender, non-distended Ext: warm, no edema, moves all Neuro: alert and oriented x 3, CNs II-XII intact  Lab Results: Basic Metabolic Panel:  Recent Labs Lab 09/13/14 1845 09/14/14 0007  NA 140 140  K 4.4 4.1  CL 104 104  CO2 25 29  GLUCOSE 86 144*  BUN 29* 30*  CREATININE 1.98* 2.07*  CALCIUM 8.4 8.2*   Liver Function Tests:  Recent Labs Lab 09/13/14 0938 09/14/14 0007  AST 184* 216*  ALT 44 55*  ALKPHOS 53 50  BILITOT 0.3 0.3  PROT 6.4 5.9*  ALBUMIN 3.1* 2.7*   No results found for this basename: LIPASE, AMYLASE,  in the last 168 hours No results found for this basename: AMMONIA,  in the last 168 hours CBC:  Recent Labs Lab 09/13/14 0938 09/14/14 0007  WBC 7.9 6.9  HGB 12.3* 11.4*  HCT 39.0 36.4*  MCV 91.5 93.8  PLT 176 138*   Cardiac Enzymes:  Recent Labs Lab 09/13/14 0938 09/13/14 1845 09/14/14 0007 09/14/14 0645  CKTOTAL 16109*  --  10033* 7788*  TROPONINI  <0.30 <0.30 <0.30 <0.30   BNP: No results found for this basename: PROBNP,  in the last 168 hours D-Dimer: No results found for this basename: DDIMER,  in the last 168 hours CBG: No results found for this basename: GLUCAP,  in the last 168 hours Hemoglobin A1C: No results found for this basename: HGBA1C,  in the last 168 hours Fasting Lipid Panel: No results found for this basename: CHOL, HDL, LDLCALC, TRIG, CHOLHDL, LDLDIRECT,  in the last 168 hours Thyroid Function Tests: No results found for this basename: TSH, T4TOTAL, FREET4, T3FREE, THYROIDAB,  in the last 168 hours Coagulation:  Recent Labs Lab 09/13/14 0938 09/14/14 0007  LABPROT 25.8* 26.1*  INR 2.34* 2.37*   Anemia Panel: No results found for this basename: VITAMINB12, FOLATE, FERRITIN, TIBC, IRON, RETICCTPCT,  in the last 168 hours Urine Drug Screen: Drugs of Abuse  No results found for this basename: labopia, cocainscrnur, labbenz, amphetmu, thcu, labbarb    Alcohol Level: No results found for this basename: ETH,  in the last 168 hours Urinalysis:  Recent Labs Lab 09/13/14 1134  COLORURINE AMBER*  LABSPEC 1.019  PHURINE 7.5  GLUCOSEU NEGATIVE  HGBUR LARGE*  BILIRUBINUR NEGATIVE  KETONESUR NEGATIVE  PROTEINUR >300*  UROBILINOGEN 0.2  NITRITE NEGATIVE  LEUKOCYTESUR TRACE*    Micro Results: No results found for this or any previous visit (from the past 240 hour(s)). Studies/Results: Dg Chest 2 View  09/13/2014   CLINICAL DATA:  Shortness of breath for 1 year. Status post fall last night.  EXAM: CHEST  2 VIEW  COMPARISON:  Single view of the chest 03/08/2014 and 02/05/2013.  FINDINGS: The chest is hyperexpanded but the lungs are clear. Peribronchial thickening is noted. No pneumothorax or pleural effusion. Heart size is upper normal.  IMPRESSION: Findings compatible with COPD.  No acute abnormality.   Electronically Signed   By: Drusilla Kanner M.D.   On: 09/13/2014 12:47   Dg Hip Complete  Left  09/13/2014   CLINICAL DATA:  Status post fall from bed. Left hip pain. Initial encounter.  EXAM: LEFT HIP - COMPLETE 2+ VIEW  COMPARISON:  None.  FINDINGS: No acute bony or joint abnormality is identified. Mild degenerative change is present about the hips. No focal bony lesion.  IMPRESSION: No acute abnormality.   Electronically Signed   By: Drusilla Kanner M.D.   On: 09/13/2014 14:07   Ct Head Wo Contrast  09/13/2014   CLINICAL DATA:  Patient found on floor.  EXAM: CT HEAD WITHOUT CONTRAST  TECHNIQUE: Contiguous axial images were obtained from the base of the skull through the vertex without contrast.  COMPARISON:  11/04/2010  FINDINGS: No evidence for acute hemorrhage, mass lesion, midline shift, hydrocephalus or large infarct. There is low density in the periventricular white matter suggesting chronic changes. Mild cerebral atrophy is unchanged. Mucosal thickening in the right maxillary sinus is likely chronic. Polyp or retention cyst in the anterior left sphenoid sinus. Mucosal thickening in the ethmoid air cells. No acute bone abnormality.  IMPRESSION: No acute intracranial abnormality.  Low density in the periventricular white matter suggests chronic small vessel ischemic changes.  Paranasal sinus disease.   Electronically Signed   By: Richarda Overlie M.D.   On: 09/13/2014 11:05   Medications: I have reviewed the patient's current medications. Scheduled Meds: . budesonide-formoterol  1 puff Inhalation BID  . ipratropium-albuterol  3 mL Nebulization TID  . metoprolol  25 mg Oral BID  . pravastatin  20 mg Oral Daily  . predniSONE  40 mg Oral Q breakfast  . sodium chloride  3 mL Intravenous Q12H  . Warfarin - Pharmacist Dosing Inpatient   Does not apply q1800   Continuous Infusions: . sodium chloride 150 mL/hr at 09/14/14 1133   PRN Meds:.acetaminophen, acetaminophen, guaiFENesin, ipratropium-albuterol Assessment/Plan: Mr. Cloonan is a 80yo man w/ PMHx of AFib on coumadin, CAD s/p cardiac  cath in 2012, TIA, ischemic CMO (last echo in 01/2013 showed EF 55-60% with no wall motion abnormalities, last Myoview in 2014 was low risk), COPD, HTN, CKD Stage IV, and HLD who presented to the ED after having a fall last night, found to have CK 12000 consistent with rhabdomyolysis.   1. Rhabdomyolysis: Patient presented with CK 12000. He was started on IVF @ 100 ml/hr after 1.5 L bolus in the ED, given his history of heart failure. CK is trending down to 10000--> 7800. Troponins negative x 3. Cr stable at around 2.  - Increase IVF to 150 ml/hr  - Repeat CK in AM - Repeat bmet - Cardiac monitoring   2. COPD Exacerbation: Patient presented with 2 week history of productive cough. He does not feel more SOB than his baseline. He is breathing comfortably on 2L  oxygen via [place]. He was placed on Prednisone, Symbicort, and Duonebs.  - Continue Symbicort  - Continue Duonebs Q4H  - Continue Prednisone 40 mg daily  - Continue oxygen therapy  - Pulse oximetry   3. Atrial Fibrillation: Patient takes coumadin at home. INR therapeutic at 2.34.  - Continue coumadin   4. Hx of Ischemic Cardiomyopathy: Patient had cardiac cath in 2012 found to have occlusive disease. His last echo was in 01/2013 which showed EF 55-60%, no wall motion abnormalities. He also had a Myoview in 01/2013 which showed low risk. He takes Metoprolol 50 mg BID and Lasix 20 mg daily PRN edema.  - Continue Metoprolol - Lasix not needed at this time  5. HTN: BP 131/71 on admission. Stable in 120-130s/70s. Patient takes Metoprolol 50 mg BID and Lasix 20 mg daily PRN edema at home.  - Continue home Metoprolol   6. CKD Stage IV: GFR 31 on admission, Cr 2.18, near baseline. Cr around 2 now.  - Continue to monitor   7. HLD: Last lipid profile on 10/2010 shows Chol 132, Trigly 42, HDL 35, LDL 89. Patient takes Pravastatin 20 mg daily at home.  - Continue Pravastatin   Diet: Heart healthy  DVT/PE PPx: Coumadin  Dispo: Disposition is  deferred at this time, awaiting improvement of current medical problems. Anticipated discharge in approximately 1-2 day(s).    The patient does have a current PCP (Pcp Not In System) and does need [facility] after discharge.  The patient does not have transportation limitations that hinder transportation to clinic appointments.  .Services Needed at time of discharge: Y = Yes, Blank = No PT:   OT:   RN:   Equipment:   Other:     LOS: 1 day   Rich Numberarly Rivet, MD 09/14/2014, 11:49 AM

## 2014-09-14 NOTE — Evaluation (Signed)
Physical Therapy Evaluation Patient Details Name: Keith Larson MRN: [id number] DOB: November 04, 1932 Today's Date: 09/14/2014   History of Present Illness   80yo man w/ PMHx of AFib on coumadin, CAD s/p cardiac cath in 2012, TIA, ischemic CMO (last echo in 01/2013 showed EF 55-60% with no wall motion abnormalities, last Myoview in 2014 was low risk), COPD, HTN, CKD Stage IV, and HLD who presented to the ED after having a fall. Patient reports he went to bed and when he woke up he was on the floor. He states he did not feel well last night. He does not remember how he got onto the floor and does not believe he hit his head. He states he has some pain on his left ribs and his left hip. He states he tried to get up after falling but was not able to move. He reports he had to wait until his family came over before he was able to get up off the floor. He denies dizziness, headache, slurred speech, chest pain, abdominal pain, nausea, vomiting. He reports good PO intake. Patient states he has been coughing a lot the past 2 weeks. He reports a productive cough with green sputum. He states he is on 2 L of oxygen at home. He reports SOB that is near his baseline, but has noticed a lot of wheezing. He denies fever, chills. In the ED, patient found to have a [place] of 38[address] and a large amount of Hbg in his urine. His Cr was 2.18, which is his baseline. He received a 1.5 L bolus. An x-ray of the left hip showed no acute fracture. CXR showed hyperexpanded lung fields which is consistent with COPD.   Clinical Impression  Pt presents with moderate functional limitations related to pain, stiffness, soreness and muscle weakness and will benefit from PT interventions to address problems (see note below).  Recommend SNF for postacute therapy needs, however pt prefers to go home, states neighbor can provide 24/7 assist.  If pt has solid plan for home assist, would likely be able to progress with HHPT, although pt informed that progress  would be slower in [facility] setting.  Will initiate PT in acute setting and update d/c recommendations as condition improves.  Encourage OOB with nursing, walking short distance with assist in addition to formal PT.    Follow Up Recommendations SNF (If 24/7 assist at d/c, can go home with HHPT)    Equipment Recommendations  Rolling walker with 5" wheels (may not need if recovery of gait stability)    Recommendations for Other Services       Precautions / Restrictions Precautions Precautions: Fall Precaution Comments: weak, SOB with activity; Restrictions Weight Bearing Restrictions: No      Mobility  Bed Mobility Overal bed mobility: Needs Assistance Bed Mobility: Rolling;Supine to Sit Rolling: Min assist Sidelying to sit: Min assist Supine to sit: Min guard     General bed mobility comments: stiff and sore and weak; assist to get started then patient able to perform/maintain positions/changes  Transfers Overall transfer level: Needs assistance Equipment used: 1 person hand held assist Transfers: Sit to/from Stand Sit to Stand: Min assist         General transfer comment: physical assist to initiate lift off and to stabilize in standing  Ambulation/Gait Ambulation/Gait assistance: Min assist Ambulation Distance (Feet): 150 Feet Assistive device: 1 person hand held assist Gait Pattern/deviations: Step-through pattern;Decreased stride length;Narrow base of support;Antalgic;Trunk flexed Gait velocity: unmeasured, slow Gait velocity interpretation: Below normal  speed for age/gender General Gait Details: hesitant, truncated stride, slighty forward posture; reluctant to go too far but minimal DOE noted/reported; cues to incr stride and speed but pt unable "it hurts more"  Stairs            Wheelchair Mobility    Modified Rankin (Stroke Patients Only)       Balance Overall balance assessment: Needs assistance Sitting-balance support: No upper extremity  supported;Feet supported Sitting balance-Leahy Scale: Good     Standing balance support: Single extremity supported;During functional activity Standing balance-Leahy Scale: Poor Standing balance comment: needs assist to steady in standing, and min assist guard for stabilty walking                             Pertinent Vitals/Pain Pain Assessment: No/denies pain    Home Living Family/patient expects to be discharged to:: Private residence Living Arrangements: Alone Available Help at Discharge: Friend(s);Available 24 hours/day Type of Home: House Home Access: Stairs to enter Entrance Stairs-Rails: Left Entrance Stairs-Number of Steps: 4 Home Layout: One level Home Equipment: None Additional Comments: Neighbor can stay with patient for home d/c if needed    Prior Function Level of Independence: Independent         Comments: drives, cooks, tends to yard; has cleaning person every 2 weeks     Hand Dominance        Extremity/Trunk Assessment   Upper Extremity Assessment: Generalized weakness;Defer to OT evaluation           Lower Extremity Assessment: Generalized weakness      Cervical / Trunk Assessment: Normal  Communication   Communication: HOH  Cognition Arousal/Alertness: Awake/alert Behavior During Therapy: WFL for tasks assessed/performed Overall Cognitive Status: Within Functional Limits for tasks assessed                      General Comments      Exercises        Assessment/Plan    PT Assessment Patient needs continued PT services  PT Diagnosis Difficulty walking;Acute pain   PT Problem List Pain;Cardiopulmonary status limiting activity;Decreased knowledge of use of DME;Decreased mobility;Decreased balance  PT Treatment Interventions DME instruction;Gait training;Stair training;Functional mobility training;Therapeutic activities;Therapeutic exercise;Patient/family education   PT Goals (Current goals can be found in the  Care Plan section) Acute Rehab PT Goals Patient Stated Goal: get back home PT Goal Formulation: With patient Time For Goal Achievement: 09/28/14 Potential to Achieve Goals: Good    Frequency Min 3X/week   Barriers to discharge Decreased caregiver support unless neighbor can commit to 24/7 supervision for short term, pt is unsafe to d/c home alone    Co-evaluation               End of Session Equipment Utilized During Treatment: Gait belt;Oxygen Activity Tolerance: Patient limited by pain Patient left: in chair;with family/visitor present;with call bell/phone within reach Nurse Communication: Mobility status         Time: 1310-1340 PT Time Calculation (min): 30 min   Charges:   PT Evaluation $Initial PT Evaluation Tier I: 1 Procedure PT Treatments $Gait Training: 8-22 mins $Therapeutic Activity: 8-22 mins   PT G Codes:          Dennis BastMartin, Carson Meche Galloway 09/14/2014, 3:38 PM

## 2014-09-14 NOTE — Progress Notes (Signed)
Please see my H&P from earlier for my assessment and plan.

## 2014-09-14 NOTE — H&P (Signed)
Internal Medicine Attending Admission Note Date: 09/14/2014  [name] Medical record number: [id number] Date of birth: Apr 07, 1932 Age: 80 y.o. Gender: male  I saw and evaluated the patient. I reviewed the resident's note and I agree with the resident's findings and plan as documented in the resident's note.  Chief Complaint(s): Fall with muscle aches  History - key components related to admission:  Keith Larson is an 80 year old man with a history of chronic obstructive pulmonary disease, stage IV chronic kidney disease, atrial fibrillation, hypertension, and hyperlipidemia who presented to the emergency department after falling out of bed and spending the night on the floor. He is unsure how he ended up on the floor next to his bed but was unable to get up until his family helped him in the morning. He had pain throughout his body but denied any headaches, dizziness, numbness, chest pain, worsening shortness of breath from baseline, slurred speech, fevers, shakes, chills, nausea, or vomiting. He presented to the emergency department where he was found to have a large amount of hemoglobin in his urine on dipstick and a total CK of just over 12,000. He was therefore admitted to the internal medicine teaching service for further evaluation and care.  Physical Exam - key components related to admission:  Filed Vitals:   09/13/14 1921 09/14/14 0417 09/14/14 1009 09/14/14 1056  BP: 136/67 126/63 154/71   Pulse: 105 87 89   Temp: 99 F (37.2 C) 98.8 F (37.1 C) 98.9 F (37.2 C)   TempSrc: Oral Oral Oral   Resp: 16 17 18    Height: 5\' 8"  (1.727 m)     Weight: 176 lb 12.8 oz (80.196 kg)     SpO2: 96% 96% 94% 92%   General: Well-developed, well-nourished, man lying comfortably in bed in no acute distress. Lungs: Prolonged expiratory phase. Extremities: Muscle tenderness to palpation and pain with movement of the extremities.  Lab results:  Basic Metabolic Panel:  Recent Labs  19/41/74 1845 09/14/14 0007  NA 140 140  K 4.4 4.1  CL 104 104  CO2 25 29  GLUCOSE 86 144*  BUN 29* 30*  CREATININE 1.98* 2.07*  CALCIUM 8.4 8.2*   Liver Function Tests:  Recent Labs  09/13/14 0938 09/14/14 0007  AST 184* 216*  ALT 44 55*  ALKPHOS 53 50  BILITOT 0.3 0.3  PROT 6.4 5.9*  ALBUMIN 3.1* 2.7*   CBC:  Recent Labs  09/13/14 0938 09/14/14 0007  WBC 7.9 6.9  HGB 12.3* 11.4*  HCT 39.0 36.4*  MCV 91.5 93.8  PLT 176 138*   Cardiac Enzymes:  Recent Labs  09/13/14 0938 09/13/14 1845 09/14/14 0007 09/14/14 0645  CKTOTAL 08144*  --  10033* 7788*  TROPONINI <0.30 <0.30 <0.30 <0.30   Coagulation:  Recent Labs  09/13/14 0938 09/14/14 0007  INR 2.34* 2.37*   Urinalysis:  Amber, clear, specific gravity 1.019, pH 7.5, hemoglobin large, protein greater than 300, nitrite negative, trace leukocytes, 3-6 white blood cells per high-power field, 7-10 red blood cells per high-power field, granular casts  Imaging results:  Dg Chest 2 View  09/13/2014   CLINICAL DATA:  Shortness of breath for 1 year. Status post fall last night.  EXAM: CHEST  2 VIEW  COMPARISON:  Single view of the chest 03/08/2014 and 02/05/2013.  FINDINGS: The chest is hyperexpanded but the lungs are clear. Peribronchial thickening is noted. No pneumothorax or pleural effusion. Heart size is upper normal.  IMPRESSION: Findings compatible with COPD.  No acute abnormality.   Electronically Signed   By: Drusilla Kannerhomas  Dalessio M.D.   On: 09/13/2014 12:47   Dg Hip Complete Left  09/13/2014   CLINICAL DATA:  Status post fall from bed. Left hip pain. Initial encounter.  EXAM: LEFT HIP - COMPLETE 2+ VIEW  COMPARISON:  None.  FINDINGS: No acute bony or joint abnormality is identified. Mild degenerative change is present about the hips. No focal bony lesion.  IMPRESSION: No acute abnormality.   Electronically Signed   By: Drusilla Kannerhomas  Dalessio M.D.   On: 09/13/2014 14:07   Ct Head Wo Contrast  09/13/2014    CLINICAL DATA:  Patient found on floor.  EXAM: CT HEAD WITHOUT CONTRAST  TECHNIQUE: Contiguous axial images were obtained from the base of the skull through the vertex without contrast.  COMPARISON:  11/04/2010  FINDINGS: No evidence for acute hemorrhage, mass lesion, midline shift, hydrocephalus or large infarct. There is low density in the periventricular white matter suggesting chronic changes. Mild cerebral atrophy is unchanged. Mucosal thickening in the right maxillary sinus is likely chronic. Polyp or retention cyst in the anterior left sphenoid sinus. Mucosal thickening in the ethmoid air cells. No acute bone abnormality.  IMPRESSION: No acute intracranial abnormality.  Low density in the periventricular white matter suggests chronic small vessel ischemic changes.  Paranasal sinus disease.   Electronically Signed   By: Richarda OverlieAdam  Henn M.D.   On: 09/13/2014 11:05   Other results:  EKG: Atrial fibrillation at 115 beats per minute, one premature ventricular contraction, right axis deviation, normal intervals, no significant Q waves, no LVH by voltage, good R wave progression, nonspecific ST-T changes.  Assessment & Plan by Problem:  Keith Larson is an 80 year old man with a history of chronic obstructive pulmonary disease, stage IV chronic kidney disease, atrial fibrillation, hypertension, and hyperlipidemia who presented to the emergency department after falling out of bed and spending the night on the floor. Clinically he is in rhabdomyolysis. Fortunately his renal function is at baseline. It is unclear why he was unable to get up even after speaking with him this morning.  1) Rhabdomyolysis: He will be aggressively hydrated with intravenous normal saline at 150 cc an hour until his CK drops below 1000. At this point we do not feel that alkalinization of his urine is necessary.  2) Fall: We will have physical therapy work with him while he is an inpatient in order to assess his strength and coordination and  continue to make sure he remains active even with the muscle aches associated with rhabdomyolysis. Skilled nursing facility placement was recommended to him but he prefers home health physical therapy and this will likely be set up at discharge.  3) Disposition: He will remain in the hospital on intravenous fluids until his total CK drops below 1000. While an inpatient he will be receiving physical therapy.

## 2014-09-15 ENCOUNTER — Encounter (HOSPITAL_COMMUNITY): Payer: Self-pay | Admitting: General Practice

## 2014-09-15 LAB — BASIC METABOLIC PANEL
ANION GAP: 8 (ref 5–15)
BUN: 30 mg/dL — AB (ref 6–23)
CO2: 26 mEq/L (ref 19–32)
CREATININE: 2 mg/dL — AB (ref 0.50–1.35)
Calcium: 8.1 mg/dL — ABNORMAL LOW (ref 8.4–10.5)
Chloride: 108 mEq/L (ref 96–112)
GFR, EST AFRICAN AMERICAN: 35 mL/min — AB (ref >90)
GFR, EST NON AFRICAN AMERICAN: 30 mL/min — AB (ref >90)
Glucose, Bld: 138 mg/dL — ABNORMAL HIGH (ref 70–99)
POTASSIUM: 5 meq/L (ref 3.7–5.3)
Sodium: 142 mEq/L (ref 137–147)

## 2014-09-15 LAB — PROTIME-INR
INR: 2.29 — AB (ref 0.00–1.49)
PROTHROMBIN TIME: 25.4 s — AB (ref 11.6–15.2)

## 2014-09-15 LAB — CK: Total CK: 3328 U/L — ABNORMAL HIGH (ref 7–232)

## 2014-09-15 MED ORDER — WARFARIN SODIUM 2 MG PO TABS
2.0000 mg | ORAL_TABLET | Freq: Once | ORAL | Status: DC
  Filled 2014-09-15: qty 1

## 2014-09-15 MED FILL — Guaifenesin Liquid 100 MG/5ML: ORAL | Qty: 10 | Status: AC

## 2014-09-15 NOTE — Progress Notes (Signed)
Physical Therapy Treatment Patient Details Name: Keith PellegriniFrank Larson MRN: [id number] DOB: [birth year] Today's Date: 09/15/2014    History of Present Illness 80 year old man with a history of chronic obstructive pulmonary disease, stage IV chronic kidney disease, atrial fibrillation, hypertension, and hyperlipidemia who presented to the emergency department after falling out of bed and spending the night on the floor.CXR showed hyperexpanded lung fields which is consistent with COPD.    PT Comments    Pt admitted with above. Pt currently with functional limitations due to endurance deficits.  Progressing with pt able to ambulate with less dyspnea and no assist.  HHPT is appropriate and pt to borrow RW if needed per pt.  Still needs home O2 as he desats with activity.  Pt will benefit from skilled PT to increase their independence and safety with mobility to allow discharge to the venue listed below.   Follow Up Recommendations  Home health PT;Supervision for mobility/OOB     Equipment Recommendations  Rolling walker with 5" wheels    Recommendations for Other Services       Precautions / Restrictions Precautions Precautions: Fall Precaution Comments: watch O2 Restrictions Weight Bearing Restrictions: No    Mobility  Bed Mobility Overal bed mobility: Needs Assistance Bed Mobility: Supine to Sit Rolling: Independent            Transfers Overall transfer level: Modified independent Equipment used: None Transfers: Sit to/from Stand Sit to Stand: Modified independent (Device/Increase time) Stand pivot transfers: Supervision       General transfer comment: Used armrests to push up to stand.  Took incr time for pt to get up and balance but pt did independently.   Ambulation/Gait Ambulation/Gait assistance: Min guard Ambulation Distance (Feet): 250 Feet Assistive device: None Gait Pattern/deviations: Step-through pattern;Decreased stride length;Narrow base of  support;Antalgic;Trunk flexed   Gait velocity interpretation: Below normal speed for age/gender General Gait Details: hesitant, truncated stride, slighty forward posture; minimal DOE noted/reportedPt most likely close to baseline.  Pt feels he is ready to go home and will borrow RW if needed per pt.     Stairs            Wheelchair Mobility    Modified Rankin (Stroke Patients Only)       Balance Overall balance assessment: Needs assistance Sitting-balance support: Feet supported Sitting balance-Leahy Scale: Good     Standing balance support: No upper extremity supported;During functional activity Standing balance-Leahy Scale: Fair Standing balance comment: can withstand min challenges to balance.                      Cognition Arousal/Alertness: Awake/alert Behavior During Therapy: WFL for tasks assessed/performed Overall Cognitive Status: Within Functional Limits for tasks assessed                      Exercises      General Comments        Pertinent Vitals/Pain Pain Assessment: No/denies pain Pain Score: 5  Pain Location: arms and legs Pain Descriptors / Indicators: Aching Pain Intervention(s): Limited activity within patient's tolerance;Monitored during session Desat to 84% on RA.  Needs 2 LO2.     Home Living Family/patient expects to be discharged to:: Private residence Living Arrangements: Alone Available Help at Discharge: Friend(s);Available 24 hours/day Type of Home: House Home Access: Stairs to enter Entrance Stairs-Rails: Left Home Layout: One level Home Equipment: Bedside commode;Shower seat;Grab bars - tub/shower;Hospital bed;Walker - 2 wheels (canborrow RW from brother) Additional Comments: Neighbor  can stay with patient for home d/c if needed    Prior Function Level of Independence: Independent      Comments: drives, cooks, tends to yard; has cleaning person every 2 weeks   PT Goals (current goals can now be found in  the care plan section) Acute Rehab PT Goals Patient Stated Goal: get back home Progress towards PT goals: Progressing toward goals    Frequency  Min 3X/week    PT Plan Discharge plan needs to be updated    Co-evaluation             End of Session Equipment Utilized During Treatment: Gait belt;Oxygen Activity Tolerance: Patient limited by fatigue Patient left: in chair;with call bell/phone within reach     Time: 1146-1200 PT Time Calculation (min): 14 min  Charges:  $Gait Training: 8-22 mins                    G Codes:      WhiteAmadeo Garnet 09-19-14, 1:47 PM [employer] [facility] [phone] (pager)

## 2014-09-15 NOTE — Progress Notes (Signed)
Occupational Therapy Evaluation Patient Details Name: Keith PellegriniFrank Wymer MRN: [id number] DOB: 08/14/1932 Today's Date: 09/15/2014    History of Present Illness 80 year old man with a history of chronic obstructive pulmonary disease, stage IV chronic kidney disease, atrial fibrillation, hypertension, and hyperlipidemia who presented to the emergency department after falling out of bed and spending the night on the floor.CXR showed hyperexpanded lung fields which is consistent with COPD.   Clinical Impression   PTA, pt lived alone and was independent with ADL and mobility. Pt making progress. Pt states he feels that he is doing much better. Completed ADL session with O2 remaining @ 90-92 on 2L O2 @ S to minguard level. Pt will have 24/7 S after D/C. Feel pt is safe to D/C home when medically stable with HHOT. Will follow acutely to facilitate safe D/C home and maximize independence with ADL and mobility with necessary DME and Energy conservation techniques.    Follow Up Recommendations  Home health OT;Supervision/Assistance - 24 hour    Equipment Recommendations  None recommended by OT    Recommendations for Other Services       Precautions / Restrictions Precautions Precautions: Fall Precaution Comments: watch O2 Restrictions Weight Bearing Restrictions: No      Mobility Bed Mobility Overal bed mobility: Needs Assistance Bed Mobility: Supine to Sit Rolling: Supervision            Transfers Overall transfer level: Needs assistance Equipment used: 1 person hand held assist Transfers: Stand Pivot Transfers;Sit to/from Stand Sit to Stand: Supervision Stand pivot transfers: Supervision            Balance Overall balance assessment: Needs assistance Sitting-balance support: Feet supported Sitting balance-Leahy Scale: Good     Standing balance support: During functional activity Standing balance-Leahy Scale: Fair                              ADL Overall  ADL's : Needs assistance/impaired     Grooming: Set up;Standing;Supervision/safety   Upper Body Bathing: Set up;Supervision/ safety;Sitting   Lower Body Bathing: Minimal assistance;Sit to/from stand   Upper Body Dressing : Set up;Sitting   Lower Body Dressing: Minimal assistance;Sit to/from stand   Toilet Transfer: Min guard;Ambulation   Toileting- Clothing Manipulation and Hygiene: Supervision/safety;Sit to/from stand       Functional mobility during ADLs: Min guard General ADL Comments: Making progress. Fatigues easily. Began Customer service managereducating on Energy conservation techniques     Vision                     Perception     Praxis      Pertinent Vitals/Pain Pain Assessment: 0-10 Pain Score: 5  Pain Location: arms and legs Pain Descriptors / Indicators: Aching Pain Intervention(s): Limited activity within patient's tolerance;Monitored during session     Hand Dominance Right   Extremity/Trunk Assessment Upper Extremity Assessment Upper Extremity Assessment: Generalized weakness   Lower Extremity Assessment Lower Extremity Assessment: Generalized weakness   Cervical / Trunk Assessment Cervical / Trunk Assessment: Normal   Communication Communication Communication: HOH   Cognition Arousal/Alertness: Awake/alert Behavior During Therapy: WFL for tasks assessed/performed Overall Cognitive Status: Within Functional Limits for tasks assessed                     General Comments       Exercises       Shoulder Instructions      Home Living Family/patient expects to  be discharged to:: Private residence Living Arrangements: Alone Available Help at Discharge: Friend(s);Available 24 hours/day Type of Home: House Home Access: Stairs to enter [employer] of Steps: 4 Entrance Stairs-Rails: Left Home Layout: One level     Bathroom Shower/Tub: Tub/shower unit Shower/tub characteristics: Engineer, building services: Standard Bathroom  Accessibility: Yes How Accessible: Accessible via walker Home Equipment: Bedside commode;Shower seat;Grab bars - tub/shower;Hospital bed;Walker - 2 wheels (canborrow RW from brother)   Additional Comments: Neighbor can stay with patient for home d/c if needed      Prior Functioning/Environment Level of Independence: Independent        Comments: drives, cooks, tends to yard; has cleaning person every 2 weeks    OT Diagnosis: Generalized weakness   OT Problem List: Decreased strength;Decreased activity tolerance;Impaired balance (sitting and/or standing);Decreased knowledge of use of DME or AE;Cardiopulmonary status limiting activity;Pain   OT Treatment/Interventions: Self-care/ADL training;Therapeutic exercise;Energy conservation;DME and/or AE instruction;Therapeutic activities;Patient/family education;Balance training    OT Goals(Current goals can be found in the care plan section) Acute Rehab OT Goals Patient Stated Goal: get back home OT Goal Formulation: With patient Time For Goal Achievement: 09/29/14 Potential to Achieve Goals: Good  OT Frequency: Min 2X/week   Barriers to D/C:            Co-evaluation              End of Session Equipment Utilized During Treatment: Gait belt;Oxygen (2L) Nurse Communication: Mobility status  Activity Tolerance: Patient tolerated treatment well Patient left: in chair;with call bell/phone within reach   Time: 0927-0957 OT Time Calculation (min): 30 min Charges:  OT General Charges $OT Visit: 1 Procedure OT Evaluation $Initial OT Evaluation Tier I: 1 Procedure OT Treatments $Self Care/Home Management : 23-37 mins G-Codes:    Kaija Kovacevic,HILLARY 2014-10-03, 10:11 AM   Luisa Dago, OTR/L  [phone] 10-03-14

## 2014-09-15 NOTE — Progress Notes (Signed)
Pt seen and examined with Dr. Beckie Salts. Please refer to resident note for details  Pt feels well today. Complains of mild cramps in thighs but much improved.   Exam: Gen: AAO*3, NAD CV: irregular, normal heart sounds Pulm: CTA b/l Abd: soft, non tender, BS + Ext: no pedal edema, no tenderness to palpation   Assessment and Plan: 80 y/o male p/w rhabdomyolysis s/p fall  Rhabdomyolysis: - Now resolving - CPK < 5000 and creatinine is at baseline - Would d/c IVF  - no further w/u for now  COPD exacerbation: - c/w prednisone taper, symbicort, nebs prn and O2   CKD: - Creatinine is at baseline. IVF dc'd - repeat CK and BMP as outpatient  Pt stable for d/c home today

## 2014-09-15 NOTE — Discharge Summary (Signed)
INTERNAL MEDICINE ATTENDING DISCHARGE COSIGN   I evaluated the patient on the day of discharge and discussed the discharge plan with my resident team. I agree with the discharge documentation and disposition.   Natsuko Kelsay 09/15/2014, 5:01 PM

## 2014-09-15 NOTE — Discharge Summary (Signed)
Name: Keith Larson MRN: [id number] DOB: [birth year] 80 y.o. PCP: Pcp Not In System  Date of Admission: 09/13/2014  9:20 AM Date of Discharge: 09/15/2014 Attending Physician: Earl Lagos, MD  Discharge Diagnosis: 1.  Active Problems:   Rhabdomyolysis  Discharge Medications:   Medication List    STOP taking these medications       pravastatin 20 MG tablet  Commonly known as:  PRAVACHOL      TAKE these medications       albuterol 108 (90 BASE) MCG/ACT inhaler  Commonly known as:  PROVENTIL HFA;VENTOLIN HFA  Inhale 2 puffs into the lungs 2 (two) times daily.     furosemide 20 MG tablet  Commonly known as:  LASIX  Take 1 tablet (20 mg total) by mouth daily as needed for edema.     guaiFENesin 100 MG/5ML liquid  Commonly known as:  ROBITUSSIN  Take 200 mg by mouth 4 (four) times daily as needed for cough.     metoprolol 50 MG tablet  Commonly known as:  LOPRESSOR  Take 25 mg by mouth 2 (two) times daily.     predniSONE 10 MG tablet  Commonly known as:  DELTASONE  Take 4 tablets (40 mg total) by mouth daily with breakfast.     SYMBICORT 160-4.5 MCG/ACT inhaler  Generic drug:  budesonide-formoterol  Inhale 1 puff into the lungs 2 (two) times daily.     tiotropium 18 MCG inhalation capsule  Commonly known as:  SPIRIVA  Place 1 capsule (18 mcg total) into inhaler and inhale daily.     warfarin 2 MG tablet  Commonly known as:  COUMADIN  Take 4-6 mg by mouth every evening. Takes 2 tablets daily, except for Mon, Wed- takes 6mg .        Disposition and follow-up:   Keith Larson was discharged from [facility] in Good condition.  At the hospital follow up visit please address:  1.  Elevated LFTs- stopped Pravastatin due to elevated LFTs. Repeat LFTs and assess if pt can go back on med.   2.  Labs / imaging needed at time of follow-up: LFTs, bmet  3.  Pending labs/ test needing follow-up: None  Follow-up Appointments:   Discharge  Instructions: Discharge Instructions   Diet - low sodium heart healthy    Complete by:  As directed      Increase activity slowly    Complete by:  As directed            Consultations:    Procedures Performed:  Dg Chest 2 View  09/13/2014   CLINICAL DATA:  Shortness of breath for 1 year. Status post fall last night.  EXAM: CHEST  2 VIEW  COMPARISON:  Single view of the chest 03/08/2014 and 02/05/2013.  FINDINGS: The chest is hyperexpanded but the lungs are clear. Peribronchial thickening is noted. No pneumothorax or pleural effusion. Heart size is upper normal.  IMPRESSION: Findings compatible with COPD.  No acute abnormality.   Electronically Signed   By: Drusilla Kanner M.D.   On: 09/13/2014 12:47   Dg Hip Complete Left  09/13/2014   CLINICAL DATA:  Status post fall from bed. Left hip pain. Initial encounter.  EXAM: LEFT HIP - COMPLETE 2+ VIEW  COMPARISON:  None.  FINDINGS: No acute bony or joint abnormality is identified. Mild degenerative change is present about the hips. No focal bony lesion.  IMPRESSION: No acute abnormality.   Electronically Signed   By: Drusilla Kanner  M.D.   On: 09/13/2014 14:07   Ct Head Wo Contrast  09/13/2014   CLINICAL DATA:  Patient found on floor.  EXAM: CT HEAD WITHOUT CONTRAST  TECHNIQUE: Contiguous axial images were obtained from the base of the skull through the vertex without contrast.  COMPARISON:  11/04/2010  FINDINGS: No evidence for acute hemorrhage, mass lesion, midline shift, hydrocephalus or large infarct. There is low density in the periventricular white matter suggesting chronic changes. Mild cerebral atrophy is unchanged. Mucosal thickening in the right maxillary sinus is likely chronic. Polyp or retention cyst in the anterior left sphenoid sinus. Mucosal thickening in the ethmoid air cells. No acute bone abnormality.  IMPRESSION: No acute intracranial abnormality.  Low density in the periventricular white matter suggests chronic small vessel  ischemic changes.  Paranasal sinus disease.   Electronically Signed   By: Richarda OverlieAdam  Henn M.D.   On: 09/13/2014 11:05    Admission HPI: Keith Larson is a 80yo man w/ PMHx of AFib on coumadin, CAD s/p cardiac cath in 2012, TIA, ischemic CMO (last echo in 01/2013 showed EF 55-60% with no wall motion abnormalities, last Myoview in 2014 was low risk), COPD, HTN, CKD Stage IV, and HLD who presented to the ED after having a fall last night. Patient reports he went to bed and when he woke up he was on the floor. He states he did not feel well last night. He does not remember how he got onto the floor and does not believe he hit his head. He states he has some pain on his left ribs and his left hip. He states he tried to get up after falling but was not able to move. He reports he had to wait until his family came over before he was able to get up off the floor. He denies dizziness, headache, slurred speech, chest pain, abdominal pain, nausea, vomiting. He reports good PO intake.   Patient states he has been coughing a lot the past 2 weeks. He reports a productive cough with green sputum. He states he is on 2 L of oxygen at home. He reports SOB that is near his baseline, but has noticed a lot of wheezing. He denies fever, chills.   In the ED, patient found to have a CK of [id number] and a large amount of Hbg in his urine. His Cr was 2.18, which is his baseline. He received a 1.5 L bolus. An x-ray of the left hip showed no acute fracture. CXR showed hyperexpanded lung fields which is consistent with COPD.   Hospital Course by problem list: Active Problems:   Rhabdomyolysis  Keith Larson is a 80yo man w/ PMHx of AFib on coumadin, CAD s/p cardiac cath in 2012, TIA, ischemic CMO (last echo in 01/2013 showed EF 55-60% with no wall motion abnormalities, last Myoview in 2014 was low risk), COPD, HTN, CKD Stage IV, and HLD who presented to the ED after having a fall last night, found to have CK 12000 consistent with rhabdomyolysis.    1. Rhabdomyolysis: Patient presented after falling out of bed and then was too weak to get up. He remained on the floor for most of the night until his family found him the next morning. On admission, he was found to have a CK of 12,000 and a large amount of hemoglobin in his urine consistent with rhabdomyolysis. CT Head was performed which was negative for acute abnormalities. He received a 1.5 L bolus of NS in the ED. Hydration was continued  with NS @ 100 ml/hr overnight given his history of heart failure. His CK improved to 10,000--> 7,800 the next morning. His Cr remained stable around 2.0, which is near his baseline. IVF were increased to 150 ml/hr. On 10/19, his CK improved to 3300 and Cr stable. He was discharged home with recommendations to drink plenty of fluids and to have physical therapy with his home nurse. He was provided a rolling walker at discharge.   2. COPD Exacerbation: Patient presented with 2 week history of productive cough. He stated that he is compliant with his inhalers at home. He takes Proventil BID, Spiriva daily, Symbicort BID, and Prednisone 40 mg daily at home. He is also on home oxygen, 2L. He was placed on Symbicort, Duonebs Q4H, oxygen therapy at 2 L, and Prednisone 40 mg daily. His oxygen saturations remained stable and he was discharged on his home medications.  3. Atrial Fibrillation: Patient takes coumadin at home. INR remained therapeutic throughout his hospitalization with his INR at discharge at 2.29. He was continued on coumadin that was dosed by pharmacy. He was discharged on his home dose of coumadin.  4. Hx of Ischemic Cardiomyopathy: Patient had cardiac cath in 2012 found to have occlusive disease. His last echo was in 01/2013 which showed EF 55-60%, no wall motion abnormalities. He also had a Myoview in 01/2013 which showed low risk. He takes Metoprolol 50 mg BID and Lasix 20 mg daily PRN edema. He was continued on his home Metoprolol. Lasix was not  necessary. He was discharged on his home medications.   5. HTN: BP 131/71 on admission. Stable in 120-150s/70s. Patient takes Metoprolol 50 mg BID and Lasix 20 mg daily PRN edema at home. He was continued on his home Metoprolol and discharged on his home meds.  6. CKD Stage IV: GFR 31 on admission, Cr 2.18, near baseline. Cr remained stable around 2.0.   7. HLD: Last lipid profile on 10/2010 shows Chol 132, Trigly 42, HDL 35, LDL 89. Patient takes Pravastatin 20 mg daily at home. He was continued on his home Pravastatin. His LFTs were found to be elevated with AST 216, ALT 55. His Pravastatin was then discontinued and he was instructed to not take Pravastatin until follow up with his PCP and have LFTs repeated.    Discharge Vitals:   BP 149/67  Pulse 70  Temp(Src) 98.6 F (37 C) (Oral)  Resp 18  Ht 5\' 8"  (1.727 m)  Wt 176 lb 12.8 oz (80.196 kg)  BMI 26.89 kg/m2  SpO2 95% Physical Exam  General: alert, sitting up in bed eating breakfast, NAD  HEENT: [place]/AT, EOMI, mucus membranes moist  CV: irregularly irregular, no m/g/r  Pulm: mild wheezing bilaterally, breathing comfortably on 2 L oxygen via [employer]  Abd: BS+, soft, non-tender  Ext: warm, no edema, moves all  Neuro: alert and oriented x 3, pleasant, CNs II-XII intact  Discharge Labs:  Results for orders placed during the hospital encounter of 09/13/14 (from the past 24 hour(s))  PROTIME-INR     Status: Abnormal   Collection Time    09/15/14  3:48 AM      Result Value Ref Range   Prothrombin Time 25.4 (*) 11.6 - 15.2 seconds   INR 2.29 (*) 0.00 - 1.49  CK     Status: Abnormal   Collection Time    09/15/14  3:48 AM      Result Value Ref Range   Total CK 3328 (*) 7 - 232 U/L  BASIC METABOLIC PANEL     Status: Abnormal   Collection Time    09/15/14  3:48 AM      Result Value Ref Range   Sodium 142  137 - 147 mEq/L   Potassium 5.0  3.7 - 5.3 mEq/L   Chloride 108  96 - 112 mEq/L   CO2 26  19 - 32 mEq/L   Glucose, Bld 138 (*) 70  - 99 mg/dL   BUN 30 (*) 6 - 23 mg/dL   Creatinine, Ser 6.83 (*) 0.50 - 1.35 mg/dL   Calcium 8.1 (*) 8.4 - 10.5 mg/dL   GFR calc non Af Amer 30 (*) >90 mL/min   GFR calc Af Amer 35 (*) >90 mL/min   Anion gap 8  5 - 15    Signed: Rich Number, MD 09/15/2014, 2:44 PM    Services Ordered on Discharge: None Equipment Ordered on Discharge: Dan Humphreys

## 2014-09-15 NOTE — Progress Notes (Signed)
ANTICOAGULATION CONSULT NOTE - Initial Consult  Pharmacy Consult for Warfarin Indication: atrial fibrillation  No Known Allergies  Patient Measurements: Height: 5\' 8"  (172.7 cm) Weight: 176 lb 12.8 oz (80.196 kg) IBW/kg (Calculated) : 68.4  Vital Signs: Temp: 98.7 F (37.1 C) (10/19 0428) Temp Source: Oral (10/19 0428) BP: 127/61 mmHg (10/19 1014) Pulse Rate: 77 (10/19 1014)  Labs:  Recent Labs  09/13/14 0938 09/13/14 1845 09/14/14 0007 09/14/14 0645 09/15/14 0348  HGB 12.3*  --  11.4*  --   --   HCT 39.0  --  36.4*  --   --   PLT 176  --  138*  --   --   LABPROT 25.8*  --  26.1*  --  25.4*  INR 2.34*  --  2.37*  --  2.29*  CREATININE 2.18* 1.98* 2.07*  --  2.00*  CKTOTAL 44920*  --  10033* 7788* 3328*  TROPONINI <0.30 <0.30 <0.30 <0.30  --     Estimated Creatinine Clearance: 28.5 ml/min (by C-G formula based on Cr of 2).  Assessment: 82 YOM on coumadin PTA for afib, who presented to the [facility] on 10[facility] s/p fall, and noted to have rhabdomylosis. Heard CT negative for bleeding. INR therapeutic = 2.29 today, received lowered than home dose in the past 2 days, since the patient is noted to have rhabdo and elevated AST which are known to increase warfarin sensitivity.   PTA dose of 4 mg daily EXCEPT for 6 mg on Mon/Wed.   Goal of Therapy:  INR 2-3   Plan:  1. Warfarin 2 mg x 1 dose at 1800 today 2. Daily PT/INR 3. Will continue to monitor for any signs/symptoms of bleeding and will follow up with PT/INR in the a.m.   Bayard Hugger, PharmD, BCPS  Clinical Pharmacist  Pager: [phone]  09/15/2014 1:15 PM

## 2014-09-15 NOTE — Progress Notes (Signed)
Patient d/c home this afternoon. D/c instruction given and patient verbalized that he already have an aide at home so HHPT/OT  Was not ordered. He also mentioned he has a walker borrowed from someone as well. Assessments remains unchanged prior to discharge.

## 2014-09-15 NOTE — Progress Notes (Signed)
SATURATION QUALIFICATIONS: (This note is used to comply with regulatory documentation for home oxygen)  Patient Saturations on Room Air at Rest = 92%  Patient Saturations on Room Air while Ambulating = 84%  Patient Saturations on 2 Liters of oxygen while Ambulating = 91%  Please briefly explain why patient needs home oxygen:Pt desats without O2 with activity.  Will need O2 for home.  Thanks. [facility] Acute Rehabilitation [phone] (pager)

## 2014-09-15 NOTE — Progress Notes (Signed)
Subjective: Patient states he slept well and does not have as severe of muscle cramps in his legs anymore. He reports good appetite.   Objective: Vital signs in last 24 hours: Filed Vitals:   09/14/14 2035 09/15/14 0428 09/15/14 0912 09/15/14 1014  BP: 118/55 121/53  127/61  Pulse: 92 73  77  Temp: 98.7 F (37.1 C) 98.7 F (37.1 C)    TempSrc: Oral Oral    Resp: 19 20    Height:      Weight:      SpO2: 98% 98% 99%    Weight change:   Intake/Output Summary (Last 24 hours) at 09/15/14 1148 Last data filed at 09/15/14 0900  Gross per 24 hour  Intake   1649 ml  Output   1150 ml  Net    499 ml   Physical Exam General: alert, sitting up in bed eating breakfast, NAD HEENT: [place]/AT, EOMI, mucus membranes moist CV: irregularly irregular, no m/g/r Pulm: mild wheezing bilaterally, breathing comfortably on 2 L oxygen via [place] Abd: BS+, soft, non-tender Ext: warm, no edema, moves all Neuro: alert and oriented x 3, pleasant, CNs II-XII intact  Lab Results: Basic Metabolic Panel:  Recent Labs Lab 09/14/14 0007 09/15/14 0348  NA 140 142  K 4.1 5.0  CL 104 108  CO2 29 26  GLUCOSE 144* 138*  BUN 30* 30*  CREATININE 2.07* 2.00*  CALCIUM 8.2* 8.1*   Liver Function Tests:  Recent Labs Lab 09/13/14 0938 09/14/14 0007  AST 184* 216*  ALT 44 55*  ALKPHOS 53 50  BILITOT 0.3 0.3  PROT 6.4 5.9*  ALBUMIN 3.1* 2.7*   No results found for this basename: LIPASE, AMYLASE,  in the last 168 hours No results found for this basename: AMMONIA,  in the last 168 hours CBC:  Recent Labs Lab 09/13/14 0938 09/14/14 0007  WBC 7.9 6.9  HGB 12.3* 11.4*  HCT 39.0 36.4*  MCV 91.5 93.8  PLT 176 138*   Cardiac Enzymes:  Recent Labs Lab 09/13/14 1845 09/14/14 0007 09/14/14 0645 09/15/14 0348  CKTOTAL  --  10033* 7788* 3328*  TROPONINI <0.30 <0.30 <0.30  --    BNP: No results found for this basename: PROBNP,  in the last 168 hours D-Dimer: No results found for this basename:  DDIMER,  in the last 168 hours CBG: No results found for this basename: GLUCAP,  in the last 168 hours Hemoglobin A1C: No results found for this basename: HGBA1C,  in the last 168 hours Fasting Lipid Panel: No results found for this basename: CHOL, HDL, LDLCALC, TRIG, CHOLHDL, LDLDIRECT,  in the last 168 hours Thyroid Function Tests: No results found for this basename: TSH, T4TOTAL, FREET4, T3FREE, THYROIDAB,  in the last 168 hours Coagulation:  Recent Labs Lab 09/13/14 0938 09/14/14 0007 09/15/14 0348  LABPROT 25.8* 26.1* 25.4*  INR 2.34* 2.37* 2.29*   Anemia Panel: No results found for this basename: VITAMINB12, FOLATE, FERRITIN, TIBC, IRON, RETICCTPCT,  in the last 168 hours Urine Drug Screen: Drugs of Abuse  No results found for this basename: labopia, cocainscrnur, labbenz, amphetmu, thcu, labbarb    Alcohol Level: No results found for this basename: ETH,  in the last 168 hours Urinalysis:  Recent Labs Lab 09/13/14 1134  COLORURINE AMBER*  LABSPEC 1.019  PHURINE 7.5  GLUCOSEU NEGATIVE  HGBUR LARGE*  BILIRUBINUR NEGATIVE  KETONESUR NEGATIVE  PROTEINUR >300*  UROBILINOGEN 0.2  NITRITE NEGATIVE  LEUKOCYTESUR TRACE*   Micro Results: No results found  for this or any previous visit (from the past 240 hour(s)). Studies/Results: Dg Chest 2 View  09/13/2014   CLINICAL DATA:  Shortness of breath for 1 year. Status post fall last night.  EXAM: CHEST  2 VIEW  COMPARISON:  Single view of the chest 03/08/2014 and 02/05/2013.  FINDINGS: The chest is hyperexpanded but the lungs are clear. Peribronchial thickening is noted. No pneumothorax or pleural effusion. Heart size is upper normal.  IMPRESSION: Findings compatible with COPD.  No acute abnormality.   Electronically Signed   By: Drusilla Kanner M.D.   On: 09/13/2014 12:47   Dg Hip Complete Left  09/13/2014   CLINICAL DATA:  Status post fall from bed. Left hip pain. Initial encounter.  EXAM: LEFT HIP - COMPLETE 2+ VIEW   COMPARISON:  None.  FINDINGS: No acute bony or joint abnormality is identified. Mild degenerative change is present about the hips. No focal bony lesion.  IMPRESSION: No acute abnormality.   Electronically Signed   By: Drusilla Kanner M.D.   On: 09/13/2014 14:07   Medications: I have reviewed the patient's current medications. Scheduled Meds: . budesonide-formoterol  1 puff Inhalation BID  . ipratropium-albuterol  3 mL Nebulization TID  . metoprolol  25 mg Oral BID  . pravastatin  20 mg Oral Daily  . predniSONE  40 mg Oral Q breakfast  . sodium chloride  3 mL Intravenous Q12H  . Warfarin - Pharmacist Dosing Inpatient   Does not apply q1800   Continuous Infusions: . sodium chloride 150 mL/hr at 09/15/14 0911   PRN Meds:.acetaminophen, acetaminophen, guaiFENesin, ipratropium-albuterol Assessment/Plan:  Keith Larson is a 80yo man w/ PMHx of AFib on coumadin, CAD s/p cardiac cath in 2012, TIA, ischemic CMO (last echo in 01/2013 showed EF 55-60% with no wall motion abnormalities, last Myoview in 2014 was low risk), COPD, HTN, CKD Stage IV, and HLD who presented to the ED after having a fall last night, found to have CK 12000 consistent with rhabdomyolysis.   1. Rhabdomyolysis- Improving: CK continues to trend down, 7800-->3300 this morning. Will continue IVF @ 150 ml/hr. Recheck CK this afternoon. If continues to go down, can d/c today.  - Continue IVF @ 150 ml/hr  - Repeat CK this afternoon - Cardiac monitoring   2. COPD Exacerbation: Patient presented with 2 week history of productive cough. He does not feel more SOB than his baseline. He is breathing comfortably on 2L oxygen via [place]. He was placed on Prednisone, Symbicort, and Duonebs.  - Continue Symbicort  - Continue Duonebs Q4H  - Continue Prednisone 40 mg daily  - Continue oxygen therapy  - Pulse oximetry   3. Atrial Fibrillation: Patient takes coumadin at home. INR therapeutic at 2.29 - Continue coumadin   4. Hx of Ischemic  Cardiomyopathy: Patient had cardiac cath in 2012 found to have occlusive disease. His last echo was in 01/2013 which showed EF 55-60%, no wall motion abnormalities. He also had a Myoview in 01/2013 which showed low risk. He takes Metoprolol 50 mg BID and Lasix 20 mg daily PRN edema.  - Continue Metoprolol  - Lasix not needed at this time   5. HTN: BP 131/71 on admission. Stable in 120-150s/70s. Patient takes Metoprolol 50 mg BID and Lasix 20 mg daily PRN edema at home.  - Continue home Metoprolol   6. CKD Stage IV: GFR 31 on admission, Cr 2.18, near baseline. Cr around 2 now.  - Continue to monitor   7. HLD: Last lipid  profile on 10/2010 shows Chol 132, Trigly 42, HDL 35, LDL 89. Patient takes Pravastatin 20 mg daily at home.  - Continue Pravastatin   Diet: Heart healthy  DVT/PE PPx: Coumadin  Dispo: Likely discharge today  The patient does have a current PCP (Pcp Not In System) and [facility].  The patient does not have transportation limitations that hinder transportation to clinic appointments.  .Services Needed at time of discharge: Y = Yes, Blank = No PT:   OT:   RN:   Equipment:   Other:     LOS: 2 days   Keith Numberarly Rivet, MD 09/15/2014, 11:48 AM

## 2014-09-15 NOTE — Discharge Instructions (Signed)
It was a pleasure taking care of you, Mr. Keith Larson.  Please drink lots of fluids when you go home. You were hospitalized for rhabdomyolysis and I have provided some information about this below. Also stop taking your Pravastatin (cholesterol medication) for now. Ask your primary care doctor about restarting this medication. Please follow up with your primary care doctor at the [facility] [place] later this week or early next week.   Take care, Keith Larson  Rhabdomyolysis Rhabdomyolysis is the breakdown of muscle fibers due to injury. The injury may come from physical damage to the muscle like an injury but other causes are:  High fever (hyperthermia).  Seizures (convulsions).  Low phosphate levels.  Diseases of metabolism.  Heatstroke.  Drug toxicity.  Over exertion.  Alcoholism.  Muscle is cut off from oxygen (anoxia).  The squeezing of nerves and blood vessels (compartment syndrome). Some drugs which may cause the breakdown of muscle are:  Antibiotics.  Statins.  Alcohol.  Animal toxins. Myoglobin is a substance which helps muscle use oxygen. When the muscle is damaged, the myoglobin is released into the bloodstream. It is filtered out of the bloodstream by the kidneys. Myoglobin may block up the kidneys. This may cause damage, such as kidney failure. It also breaks down into other damaging toxic parts, which also cause kidney failure.  SYMPTOMS   Dark, red, or tea colored urine.  Weakness of affected muscles.  Weight gain from water retention.  Joint aches and pains.  Irregular heart from high potassium in the blood.  Muscle tenderness or aching.  Generalized weakness.  Seizures.  Feeling tired (fatigue). DIAGNOSIS  Your caregiver may find muscle tenderness on exam and suspect the problem. Urine tests and blood work can confirm the problem. TREATMENT   Early and aggressive treatment with large amounts of fluids may help prevent kidney failure.  Water producing medicine  (diuretic) may be used to help flush the kidneys.  High potassium and calcium problems (electrolyte) in your blood may need treatment. HOME CARE INSTRUCTIONS  This problem is usually cared for in a hospital. If you are allowed to go home and require dialysis, make sure you keep all appointments for lab work and dialysis. Not doing so could result in death. Document Released: Nov 03, 2004 Document Revised: 02/06/2012 Document Reviewed: 05/11/2009 [facility] Patient Information 2015 [place], [place]. This information is not intended to replace advice given to you by your health care provider. Make sure you discuss any questions you have with your health care provider.

## 2014-09-16 NOTE — Care Management Note (Signed)
    Page 1 of 1   09/16/2014     4:40:24 PM CARE MANAGEMENT NOTE 09/16/2014  Patient:  [facility]   Account Number:  [id number]  Date Initiated:  09/15/2014  Documentation initiated by:  Aissatou Fronczak  Subjective/Objective Assessment:   Pt adm on 10/17 with rhabdomylosis.  PTA, pt resided at home alone, and was independent.     Action/Plan:   Pt for dc home today.  Pt states he will borrow RW from his brother in law to use at home.  Family members at bedside state they will provide care.   Anticipated DC Date:  09/16/2014   Anticipated DC Plan:  HOME/SELF CARE      DC Planning Services  CM consult      Choice offered to / List presented to:             Status of service:  Completed, signed off Medicare Important Message given?  NA - LOS <3 / Initial given by admissions (If response is "NO", the following Medicare IM given date fields will be blank) Date Medicare IM given:   Medicare IM given by:   Date Additional Medicare IM given:   Additional Medicare IM given by:    Discharge Disposition:  HOME/SELF CARE  Per UR Regulation:  Reviewed for med. necessity/level of care/duration of stay  If discussed at Long Length of Stay Meetings, dates discussed:    Comments:

## 2015-02-10 ENCOUNTER — Encounter (HOSPITAL_COMMUNITY): Payer: Self-pay | Admitting: Emergency Medicine

## 2015-02-10 ENCOUNTER — Emergency Department (HOSPITAL_COMMUNITY): Payer: Commercial Managed Care - HMO

## 2015-02-10 ENCOUNTER — Inpatient Hospital Stay (HOSPITAL_COMMUNITY)
Admission: EM | Admit: 2015-02-10 | Discharge: 2015-02-13 | DRG: 190 | Disposition: A | Discharge status: Home / Self Care | Payer: Commercial Managed Care - HMO | Attending: Family Medicine | Admitting: Family Medicine

## 2015-02-10 DIAGNOSIS — I5032 Chronic diastolic (congestive) heart failure: Secondary | ICD-10-CM | POA: Diagnosis present

## 2015-02-10 DIAGNOSIS — I2510 Atherosclerotic heart disease of native coronary artery without angina pectoris: Secondary | ICD-10-CM | POA: Diagnosis present

## 2015-02-10 DIAGNOSIS — Z955 Presence of coronary angioplasty implant and graft: Secondary | ICD-10-CM | POA: Diagnosis not present

## 2015-02-10 DIAGNOSIS — I255 Ischemic cardiomyopathy: Secondary | ICD-10-CM | POA: Diagnosis present

## 2015-02-10 DIAGNOSIS — J441 Chronic obstructive pulmonary disease with (acute) exacerbation: Principal | ICD-10-CM | POA: Diagnosis present

## 2015-02-10 DIAGNOSIS — I129 Hypertensive chronic kidney disease with stage 1 through stage 4 chronic kidney disease, or unspecified chronic kidney disease: Secondary | ICD-10-CM | POA: Diagnosis present

## 2015-02-10 DIAGNOSIS — D638 Anemia in other chronic diseases classified elsewhere: Secondary | ICD-10-CM | POA: Diagnosis present

## 2015-02-10 DIAGNOSIS — F17210 Nicotine dependence, cigarettes, uncomplicated: Secondary | ICD-10-CM | POA: Diagnosis present

## 2015-02-10 DIAGNOSIS — Z7901 Long term (current) use of anticoagulants: Secondary | ICD-10-CM | POA: Diagnosis not present

## 2015-02-10 DIAGNOSIS — Z6822 Body mass index (BMI) 22.0-22.9, adult: Secondary | ICD-10-CM

## 2015-02-10 DIAGNOSIS — N183 Chronic kidney disease, stage 3 unspecified: Secondary | ICD-10-CM | POA: Insufficient documentation

## 2015-02-10 DIAGNOSIS — Z8673 Personal history of transient ischemic attack (TIA), and cerebral infarction without residual deficits: Secondary | ICD-10-CM

## 2015-02-10 DIAGNOSIS — E875 Hyperkalemia: Secondary | ICD-10-CM | POA: Diagnosis present

## 2015-02-10 DIAGNOSIS — J9611 Chronic respiratory failure with hypoxia: Secondary | ICD-10-CM | POA: Diagnosis present

## 2015-02-10 DIAGNOSIS — N179 Acute kidney failure, unspecified: Secondary | ICD-10-CM | POA: Diagnosis present

## 2015-02-10 DIAGNOSIS — Z9981 Dependence on supplemental oxygen: Secondary | ICD-10-CM | POA: Diagnosis not present

## 2015-02-10 DIAGNOSIS — E785 Hyperlipidemia, unspecified: Secondary | ICD-10-CM | POA: Diagnosis present

## 2015-02-10 DIAGNOSIS — I10 Essential (primary) hypertension: Secondary | ICD-10-CM | POA: Insufficient documentation

## 2015-02-10 DIAGNOSIS — T380X5A Adverse effect of glucocorticoids and synthetic analogues, initial encounter: Secondary | ICD-10-CM | POA: Diagnosis present

## 2015-02-10 DIAGNOSIS — I4891 Unspecified atrial fibrillation: Secondary | ICD-10-CM | POA: Diagnosis present

## 2015-02-10 DIAGNOSIS — E43 Unspecified severe protein-calorie malnutrition: Secondary | ICD-10-CM | POA: Diagnosis present

## 2015-02-10 DIAGNOSIS — R0602 Shortness of breath: Secondary | ICD-10-CM | POA: Diagnosis present

## 2015-02-10 HISTORY — DX: Dependence on supplemental oxygen: Z99.81

## 2015-02-10 LAB — PROTIME-INR
INR: 4.18 — AB (ref 0.00–1.49)
Prothrombin Time: 40.7 seconds — ABNORMAL HIGH (ref 11.6–15.2)

## 2015-02-10 LAB — CBC WITH DIFFERENTIAL/PLATELET
BASOS PCT: 1 % (ref 0–1)
Basophils Absolute: 0 10*3/uL (ref 0.0–0.1)
EOS PCT: 4 % (ref 0–5)
Eosinophils Absolute: 0.3 10*3/uL (ref 0.0–0.7)
HEMATOCRIT: 34.3 % — AB (ref 39.0–52.0)
HEMOGLOBIN: 10.1 g/dL — AB (ref 13.0–17.0)
Lymphocytes Relative: 18 % (ref 12–46)
Lymphs Abs: 1 10*3/uL (ref 0.7–4.0)
MCH: 27.7 pg (ref 26.0–34.0)
MCHC: 29.4 g/dL — AB (ref 30.0–36.0)
MCV: 94.2 fL (ref 78.0–100.0)
MONO ABS: 0.5 10*3/uL (ref 0.1–1.0)
Monocytes Relative: 9 % (ref 3–12)
Neutro Abs: 3.9 10*3/uL (ref 1.7–7.7)
Neutrophils Relative %: 68 % (ref 43–77)
Platelets: 175 10*3/uL (ref 150–400)
RBC: 3.64 MIL/uL — ABNORMAL LOW (ref 4.22–5.81)
RDW: 14.9 % (ref 11.5–15.5)
WBC: 5.8 10*3/uL (ref 4.0–10.5)

## 2015-02-10 LAB — BASIC METABOLIC PANEL
ANION GAP: 10 (ref 5–15)
BUN: 40 mg/dL — ABNORMAL HIGH (ref 6–23)
CALCIUM: 8.7 mg/dL (ref 8.4–10.5)
CO2: 25 mmol/L (ref 19–32)
Chloride: 103 mmol/L (ref 96–112)
Creatinine, Ser: 2.29 mg/dL — ABNORMAL HIGH (ref 0.50–1.35)
GFR calc Af Amer: 29 mL/min — ABNORMAL LOW (ref >90)
GFR calc non Af Amer: 25 mL/min — ABNORMAL LOW (ref >90)
Glucose, Bld: 99 mg/dL (ref 70–99)
Potassium: 5.4 mmol/L — ABNORMAL HIGH (ref 3.5–5.1)
Sodium: 138 mmol/L (ref 135–145)

## 2015-02-10 LAB — I-STAT TROPONIN, ED: TROPONIN I, POC: 0.01 ng/mL (ref 0.00–0.08)

## 2015-02-10 LAB — BRAIN NATRIURETIC PEPTIDE: B Natriuretic Peptide: 342.1 pg/mL — ABNORMAL HIGH (ref 0.0–100.0)

## 2015-02-10 MED ORDER — ALBUTEROL (5 MG/ML) CONTINUOUS INHALATION SOLN
10.0000 mg/h | INHALATION_SOLUTION | Freq: Once | RESPIRATORY_TRACT | Status: AC
  Administered 2015-02-10: 10 mg/h via RESPIRATORY_TRACT
  Filled 2015-02-10: qty 20

## 2015-02-10 MED ORDER — SODIUM CHLORIDE 0.9 % IJ SOLN
3.0000 mL | Freq: Two times a day (BID) | INTRAMUSCULAR | Status: DC
  Administered 2015-02-11 – 2015-02-13 (×6): 3 mL via INTRAVENOUS

## 2015-02-10 MED ORDER — METOPROLOL TARTRATE 25 MG PO TABS
25.0000 mg | ORAL_TABLET | Freq: Two times a day (BID) | ORAL | Status: DC
  Administered 2015-02-11 – 2015-02-13 (×5): 25 mg via ORAL
  Filled 2015-02-10 (×7): qty 1

## 2015-02-10 MED ORDER — ALBUTEROL SULFATE (2.5 MG/3ML) 0.083% IN NEBU: 2.5000 mg | INHALATION_SOLUTION | RESPIRATORY_TRACT | Status: DC

## 2015-02-10 MED ORDER — ACETAMINOPHEN 650 MG RE SUPP: 650.0000 mg | Freq: Four times a day (QID) | RECTAL | Status: DC | PRN

## 2015-02-10 MED ORDER — IPRATROPIUM-ALBUTEROL 0.5-2.5 (3) MG/3ML IN SOLN: 3.0000 mL | Freq: Four times a day (QID) | RESPIRATORY_TRACT | Status: DC

## 2015-02-10 MED ORDER — DOXYCYCLINE HYCLATE 100 MG PO TABS
100.0000 mg | ORAL_TABLET | Freq: Two times a day (BID) | ORAL | Status: DC
  Administered 2015-02-11 – 2015-02-13 (×6): 100 mg via ORAL
  Filled 2015-02-10 (×7): qty 1

## 2015-02-10 MED ORDER — ACETAMINOPHEN 325 MG PO TABS: 650.0000 mg | ORAL_TABLET | Freq: Four times a day (QID) | ORAL | Status: DC | PRN

## 2015-02-10 MED ORDER — IPRATROPIUM BROMIDE 0.02 % IN SOLN
1.0000 mg | Freq: Once | RESPIRATORY_TRACT | Status: AC
  Administered 2015-02-10: 1 mg via RESPIRATORY_TRACT
  Filled 2015-02-10: qty 5

## 2015-02-10 MED ORDER — ALBUTEROL SULFATE (2.5 MG/3ML) 0.083% IN NEBU
2.5000 mg | INHALATION_SOLUTION | RESPIRATORY_TRACT | Status: DC | PRN
Start: 2015-02-10 — End: 2015-02-13
  Administered 2015-02-12 (×3): 2.5 mg via RESPIRATORY_TRACT
  Filled 2015-02-10 (×3): qty 3

## 2015-02-10 MED ORDER — METHYLPREDNISOLONE SODIUM SUCC 125 MG IJ SOLR
125.0000 mg | Freq: Once | INTRAMUSCULAR | Status: AC
  Administered 2015-02-10: 125 mg via INTRAVENOUS
  Filled 2015-02-10: qty 2

## 2015-02-10 MED ORDER — PREDNISONE 50 MG PO TABS
50.0000 mg | ORAL_TABLET | Freq: Every day | ORAL | Status: DC
  Administered 2015-02-11 – 2015-02-13 (×3): 50 mg via ORAL
  Filled 2015-02-10 (×4): qty 1

## 2015-02-10 MED ORDER — WARFARIN SODIUM 4 MG PO TABS: 4.0000 mg | ORAL_TABLET | Freq: Every evening | ORAL | Status: DC

## 2015-02-10 MED ORDER — BUDESONIDE-FORMOTEROL FUMARATE 160-4.5 MCG/ACT IN AERO
1.0000 | INHALATION_SPRAY | Freq: Two times a day (BID) | RESPIRATORY_TRACT | Status: DC
  Administered 2015-02-11 – 2015-02-13 (×4): 1 via RESPIRATORY_TRACT
  Filled 2015-02-10 (×2): qty 6

## 2015-02-10 MED ORDER — TIOTROPIUM BROMIDE MONOHYDRATE 18 MCG IN CAPS
18.0000 ug | ORAL_CAPSULE | Freq: Every day | RESPIRATORY_TRACT | Status: DC
  Administered 2015-02-12: 18 ug via RESPIRATORY_TRACT
  Filled 2015-02-10: qty 5

## 2015-02-10 NOTE — Progress Notes (Signed)
ANTICOAGULATION CONSULT NOTE - Initial Consult  Pharmacy Consult for Warfarin  Indication: atrial fibrillation  Labs:  Recent Labs  02/10/15 1742  HGB 10.1*  HCT 34.3*  PLT 175  LABPROT 40.7*  INR 4.18*  CREATININE 2.29*   Assessment: Supra-therapeutic INR  Goal of Therapy:  INR 2-3 Monitor platelets by anticoagulation protocol: Yes   Plan:  -No warfarin tonight -Re-check INR in the AM -Re-start warfarin as INR allows  Abran Duke 02/10/2015,11:42 PM

## 2015-02-10 NOTE — ED Notes (Signed)
Pt sts increased SOB worse with exertion x 1 week; pt sts O2 was low at home and is on home O2

## 2015-02-10 NOTE — H&P (Signed)
Family Medicine Teaching [facility] Admission History and Physical Service Pager: [phone]  [name] Medical record number: [id number] Date of birth: 30-Dec-1931 Age: 81 y.o. Gender: male  Primary Care Provider: Pcp Not In System Consultants: None Code Status: Full   Chief Complaint: Shortness of Breath   Assessment and Plan: Keith Larson is a 81 y.o. male presenting with worsening shortness of breath for one month . PMH is significant for COPD, atrial fibrillation, cardiomyopathy, coronary artery disease status post stent of the AM, chronic hypoxic respiratory failure, hypertension, dyslipidemia, CKDIII.   COPD versus CHF exacerbation: Patient here with increasing shortness of breath for the past month. No known viral prodrome, no productive cough, no fevers, baseline O2 requirement of 3 L. Afebrile here, vital signs stable, white count 5.8. Wheezes diffusely 2 on exam. Increase in oxygen requirement to 5 L to maintain saturations 90%. Elevated BNP on admission to 342. Some congestion on chest x-ray. No lower extremity edema. No clear baseline weight. No orthopnea. - Admit to telemetry under Dr. Jetty Duhamel  - Duoneb every 6 hours - Albuterol every 2 hours as needed - Prednisone 50 mg for 5 days - Doxycycline 100 mg twice a day for 5 days - Continue home COPD medications including Symbicort, Spiriva - O2 supplementation to keep sats greater than 90% - Lasix 40 by mouth daily - Strict I and O's, daily weights - Last echo 2014 normal, repeat here. - Trend BMP, CBC.  Atrial fibrillation: patient not initially noted to be in A. fib on EKG. Some right ventricular hypertrophy on EKG, telemetry with sinus arrhythmia. PR appears normal on EKG. There are P waves present. Patient on anticoagulation with Coumadin. Rate controlled at this time, on metoprolol at home.  - Coumadin per pharmacy - Monitor on telemetry - Metoprolol at home dose - Will get echo in the morning due to  history of ischemic cardiomyopathy, vascular congestion, concern for some component of CHF exacerbation  - Getting TSH  CAD/ischemic heart disease: Patient with known history of coronary artery disease. LHC 4/12: OM1 90-95% occluded, mid AV circumflex occluded with right to left collaterals, RCA 20-30% => b. PCI: Bare-metal stent to CFX. Troponin here was negative, EKG without acute changes concerning for ACS, patient has no chest pain. Patient chronically anticoagulated with Coumadin, INR greater than 4 on admission. - Anticoagulation with Coumadin as above.  - Rate control with metoprolol  - Telemetry monitoring.  - We'll get A1c - Known CAD and no statin on board. Apparently was discontinued at last hospitalization due to elevated LFT's, but apparently not restarted. Was on Pravastatin before. Will assess ability to place him on high dose therapy given his risk.   Hypertension - normotensive here - Continue home metoprolol 25 twice a day  CKDIII - patient with known chronic kidney disease. Creatinine slightly up and GFR slightly down from prior admissions. - Continue to trend BMP - Avoiding nephrotoxic meds, renally dose all necessary medications.  Hyperlipidemia - last lipid panel 2011, statin stopped due to elevated liver enzymes and never restarted at last hospitalization. Patient was supposed to follow up with his PCP where they would address restarting statin. Appears patient may never have followed up.  - Get lipid panel here  - Will get CMP for LFTs in the morning  - Will start high-dose statin therapy empirically at this time.   FEN/GI: Heart healthy diet Prophylaxis: On Coumadin  Disposition: Pending respiratory improvement, home with home health.  History of Present Illness: Keith Larson is a 81 y.o. male presenting with shortness of breath that has been worsening over the past month. She denies recent illness, fevers, chills, productive cough. Denies chest pain, lower  extremity swelling, worsening orthopnea. Patient says that he has decreased exercise tolerance. 3 L of oxygen at baseline at home. He has not had a worsening cough. He continues to smoke daily. He is in control of taking his own medications, does not know the names of them but says that he takes them daily. When prompted with names of medications he recognized them. Additionally, he lives alone and is able to get around his house somewhat okay. He cooks for himself, says that he normally eats beans and potatoes. He has a friend who comes to his house to help with housework as well.  In the ED, he is, have stable vital signs though somewhat hypoxic in spite of his normal 3 L of oxygen. He required 5 L of O2 to maintain his sats greater than 90%. Chest x-ray with some vascular congestion, but no overt pulmonary edema. BNP elevated. Wheezing to exam suggesting more of a COPD process than CHF process.  Review Of Systems: Per HPI with the following additions: None Otherwise 12 point review of systems was performed and was unremarkable.  Patient Active Problem List   Diagnosis Date Noted  . COPD with acute exacerbation 02/10/2015  . Acute respiratory failure with hypoxia 03/05/2014  . CKD (chronic kidney disease) stage 3, GFR 30-59 ml/min 03/05/2014  . Acute diastolic CHF (congestive heart failure) 03/05/2014  . Respiratory failure with hypoxia 03/05/2014  . HTN (hypertension) 03/05/2014  . Dyslipidemia 03/05/2014  . COPD exacerbation 02/05/2013  . Cardiomyopathy, nonischemic 04/05/2011  . Coronary artery disease status post bare-metal stenting of the acute marginal 04/05/2011  . ACE inhibitor intolerance   . Atrial fibrillation    Past Medical History: Past Medical History  Diagnosis Date  . Atrial fibrillation     Detected on an event recorder  . Coronary artery disease     a. LHC 4/12: OM1 90-95%, mid AV circumflex occluded with right to left collaterals, RCA 20-30% => b. PCI: BMS to CFX;   c. Echo 12/11: EF 40-45%, inferior HK. ;  d.  [name] Myoview 4/14:  EF 43%, large inferior and lateral scar, no ischemia, unchanged from 11/15/10; low risk  . Transient ischemic attack   . Ischemic cardiomyopathy     a. EF 40-45% by echo 2012 with inf WMA;  b. Echo 3/14: Mild LVH, EF 55-60%.  . Emphysema   . PVC (premature ventricular contraction)   . Hypertension   . ACE inhibitor intolerance     Cough  . Unspecified diastolic heart failure   . CKD (chronic kidney disease)   . HLD (hyperlipidemia)   . Shortness of breath   . On home O2     2L N/C continuous   Past Surgical History: Past Surgical History  Procedure Laterality Date  . Cholecystectomy    . Tonsillectomy     Social History: History  Substance Use Topics  . Smoking status: Current Every Day Smoker -- 1.00 packs/day for 50 years    Last Attempt to Quit: 02/12/2004  . Smokeless tobacco: Never Used  . Alcohol Use: Yes     Comment: several alcoholic beverages a week   Additional social history: None  Please also refer to relevant sections of EMR.  Family History: Family History  Problem Relation Age of  Onset  . Diabetes Mother   . Heart disease Neg Hx   . Heart attack Neg Hx    Allergies and Medications: No Known Allergies No current facility-administered medications on file prior to encounter.   Current Outpatient Prescriptions on File Prior to Encounter  Medication Sig Dispense Refill  . albuterol (PROVENTIL HFA;VENTOLIN HFA) 108 (90 BASE) MCG/ACT inhaler Inhale 2 puffs into the lungs 2 (two) times daily. 1 Inhaler 0  . furosemide (LASIX) 20 MG tablet Take 1 tablet (20 mg total) by mouth daily as needed for edema. 30 tablet 0  . guaiFENesin (ROBITUSSIN) 100 MG/5ML liquid Take 200 mg by mouth 4 (four) times daily as needed for cough.    . metoprolol (LOPRESSOR) 50 MG tablet Take 25 mg by mouth 2 (two) times daily.    . predniSONE (DELTASONE) 10 MG tablet Take 4 tablets (40 mg total) by mouth daily with  breakfast. 11 tablet 0  . SYMBICORT 160-4.5 MCG/ACT inhaler Inhale 1 puff into the lungs 2 (two) times daily.    [name] tiotropium (SPIRIVA) 18 MCG inhalation capsule Place 1 capsule (18 mcg total) into inhaler and inhale daily. 30 capsule 0  . warfarin (COUMADIN) 2 MG tablet Take 4-6 mg by mouth every evening. Take  on Mon / Fri ONLY All other days take   Including Sunday      Objective: BP 131/62 mmHg  Pulse 95  Temp(Src) 97.4 F (36.3 C) (Oral)  Resp 19  SpO2 95% Exam: General: NAD, AAOx3 HEENT: NCAT, PERRLA, EOMI, MMM Cardiovascular: Regular rate, irregular rhythm, No MGR 2+ distal pulses Respiratory: Diffuse biphasic wheezes throughout, Slight crackles in right lower base, but otherwise somewhat poor air movement. Appropriate rate, unlabored. [place] in place  Abdomen: S, NT, ND, +BS Extremities: WWP, 2+ distal pulses, No edema noted.  Skin: No lesions no rashes  Neuro: AAOx3, No focal deficits at this time.   Labs and Imaging: CBC BMET   Recent Labs Lab 02/10/15 1742  WBC 5.8  HGB 10.1*  HCT 34.3*  PLT 175    Recent Labs Lab 02/10/15 1742  NA 138  K 5.4*  CL 103  CO2 25  BUN 40*  CREATININE 2.29*  GLUCOSE 99  CALCIUM 8.7     Troponin- 0.01  BNP- 342.1  PT- 40.7 INR- 4.18  EKG- normal sinus rhythm, consistent PR, right atrial enlargement.  CXR: IMPRESSION: Mild vascular congestion noted, without definite pulmonary edema. Findings of COPD.   Yolande Jolly, MD 02/10/2015, 11:08 PM PGY-1, [facility] FPTS Intern pager: [phone], text pages welcome  I have seen and evaluated the above patient.  I agree with the note.   [place] Other DO Family Medicine PGY-3 Pager: [phone]

## 2015-02-10 NOTE — ED Notes (Signed)
Visitors at bedside states pt son (colon) is healthcare POA. Silvio Clayman is pt home CNA and would like to be updated on patient care if possible.  [phone] *pt verbalized consent to give info to these persons.

## 2015-02-10 NOTE — ED Provider Notes (Signed)
CSN: [id number]     Arrival date & time 02/10/15  1706 History   First MD Initiated Contact with Patient 02/10/15 1757     Chief Complaint  Patient presents with  . Shortness of Breath      HPI Pt was seen at 1810.  Per pt and his family, c/o gradual onset and worsening of persistent cough, wheezing and SOB for the past 1 week. States he has "turned up my oxygen" to 3L N/C (from his baseline 2L N/C) over the past week due to his symptoms. Has been using home MDI and nebs with transient relief. Pt's symptoms worsen with exertion. Pt was evaluated by his PMD at the [place] today and was told "to go to the nearest hospital to get admitted because my lungs are shutting down." Denies CP/palpitations, no back pain, no abd pain, no N/V/D, no fevers, no rash.     Past Medical History  Diagnosis Date  . Atrial fibrillation     Detected on an event recorder  . Coronary artery disease     a. LHC 4/12: OM1 90-95%, mid AV circumflex occluded with right to left collaterals, RCA 20-30% => b. PCI: BMS to CFX;  c. Echo 12/11: EF 40-45%, inferior HK. ;  d.  [name] Myoview 4/14:  EF 43%, large inferior and lateral scar, no ischemia, unchanged from 11/15/10; low risk  . Transient ischemic attack   . Ischemic cardiomyopathy     a. EF 40-45% by echo 2012 with inf WMA;  b. Echo 3/14: Mild LVH, EF 55-60%.  . Emphysema   . PVC (premature ventricular contraction)   . Hypertension   . ACE inhibitor intolerance     Cough  . Unspecified diastolic heart failure   . CKD (chronic kidney disease)   . HLD (hyperlipidemia)   . Shortness of breath   . On home O2     2L N/C continuous   Past Surgical History  Procedure Laterality Date  . Cholecystectomy    . Tonsillectomy     Family History  Problem Relation Age of Onset  . Diabetes Mother   . Heart disease Neg Hx   . Heart attack Neg Hx    History  Substance Use Topics  . Smoking status: Current Every Day Smoker -- 1.00 packs/day for 50 years    Last  Attempt to Quit: 02/12/2004  . Smokeless tobacco: Never Used  . Alcohol Use: Yes     Comment: several alcoholic beverages a week    Review of Systems ROS: Statement: All systems negative except as marked or noted in the HPI; Constitutional: Negative for fever and chills. ; ; Eyes: Negative for eye pain, redness and discharge. ; ; ENMT: Negative for ear pain, hoarseness, nasal congestion, sinus pressure and sore throat. ; ; Cardiovascular: Negative for chest pain, palpitations, diaphoresis, and peripheral edema. ; ; Respiratory: +cough, wheezing, SOB. Negative for stridor. ; ; Gastrointestinal: Negative for nausea, vomiting, diarrhea, abdominal pain, blood in stool, hematemesis, jaundice and rectal bleeding. . ; ; Genitourinary: Negative for dysuria, flank pain and hematuria. ; ; Musculoskeletal: Negative for back pain and neck pain. Negative for swelling and trauma.; ; Skin: Negative for pruritus, rash, abrasions, blisters, bruising and skin lesion.; ; Neuro: Negative for headache, lightheadedness and neck stiffness. Negative for weakness, altered level of consciousness , altered mental status, extremity weakness, paresthesias, involuntary movement, seizure and syncope.       Allergies  Review of patient's allergies indicates no known allergies.  Home Medications   Prior to Admission medications   Medication Sig Start Date End Date Taking? Authorizing Provider  albuterol (PROVENTIL HFA;VENTOLIN HFA) 108 (90 BASE) MCG/ACT inhaler Inhale 2 puffs into the lungs 2 (two) times daily. 02/11/13  Yes [name] Luanne Bras, MD  furosemide (LASIX) 20 MG tablet Take 1 tablet (20 mg total) by mouth daily as needed for edema. 03/09/14  Yes Calvert Cantor, MD  guaiFENesin (ROBITUSSIN) 100 MG/5ML liquid Take 200 mg by mouth 4 (four) times daily as needed for cough.   Yes Historical Provider, MD  metoprolol (LOPRESSOR) 50 MG tablet Take 25 mg by mouth 2 (two) times daily.   Yes Historical Provider, MD  predniSONE  (DELTASONE) 10 MG tablet Take 4 tablets (40 mg total) by mouth daily with breakfast. 03/09/14  Yes Calvert Cantor, MD  SYMBICORT 160-4.5 MCG/ACT inhaler Inhale 1 puff into the lungs 2 (two) times daily. 09/11/14  Yes Historical Provider, MD  tiotropium (SPIRIVA) 18 MCG inhalation capsule Place 1 capsule (18 mcg total) into inhaler and inhale daily. 02/11/13  Yes [name] Luanne Bras, MD  warfarin (COUMADIN) 2 MG tablet Take 4-6 mg by mouth every evening. Take 6mg  on Mon / Fri ONLY All other days take 4mg   Including Sunday   Yes Historical Provider, MD   BP 105/43 mmHg  Pulse 77  Temp(Src) 97.4 F (36.3 C) (Oral)  Resp 18  SpO2 96% Physical Exam  1815; Physical examination:  Nursing notes reviewed; Vital signs and O2 SAT reviewed;  Constitutional: Well developed, Well nourished, Well hydrated, Uncomfortable appearing.; Head:  Normocephalic, atraumatic; Eyes: EOMI, PERRL, No scleral icterus; ENMT: Mouth and pharynx normal, Mucous membranes moist; Neck: Supple, Full range of motion, No lymphadenopathy; Cardiovascular: Regular rate and rhythm, No gallop; Respiratory: Breath sounds diminished & equal bilaterally, insp/exp wheezes. No audible wheezing.  Speaking short sentences, tachypneic, sitting upright.; Chest: Nontender, Movement normal; Abdomen: Soft, Nontender, Nondistended, Normal bowel sounds; Genitourinary: No CVA tenderness; Extremities: Pulses normal, No tenderness, No edema, No calf edema or asymmetry.; Neuro: AA&Ox3, Major CN grossly intact.  Speech clear. No gross focal motor or sensory deficits in extremities.; Skin: Color normal, Warm, Dry.   ED Course  Procedures     EKG Interpretation   Date/Time:  Tuesday February 10 2015 17:15:34 EDT Ventricular Rate:  74 PR Interval:  166 QRS Duration: 96 QT Interval:  378 QTC Calculation: 419 R Axis:   97 Text Interpretation:  Normal sinus rhythm Anterolateral infarct , age  undetermined Artifact When compared with ECG of 02/11/2013 No significant   change was found Confirmed by [facility]  MD, Nicholos Johns [phone]) on 02/10/2015  6:16:31 PM      MDM  MDM Reviewed: previous chart, nursing note and vitals Reviewed previous: labs and ECG Interpretation: labs, x-ray and ECG Total time providing critical care: 30-74 minutes. This excludes time spent performing separately reportable procedures and services. Consults: admitting MD   CRITICAL CARE Performed by: Laray Anger Total critical care time: 35 Critical care time was exclusive of separately billable procedures and treating other patients. Critical care was necessary to treat or prevent imminent or life-threatening deterioration. Critical care was time spent personally by me on the following activities: development of treatment plan with patient and/or surrogate as well as nursing, discussions with consultants, evaluation of patient's response to treatment, examination of patient, obtaining history from patient or surrogate, ordering and performing treatments and interventions, ordering and review of laboratory studies, ordering and review of radiographic studies, pulse oximetry and re-evaluation  of patient's condition.   Results for orders placed or performed during the hospital encounter of 02/10/15  Basic metabolic panel  Result Value Ref Range   Sodium 138 135 - 145 mmol/L   Potassium 5.4 (H) 3.5 - 5.1 mmol/L   Chloride 103 96 - 112 mmol/L   CO2 25 19 - 32 mmol/L   Glucose, Bld 99 70 - 99 mg/dL   BUN 40 (H) 6 - 23 mg/dL   Creatinine, Ser 1.61 (H) 0.50 - 1.35 mg/dL   Calcium 8.7 8.4 - 09.6 mg/dL   GFR calc non Af Amer 25 (L) >90 mL/min   GFR calc Af Amer 29 (L) >90 mL/min   Anion gap 10 5 - 15  BNP (order ONLY if patient complains of dyspnea/SOB AND you have documented it for THIS visit)  Result Value Ref Range   B Natriuretic Peptide 342.1 (H) 0.0 - 100.0 pg/mL  CBC with Differential  Result Value Ref Range   WBC 5.8 4.0 - 10.5 K/uL   RBC 3.64 (L) 4.22 - 5.81 MIL/uL    Hemoglobin 10.1 (L) 13.0 - 17.0 g/dL   HCT 04.5 (L) 40.9 - 81.1 %   MCV 94.2 78.0 - 100.0 fL   MCH 27.7 26.0 - 34.0 pg   MCHC 29.4 (L) 30.0 - 36.0 g/dL   RDW 91.4 78.2 - 95.6 %   Platelets 175 150 - 400 K/uL   Neutrophils Relative % 68 43 - 77 %   Neutro Abs 3.9 1.7 - 7.7 K/uL   Lymphocytes Relative 18 12 - 46 %   Lymphs Abs 1.0 0.7 - 4.0 K/uL   Monocytes Relative 9 3 - 12 %   Monocytes Absolute 0.5 0.1 - 1.0 K/uL   Eosinophils Relative 4 0 - 5 %   Eosinophils Absolute 0.3 0.0 - 0.7 K/uL   Basophils Relative 1 0 - 1 %   Basophils Absolute 0.0 0.0 - 0.1 K/uL  I-stat troponin, ED (not at [facility])  Result Value Ref Range   Troponin i, poc 0.01 0.00 - 0.08 ng/mL   Comment 3           Dg Chest 2 View 02/10/2015   CLINICAL DATA:  Acute onset of shortness of breath. Initial encounter.  EXAM: CHEST  2 VIEW  COMPARISON:  Chest radiograph performed 09/13/2014  FINDINGS: The lungs are hyperexpanded, with flattening of the hemidiaphragms, compatible with COPD. Mild vascular congestion is noted. There is no evidence of focal opacification, pleural effusion or pneumothorax.  The heart is borderline normal in size. No acute osseous abnormalities are seen.  IMPRESSION: Mild vascular congestion noted, without definite pulmonary edema. Findings of COPD.   Electronically Signed   By: Roanna Raider M.D.   On: 02/10/2015 18:40    2045:  On arrival: pt sitting upright, tachypneic, wheezing; hour long neb and IV solumedrol given. Neb completed: pt ambulated with Sats dropping to 83% despite wearing his usual O2 N/C.  Dx and testing d/w pt and family.  Questions answered.  Verb understanding, agreeable to admit. T/C to [facility] Resident, case discussed, including:  HPI, pertinent PM/SHx, VS/PE, dx testing, ED course and treatment:  Agreeable to admit, requests they will come to the ED for evaluation.  2140:  [facility] resident called: requests to put in temp orders, tele bed, Dr. Versie Starks service.  Samuel Jester,  DO 02/13/15 1258

## 2015-02-11 DIAGNOSIS — E785 Hyperlipidemia, unspecified: Secondary | ICD-10-CM

## 2015-02-11 DIAGNOSIS — J441 Chronic obstructive pulmonary disease with (acute) exacerbation: Principal | ICD-10-CM

## 2015-02-11 DIAGNOSIS — N183 Chronic kidney disease, stage 3 unspecified: Secondary | ICD-10-CM | POA: Insufficient documentation

## 2015-02-11 DIAGNOSIS — I2510 Atherosclerotic heart disease of native coronary artery without angina pectoris: Secondary | ICD-10-CM | POA: Insufficient documentation

## 2015-02-11 DIAGNOSIS — I10 Essential (primary) hypertension: Secondary | ICD-10-CM | POA: Insufficient documentation

## 2015-02-11 DIAGNOSIS — I255 Ischemic cardiomyopathy: Secondary | ICD-10-CM

## 2015-02-11 DIAGNOSIS — I2589 Other forms of chronic ischemic heart disease: Secondary | ICD-10-CM

## 2015-02-11 LAB — GLUCOSE, CAPILLARY
GLUCOSE-CAPILLARY: 141 mg/dL — AB (ref 70–99)
GLUCOSE-CAPILLARY: 203 mg/dL — AB (ref 70–99)
GLUCOSE-CAPILLARY: 165 mg/dL — AB (ref 70–99)
Glucose-Capillary: 149 mg/dL — ABNORMAL HIGH (ref 70–99)
Glucose-Capillary: 152 mg/dL — ABNORMAL HIGH (ref 70–99)

## 2015-02-11 LAB — CBC
HEMATOCRIT: 31.5 % — AB (ref 39.0–52.0)
Hemoglobin: 9.4 g/dL — ABNORMAL LOW (ref 13.0–17.0)
MCH: 28.1 pg (ref 26.0–34.0)
MCHC: 29.8 g/dL — ABNORMAL LOW (ref 30.0–36.0)
MCV: 94 fL (ref 78.0–100.0)
Platelets: 180 10*3/uL (ref 150–400)
RBC: 3.35 MIL/uL — AB (ref 4.22–5.81)
RDW: 14.8 % (ref 11.5–15.5)
WBC: 3.6 10*3/uL — AB (ref 4.0–10.5)

## 2015-02-11 LAB — COMPREHENSIVE METABOLIC PANEL
ALBUMIN: 3.1 g/dL — AB (ref 3.5–5.2)
ALT: 17 U/L (ref 0–53)
ANION GAP: 6 (ref 5–15)
AST: 16 U/L (ref 0–37)
Alkaline Phosphatase: 43 U/L (ref 39–117)
BILIRUBIN TOTAL: 0.5 mg/dL (ref 0.3–1.2)
BUN: 44 mg/dL — AB (ref 6–23)
CALCIUM: 8.6 mg/dL (ref 8.4–10.5)
CHLORIDE: 106 mmol/L (ref 96–112)
CO2: 25 mmol/L (ref 19–32)
CREATININE: 2.43 mg/dL — AB (ref 0.50–1.35)
GFR calc Af Amer: 27 mL/min — ABNORMAL LOW (ref >90)
GFR calc non Af Amer: 23 mL/min — ABNORMAL LOW (ref >90)
Glucose, Bld: 153 mg/dL — ABNORMAL HIGH (ref 70–99)
Potassium: 5.7 mmol/L — ABNORMAL HIGH (ref 3.5–5.1)
Sodium: 137 mmol/L (ref 135–145)
TOTAL PROTEIN: 5.4 g/dL — AB (ref 6.0–8.3)

## 2015-02-11 LAB — RETICULOCYTES
RBC.: 3.47 MIL/uL — AB (ref 4.22–5.81)
RETIC CT PCT: 1.3 % (ref 0.4–3.1)
Retic Count, Absolute: 45.1 10*3/uL (ref 19.0–186.0)

## 2015-02-11 LAB — LIPID PANEL
Cholesterol: 109 mg/dL (ref 0–200)
HDL: 38 mg/dL — AB (ref >39)
LDL CALC: 60 mg/dL (ref 0–99)
Total CHOL/HDL Ratio: 2.9 RATIO
Triglycerides: 57 mg/dL (ref <150)
VLDL: 11 mg/dL (ref 0–40)

## 2015-02-11 LAB — PROTIME-INR
INR: 4.49 — ABNORMAL HIGH (ref 0.00–1.49)
Prothrombin Time: 43 seconds — ABNORMAL HIGH (ref 11.6–15.2)

## 2015-02-11 LAB — TSH: TSH: 1.177 u[IU]/mL (ref 0.350–4.500)

## 2015-02-11 MED ORDER — ATORVASTATIN CALCIUM 40 MG PO TABS
40.0000 mg | ORAL_TABLET | Freq: Every day | ORAL | Status: DC
  Administered 2015-02-11: 40 mg via ORAL
  Filled 2015-02-11 (×2): qty 1

## 2015-02-11 MED ORDER — BENZONATATE 100 MG PO CAPS
100.0000 mg | ORAL_CAPSULE | Freq: Three times a day (TID) | ORAL | Status: DC | PRN
  Filled 2015-02-11: qty 1

## 2015-02-11 MED ORDER — IPRATROPIUM-ALBUTEROL 0.5-2.5 (3) MG/3ML IN SOLN: 3.0000 mL | Freq: Four times a day (QID) | RESPIRATORY_TRACT | Status: DC | PRN

## 2015-02-11 MED ORDER — INSULIN ASPART 100 UNIT/ML ~~LOC~~ SOLN
0.0000 [IU] | Freq: Three times a day (TID) | SUBCUTANEOUS | Status: DC
  Administered 2015-02-11: 2 [IU] via SUBCUTANEOUS
  Administered 2015-02-11: 3 [IU] via SUBCUTANEOUS
  Administered 2015-02-12: 2 [IU] via SUBCUTANEOUS
  Administered 2015-02-13 (×2): 1 [IU] via SUBCUTANEOUS

## 2015-02-11 MED ORDER — IPRATROPIUM-ALBUTEROL 0.5-2.5 (3) MG/3ML IN SOLN
3.0000 mL | Freq: Three times a day (TID) | RESPIRATORY_TRACT | Status: DC
  Administered 2015-02-11: 3 mL via RESPIRATORY_TRACT
  Filled 2015-02-11: qty 3

## 2015-02-11 MED ORDER — SODIUM POLYSTYRENE SULFONATE 15 GM/60ML PO SUSP
15.0000 g | Freq: Once | ORAL | Status: AC
  Administered 2015-02-11: 15 g via ORAL
  Filled 2015-02-11: qty 60

## 2015-02-11 NOTE — Progress Notes (Signed)
Utilization review completed.  

## 2015-02-11 NOTE — Progress Notes (Signed)
Family Medicine Teaching Service Daily Progress Note Intern Pager: [phone]  [name] Medical record number: [id number] Date of birth: Nov 06, 1932 Age: 81 y.o. Gender: male  Primary Care Provider: Pcp Not In System Consultants: None Code Status: Full  Pt Overview and Major Events to Date:  3/15: Admitted for worsening dyspnea  Assessment and Plan: Keith Larson is a 81 y.o. male presenting with worsening shortness of breath for one month . PMH is significant for COPD, atrial fibrillation, cardiomyopathy, coronary artery disease status post stent of the AM, chronic hypoxic respiratory failure, hypertension, dyslipidemia, CKDIII.   COPD vs CHF exacerbation: Patient here with increasing shortness of breath for the past month. No known viral prodrome, no productive cough, no fevers, baseline O2 requirement of 3 L. Afebrile here, vital signs stable, white count 5.8. Wheezes diffusely on exam. Increase in oxygen requirement to 5 L to maintain saturations 90%. Elevated BNP on admission to 342. Some congestion on chest x-ray. No lower extremity edema. No clear baseline weight. No orthopnea. - telemtry  - Duoneb q6hrs prn - Albuterol every 2 hours as needed - Prednisone 50 mg for 5 days - Doxycycline 100 mg twice a day for 5 days - Continue home COPD medications including Symbicort, Spiriva - O2 supplementation to keep sats greater than 90% - Lasix 40 by mouth daily - Strict I and O's, daily weights - Echo pending - Last echo in 2014 unremarkable  Atrial fibrillation: Some right ventricular hypertrophy on EKG, telemetry with sinus arrhythmia. PR appears normal on EKG. There are P waves present. Patient on anticoagulation with Coumadin. Rate controlled at this time, on metoprolol at home. Supratherapuetic INR - Coumadin per pharmacy - Monitor on telemetry - Metoprolol at home dose - Echo pending - TSH wnl  CAD/ischemic heart disease: Patient with known history of coronary  artery disease. LHC 4/12: OM1 90-95% occluded, mid AV circumflex occluded with right to left collaterals, RCA 20-30% => b. PCI: Bare-metal stent to CFX. Troponin here was negative, EKG without acute changes concerning for ACS, patient has no chest pain. Patient chronically anticoagulated with Coumadin, INR greater than 4 on admission. - Anticoagulation with Coumadin as above.  - Rate control with metoprolol  - Telemetry monitoring.  - A1c pending - Known CAD and no statin on board at admission.  - monitor LFTs   Normocyteic anemia: Hbg down trending. Baseline Hbg 12. Patient states last colonoscopy 2 years ago. Denies any bleeding - Hbg 9.4 today - FOBT ordered - anemia panel ordered - trend CBC  Elevated blood sugars: Likely due to prednisone. No diagnosis of diabetes -A1c ordered -sensitive SSI  Hypertension - normotensive here - Continue home metoprolol 25 twice a day  CKDIII with AKI- patient with known chronic kidney disease. Creatinine slightly up and GFR slightly down from prior admissions.  - Cr elevated from admission 2.29> 2.43 - hyperkalemic -> giving Kayexalate but also getting insulin and lasix - Continue to trend BMP - Avoiding nephrotoxic meds, renally dose all necessary medications.  Hyperlipidemia - last lipid panel 2011, statin stopped due to elevated liver enzymes and never restarted at last hospitalization. Patient was supposed to follow up with his PCP where they would address restarting statin. Appears patient may never have followed up.  - lipid panel pending - LFTS appropriate - Continue high-dose statin therapy empirically   FEN/GI: Heart healthy diet Prophylaxis: On Coumadin  Disposition: Continue current management as above; pending respiratory improvement. Possible d/c tmrw.  Subjective:  Patient doing well this morning. States his respiratory status has improved. He still is having a wet cough and unable to produce any mucous. He is back on his  home amount of oxygen and saturating well.  Objective: Temp:  [97.4 F (36.3 C)-97.9 F (36.6 C)] 97.5 F (36.4 C) (03/16 0338) Pulse Rate:  [77-103] 96 (03/16 0338) Resp:  [16-20] 18 (03/16 0338) BP: (105-137)/(43-62) 136/59 mmHg (03/16 0338) SpO2:  [90 %-100 %] 92 % (03/16 0338) Weight:  [147 lb 11.3 oz (67 kg)] 147 lb 11.3 oz (67 kg) (03/16 0015) Physical Exam: General: NAD, AAOx3 HEENT: NCAT, EOMI, MMM Cardiovascular: Regular rate, irregular rhythm, No MGR 2+ distal pulses Respiratory: Diffuse biphasic wheezes throughout, but otherwise somewhat poor air movement. Appropriate rate, unlabored. [place] in place  Abdomen: S, NT, ND, +BS Extremities: WWP, 2+ distal pulses, No edema noted.  Skin: No lesions no rashes  Neuro: AAOx3, No focal deficits at this time.   Laboratory: Results for orders placed or performed during the hospital encounter of 02/10/15 (from the past 24 hour(s))  Basic metabolic panel     Status: Abnormal   Collection Time: 02/10/15  5:42 PM  Result Value Ref Range   Sodium 138 135 - 145 mmol/L   Potassium 5.4 (H) 3.5 - 5.1 mmol/L   Chloride 103 96 - 112 mmol/L   CO2 25 19 - 32 mmol/L   Glucose, Bld 99 70 - 99 mg/dL   BUN 40 (H) 6 - 23 mg/dL   Creatinine, Ser 1.61 (H) 0.50 - 1.35 mg/dL   Calcium 8.7 8.4 - 09.6 mg/dL   GFR calc non Af Amer 25 (L) >90 mL/min   GFR calc Af Amer 29 (L) >90 mL/min   Anion gap 10 5 - 15  BNP (order ONLY if patient complains of dyspnea/SOB AND you have documented it for THIS visit)     Status: Abnormal   Collection Time: 02/10/15  5:42 PM  Result Value Ref Range   B Natriuretic Peptide 342.1 (H) 0.0 - 100.0 pg/mL  CBC with Differential     Status: Abnormal   Collection Time: 02/10/15  5:42 PM  Result Value Ref Range   WBC 5.8 4.0 - 10.5 K/uL   RBC 3.64 (L) 4.22 - 5.81 MIL/uL   Hemoglobin 10.1 (L) 13.0 - 17.0 g/dL   HCT 04.5 (L) 40.9 - 81.1 %   MCV 94.2 78.0 - 100.0 fL   MCH 27.7 26.0 - 34.0 pg   MCHC 29.4 (L) 30.0 - 36.0  g/dL   RDW 91.4 78.2 - 95.6 %   Platelets 175 150 - 400 K/uL   Neutrophils Relative % 68 43 - 77 %   Neutro Abs 3.9 1.7 - 7.7 K/uL   Lymphocytes Relative 18 12 - 46 %   Lymphs Abs 1.0 0.7 - 4.0 K/uL   Monocytes Relative 9 3 - 12 %   Monocytes Absolute 0.5 0.1 - 1.0 K/uL   Eosinophils Relative 4 0 - 5 %   Eosinophils Absolute 0.3 0.0 - 0.7 K/uL   Basophils Relative 1 0 - 1 %   Basophils Absolute 0.0 0.0 - 0.1 K/uL  Protime-INR     Status: Abnormal   Collection Time: 02/10/15  5:42 PM  Result Value Ref Range   Prothrombin Time 40.7 (H) 11.6 - 15.2 seconds   INR 4.18 (H) 0.00 - 1.49  I-stat troponin, ED (not at [facility])     Status: None   Collection Time: 02/10/15  6:13 PM  Result Value Ref Range   Troponin i, poc 0.01 0.00 - 0.08 ng/mL   Comment 3          TSH     Status: None   Collection Time: 02/11/15 12:59 AM  Result Value Ref Range   TSH 1.177 0.350 - 4.500 uIU/mL  Glucose, capillary     Status: Abnormal   Collection Time: 02/11/15  1:22 AM  Result Value Ref Range   Glucose-Capillary 141 (H) 70 - 99 mg/dL  CBC     Status: Abnormal   Collection Time: 02/11/15  4:43 AM  Result Value Ref Range   WBC 3.6 (L) 4.0 - 10.5 K/uL   RBC 3.35 (L) 4.22 - 5.81 MIL/uL   Hemoglobin 9.4 (L) 13.0 - 17.0 g/dL   HCT 16.1 (L) 09.6 - 04.5 %   MCV 94.0 78.0 - 100.0 fL   MCH 28.1 26.0 - 34.0 pg   MCHC 29.8 (L) 30.0 - 36.0 g/dL   RDW 40.9 81.1 - 91.4 %   Platelets 180 150 - 400 K/uL  Protime-INR     Status: Abnormal   Collection Time: 02/11/15  4:43 AM  Result Value Ref Range   Prothrombin Time 43.0 (H) 11.6 - 15.2 seconds   INR 4.49 (H) 0.00 - 1.49  Comprehensive metabolic panel     Status: Abnormal   Collection Time: 02/11/15  4:43 AM  Result Value Ref Range   Sodium 137 135 - 145 mmol/L   Potassium 5.7 (H) 3.5 - 5.1 mmol/L   Chloride 106 96 - 112 mmol/L   CO2 25 19 - 32 mmol/L   Glucose, Bld 153 (H) 70 - 99 mg/dL   BUN 44 (H) 6 - 23 mg/dL   Creatinine, Ser 7.82 (H) 0.50 - 1.35 mg/dL    Calcium 8.6 8.4 - 95.6 mg/dL   Total Protein 5.4 (L) 6.0 - 8.3 g/dL   Albumin 3.1 (L) 3.5 - 5.2 g/dL   AST 16 0 - 37 U/L   ALT 17 0 - 53 U/L   Alkaline Phosphatase 43 39 - 117 U/L   Total Bilirubin 0.5 0.3 - 1.2 mg/dL   GFR calc non Af Amer 23 (L) >90 mL/min   GFR calc Af Amer 27 (L) >90 mL/min   Anion gap 6 5 - 15  Glucose, capillary     Status: Abnormal   Collection Time: 02/11/15  6:24 AM  Result Value Ref Range   Glucose-Capillary 149 (H) 70 - 99 mg/dL    Imaging/Diagnostic Tests: Dg Chest 2 View  02/10/2015   CLINICAL DATA:  Acute onset of shortness of breath. Initial encounter.  EXAM: CHEST  2 VIEW  COMPARISON:  Chest radiograph performed 09/13/2014  FINDINGS: The lungs are hyperexpanded, with flattening of the hemidiaphragms, compatible with COPD. Mild vascular congestion is noted. There is no evidence of focal opacification, pleural effusion or pneumothorax.  The heart is borderline normal in size. No acute osseous abnormalities are seen.  IMPRESSION: Mild vascular congestion noted, without definite pulmonary edema. Findings of COPD.   Electronically Signed   By: Roanna Raider M.D.   On: 02/10/2015 18:40    Pincus Large, DO 02/11/2015, 7:35 AM PGY-1, [facility] FPTS Intern pager: [phone], text pages welcome

## 2015-02-11 NOTE — Progress Notes (Signed)
INITIAL NUTRITION ASSESSMENT  DOCUMENTATION CODES Per approved criteria  -Severe malnutrition in the context of chronic illness   INTERVENTION: Ensure Enlive po BID, each supplement provides 350 kcal and 20 grams of protein  NUTRITION DIAGNOSIS: Malnutrition related to chronic illness as evidenced by severe fat and muscle depletion and intake of </= 75% of his needs for >/= 1 month.   Goal: Pt to meet >/= 90% of their estimated nutrition needs   Monitor:  PO intake, supplement acceptance  Reason for Assessment: MD consult  81 y.o. male  ASSESSMENT: Pt with hx of CPOD admitted with worsening SOB.  Per pt he cannot sit up, move around, etc without becoming SOB.  Pt lives alone brother loves to cook and brings him food daily. Per pt his appetite is good but he has continued to lose weight since being sick last year in March. Pt has lost overall 11% of his weight in the last year.  24 hr recall reveals pt is eating the same but suspect due to increased kcal needs pt is not able to meet his needs to prevent weight loss.  Breakfast cereal or egg with gravy Lunch whole sandwich Dinner beef stew or chicken and pastry, etc.  Pt willing to try ensure.   Nutrition Focused Physical Exam:  Subcutaneous Fat:  Orbital Region: mild/moderate depletion Upper Arm Region: severe depeltion Thoracic and Lumbar Region: WDL  Muscle:  Temple Region: severe depletion Clavicle Bone Region: severe depletion Clavicle and Acromion Bone Region: severe depletion Scapular Bone Region: mild/moderate depletion Dorsal Hand: mild/moderate depletion Patellar Region: mild/moderate depletion Anterior Thigh Region: mild/moderate depletion Posterior Calf Region: severe depletion  Edema: not present  Labs: Potassium and BUN/Cr elevated  Height: Ht Readings from Last 1 Encounters:  02/11/15 5\' 8"  (1.727 m)    Weight: Wt Readings from Last 1 Encounters:  02/11/15 147 lb 11.3 oz (67 kg)    Ideal  Body Weight: 70 kg  % Ideal Body Weight: 96%  Wt Readings from Last 10 Encounters:  02/11/15 147 lb 11.3 oz (67 kg)  09/13/14 176 lb 12.8 oz (80.196 kg)  03/09/14 160 lb 0.9 oz (72.6 kg)  04/18/13 168 lb 12.8 oz (76.567 kg)  03/20/13 157 lb (71.215 kg)  03/06/13 159 lb 12.8 oz (72.485 kg)  02/20/13 162 lb 12.8 oz (73.846 kg)  02/08/13 160 lb 15 oz (73 kg)  08/23/11 173 lb 1.9 oz (78.527 kg)  04/05/11 173 lb 1.9 oz (78.527 kg)    Usual Body Weight: 165 lb  % Usual Body Weight: 89%  BMI:  Body mass index is 22.46 kg/(m^2).  Estimated Nutritional Needs: Kcal: 2000-2200 Protein: 100-115 grams Fluid: > 2.0 L/day  Skin: WDL  Diet Order:    EDUCATION NEEDS: -No education needs identified at this time   Intake/Output Summary (Last 24 hours) at 02/11/15 1452 Last data filed at 02/11/15 1300  Gross per 24 hour  Intake    480 ml  Output    375 ml  Net    105 ml    Last BM: 3/16   Labs:   Recent Labs Lab 02/10/15 1742 02/11/15 0443  NA 138 137  K 5.4* 5.7*  CL 103 106  CO2 25 25  BUN 40* 44*  CREATININE 2.29* 2.43*  CALCIUM 8.7 8.6  GLUCOSE 99 153*    CBG (last 3)   Recent Labs  02/11/15 0122 02/11/15 0624 02/11/15 1149  GLUCAP 141* 149* 203*    Scheduled Meds: . atorvastatin  40 mg Oral q1800  . budesonide-formoterol  1 puff Inhalation BID  . doxycycline  100 mg Oral Q12H  . insulin aspart  0-9 Units Subcutaneous TID WC  . metoprolol  25 mg Oral BID  . predniSONE  50 mg Oral Q breakfast  . sodium chloride  3 mL Intravenous Q12H  . tiotropium  18 mcg Inhalation Daily    Continuous Infusions:   Past Medical History  Diagnosis Date  . Atrial fibrillation     Detected on an event recorder  . Coronary artery disease     a. LHC 4/12: OM1 90-95%, mid AV circumflex occluded with right to left collaterals, RCA 20-30% => b. PCI: BMS to CFX;  c. Echo 12/11: EF 40-45%, inferior HK. ;  d.  [name] Myoview 4/14:  EF 43%, large inferior and lateral  scar, no ischemia, unchanged from 11/15/10; low risk  . Transient ischemic attack   . Ischemic cardiomyopathy     a. EF 40-45% by echo 2012 with inf WMA;  b. Echo 3/14: Mild LVH, EF 55-60%.  . Emphysema   . PVC (premature ventricular contraction)   . Hypertension   . ACE inhibitor intolerance     Cough  . Unspecified diastolic heart failure   . CKD (chronic kidney disease)   . HLD (hyperlipidemia)   . Shortness of breath   . On home O2     2L N/C continuous    Past Surgical History  Procedure Laterality Date  . Cholecystectomy    . Tonsillectomy      Kendell Bane RD, LDN, CNSC [phone] Pager [phone] After Hours Pager

## 2015-02-11 NOTE — Progress Notes (Signed)
Admitted pt from ED via stretcher, pt alert and oriented, sleepy , verbalized that he did not have a good sleep for the past days and is exhausted, denied pain at this time, admission assessment done, orders carried out, family at bedside.

## 2015-02-11 NOTE — Evaluation (Signed)
Occupational Therapy Evaluation Patient Details Name: Keith Larson MRN: [id number] DOB: May 21, 1932 Today's Date: 02/11/2015    History of Present Illness  81 y.o. male presenting with shortness of breath that has been worsening over the past month. Patient says that he has decreased activity tolerance. 3 L of oxygen at baseline at home at night, 2L during the day. He continues to smoke daily.    Clinical Impression      Follow Up Recommendations  No OT follow up    Equipment Recommendations  None recommended by OT    Recommendations for Other Services       Precautions / Restrictions Precautions Precaution Comments: O2 sats drop with activity Restrictions Weight Bearing Restrictions: No      Mobility Bed Mobility Overal bed mobility: Modified Independent                Transfers Overall transfer level: Needs assistance Equipment used: Rolling walker (2 wheeled) Transfers: Sit to/from Stand Sit to Stand: Supervision         General transfer comment: cues for hand placement     Balance                                            ADL Overall ADL's : Needs assistance/impaired Eating/Feeding: Independent   Grooming: Wash/dry hands;Supervision/safety;Standing   Upper Body Bathing: Set up;Sitting   Lower Body Bathing: Supervison/ safety;Sit to/from stand   Upper Body Dressing : Set up;Sitting   Lower Body Dressing: Supervision/safety;Sit to/from stand   Toilet Transfer: Supervision/safety;Ambulation;RW;Regular Toilet;Grab bars   Toileting- Clothing Manipulation and Hygiene: Moderate assistance;Sit to/from stand       Functional mobility during ADLs: Supervision/safety;Rolling walker General ADL Comments: extended 02 tubing to reach bathroom     Vision     Perception     Praxis      Pertinent Vitals/Pain Pain Assessment: No/denies pain     Hand Dominance Right   Extremity/Trunk Assessment Upper Extremity  Assessment Upper Extremity Assessment: Overall WFL for tasks assessed   Lower Extremity Assessment Lower Extremity Assessment: Defer to PT evaluation   Cervical / Trunk Assessment Cervical / Trunk Assessment: Normal   Communication Communication Communication: HOH   Cognition Arousal/Alertness: Awake/alert Behavior During Therapy: WFL for tasks assessed/performed Overall Cognitive Status: Within Functional Limits for tasks assessed                     General Comments       Exercises       Shoulder Instructions      Home Living Family/patient expects to be discharged to:: Private residence Living Arrangements: Alone Available Help at Discharge: Friend(s);Available PRN/intermittently Type of Home: House Home Access: Stairs to enter [employer] of Steps: 4 Entrance Stairs-Rails: Left Home Layout: One level     Bathroom Shower/Tub: Tub/shower unit Shower/tub characteristics: Engineer, building services: Standard Bathroom Accessibility: Yes How Accessible: Accessible via walker Home Equipment: Bedside commode;Shower seat;Grab bars - tub/shower;Hospital bed;Walker - 2 wheels;Walker - 4 wheels   Additional Comments: Neighbor can stay with patient for home d/c if needed      Prior Functioning/Environment Level of Independence: Independent        Comments: drives, cooks, tends to yard; has cleaning person every 2 weeks    OT Diagnosis: Generalized weakness   OT Problem List: Decreased activity tolerance;Impaired balance (sitting and/or standing);Decreased  knowledge of use of DME or AE;Cardiopulmonary status limiting activity   OT Treatment/Interventions: Self-care/ADL training;Energy conservation;Patient/family education    OT Goals(Current goals can be found in the care plan section) Acute Rehab OT Goals Patient Stated Goal: wants to get better OT Goal Formulation: With patient Time For Goal Achievement: 02/18/15 Potential to Achieve Goals:  Good ADL Goals Pt Will Perform Grooming: with modified independence;standing Pt Will Transfer to Toilet: with modified independence;ambulating;regular height toilet Pt Will Perform Toileting - Clothing Manipulation and hygiene: with modified independence;sit to/from stand Additional ADL Goal #1: Pt will generalize energy conservation strategies in ADL and mobility independently.  OT Frequency: Min 2X/week   Barriers to D/C:            Co-evaluation              End of Session Equipment Utilized During Treatment: Rolling walker;Oxygen (2L) Nurse Communication:  (IV dislodged)  Activity Tolerance: Patient limited by fatigue Patient left: in bed;with call bell/phone within reach   Time: 5176-1607 OT Time Calculation (min): 39 min Charges:  OT General Charges $OT Visit: 1 Procedure OT Evaluation $Initial OT Evaluation Tier I: 1 Procedure OT Treatments $Self Care/Home Management : 23-37 mins G-Codes:    Evern Bio 02/11/2015, 4:05 PM  [phone]

## 2015-02-11 NOTE — Progress Notes (Signed)
ANTICOAGULATION CONSULT NOTE - Initial Consult  Pharmacy Consult for Warfarin  Indication: atrial fibrillation  Labs:  Recent Labs  02/10/15 1742 02/11/15 0443  HGB 10.1* 9.4*  HCT 34.3* 31.5*  PLT 175 180  LABPROT 40.7* 43.0*  INR 4.18* 4.49*  CREATININE 2.29* 2.43*   Assessment: 61 YOM who presented with worsening shortness of breath for one month. On Coumadin PTA for Afib. INR on admission was elevated and it has risen to 4.49 today. FOBT ordered   Goal of Therapy:  INR 2-3 Monitor platelets by anticoagulation protocol: Yes   Plan:  -No warfarin tonight -Re-check INR in the AM -Re-start warfarin as INR allows  Vinnie Level, PharmD., BCPS Clinical Pharmacist Pager [phone]

## 2015-02-11 NOTE — Care Management Note (Signed)
    Page 1 of 2   02/13/2015     4:13:31 PM CARE MANAGEMENT NOTE 02/13/2015  Patient:  Keith Larson, Keith Larson   Account Number:  [id number]  Date Initiated:  02/11/2015  Documentation initiated by:  Donn Pierini  Subjective/Objective Assessment:   Pt admitted with COPD     Action/Plan:   PTA pt lived at home- has friend that help out as an Engineer, production (pays out of pocket)- PCP- per pt is at the [place]-- per pt he has RW, scooter, and cane- home 02 at 3L   Anticipated DC Date:  02/13/2015   Anticipated DC Plan:  HOME W HOME HEALTH SERVICES      DC Planning Services  CM consult      [facility] Choice  HOME HEALTH   Choice offered to / List presented to:  C-1 Patient        HH arranged  HH-1 RN  HH-2 PT      [facility] agency  [facility]   Status of service:  Completed, signed off Medicare Important Message given?  YES (If response is "NO", the following Medicare IM given date fields will be blank) Date Medicare IM given:  02/13/2015 Medicare IM given by:  Donn Pierini Date Additional Medicare IM given:   Additional Medicare IM given by:    Discharge Disposition:  HOME W HOME HEALTH SERVICES  Per UR Regulation:  Reviewed for med. necessity/level of care/duration of stay  If discussed at Long Length of Stay Meetings, dates discussed:    Comments:  02/13/15- 1600- Donn Pierini RN, BSN [phone] Pt for d/c home today, [facility] aware and will follow for [facility]  02/11/15- 1500- Donn Pierini RN, BSN [phone] Orders placed for HH-RN/PT- spoke with pt at bedside- per conversation pt states that he has had HH in past does not remember who- list of [facility] agencies for [place] given to pt- per choice pt would like to use [facility]- referral called to Katie with [facility]- for HH-RN/PT- per pt he already has RW, cane and scooter at home- he also has home 02 although he does not remember what agency supplies his home 02-

## 2015-02-11 NOTE — Evaluation (Signed)
Physical Therapy Evaluation Patient Details Name: Keith Larson MRN: [id number] DOB: 10/28/1932 Today's Date: 02/11/2015   History of Present Illness  History of Present Illness: Keith Larson is a 81 y.o. male presenting with shortness of breath that has been worsening over the past month. She denies recent illness, fevers, chills, productive cough. Denies chest pain, lower extremity swelling, worsening orthopnea. Patient says that he has decreased exercise tolerance. 3 L of oxygen at baseline at home. He has not had a worsening cough. He continues to smoke daily. He is in control of taking his own medications, does not know the names of them but says that he takes them daily. When prompted with names of medications he recognized them. Additionally, he lives alone and is able to get around his house somewhat okay. He cooks for himself, says that he normally eats beans and potatoes. He has a friend who comes to his house to help with housework as well.  Clinical Impression   Pt admitted with above diagnosis. Pt currently with functional limitations due to the deficits listed below (see PT Problem List).  Pt will benefit from skilled PT to increase their independence and safety with mobility to allow discharge to the venue listed below.       Follow Up Recommendations Home health PT;Other (comment) (HHOT, HHRN for chronic disease management; If pt does not qualify for [facility] services, will recommend Outpatient Pulmonary Rehab)    Equipment Recommendations  Other (comment) (Rollator RW if pt is borrowing his brother's )    Recommendations for Other Services OT consult (for energy conservation)     Precautions / Restrictions Precautions Precaution Comments: O2 sats drop with activity Restrictions Weight Bearing Restrictions: No      Mobility  Bed Mobility Overal bed mobility: Modified Independent                Transfers Overall transfer level: Needs assistance Equipment used: Rolling  walker (2 wheeled) (supplemental O2) Transfers: Sit to/from Stand Sit to Stand: Min guard         General transfer comment: Cues for technique, and to self-monitor for activity tolerance  Ambulation/Gait Ambulation/Gait assistance: Min guard Ambulation Distance (Feet): 40 Feet Assistive device: Rolling walker (2 wheeled) Gait Pattern/deviations: Step-through pattern     General Gait Details: Cues mostly to self-monitor for activity tolerance; O2 sats dropped to 85% observed lowest, so returned to room; recovered to 91% with seated rest; whole session conducted on 2 L supplemental O2  Stairs            Wheelchair Mobility    Modified Rankin (Stroke Patients Only)       Balance                                             Pertinent Vitals/Pain Pain Assessment: No/denies pain    Home Living Family/patient expects to be discharged to:: Private residence Living Arrangements: Alone Available Help at Discharge: Friend(s);Available 24 hours/day Type of Home: House Home Access: Stairs to enter Entrance Stairs-Rails: Left Entrance Stairs-Number of Steps: 4 Home Layout: One level Home Equipment: Bedside commode;Shower seat;Grab bars - tub/shower;Hospital bed;Walker - 2 wheels;Walker - 4 wheels (Is he borrowing Rollator RW from brother?) Additional Comments: Neighbor can stay with patient for home d/c if needed    Prior Function Level of Independence: Independent  Comments: drives, cooks, tends to yard; has cleaning person every 2 weeks     Hand Dominance   Dominant Hand: Right    Extremity/Trunk Assessment   Upper Extremity Assessment: Overall WFL for tasks assessed           Lower Extremity Assessment: Generalized weakness      Cervical / Trunk Assessment: Normal  Communication   Communication: HOH  Cognition Arousal/Alertness: Awake/alert Behavior During Therapy: WFL for tasks assessed/performed Overall Cognitive  Status: Within Functional Limits for tasks assessed                      General Comments      Exercises        Assessment/Plan    PT Assessment Patient needs continued PT services  PT Diagnosis Difficulty walking;Generalized weakness (decreased functional capacity)   PT Problem List Decreased strength;Decreased activity tolerance;Decreased mobility;Cardiopulmonary status limiting activity;Decreased knowledge of use of DME  PT Treatment Interventions DME instruction;Gait training;Stair training;Functional mobility training;Therapeutic activities;Therapeutic exercise;Patient/family education   PT Goals (Current goals can be found in the Care Plan section) Acute Rehab PT Goals Patient Stated Goal: wants to get better PT Goal Formulation: With patient Time For Goal Achievement: 02/25/15 Potential to Achieve Goals: Good    Frequency Min 3X/week   Barriers to discharge        Co-evaluation               End of Session Equipment Utilized During Treatment: Gait belt;Oxygen Activity Tolerance: Patient limited by fatigue Patient left: in bed;with call bell/phone within reach (sitting EOB) Nurse Communication: Mobility status         Time: 1610-9604 PT Time Calculation (min) (ACUTE ONLY): 19 min   Charges:   PT Evaluation $Initial PT Evaluation Tier I: 1 Procedure     PT G CodesVan Clines Hamff 02/11/2015, 12:04 PM  Van Clines, PT  Acute Rehabilitation Services Pager [phone] Office [phone]

## 2015-02-12 DIAGNOSIS — R0602 Shortness of breath: Secondary | ICD-10-CM

## 2015-02-12 LAB — HEMOGLOBIN A1C
HEMOGLOBIN A1C: 6.1 % — AB (ref 4.8–5.6)
MEAN PLASMA GLUCOSE: 128 mg/dL

## 2015-02-12 LAB — COMPREHENSIVE METABOLIC PANEL
ALT: 16 U/L (ref 0–53)
AST: 16 U/L (ref 0–37)
Albumin: 3.1 g/dL — ABNORMAL LOW (ref 3.5–5.2)
Alkaline Phosphatase: 37 U/L — ABNORMAL LOW (ref 39–117)
Anion gap: 7 (ref 5–15)
BILIRUBIN TOTAL: 0.4 mg/dL (ref 0.3–1.2)
BUN: 57 mg/dL — AB (ref 6–23)
CO2: 28 mmol/L (ref 19–32)
Calcium: 8.8 mg/dL (ref 8.4–10.5)
Chloride: 103 mmol/L (ref 96–112)
Creatinine, Ser: 2.48 mg/dL — ABNORMAL HIGH (ref 0.50–1.35)
GFR calc non Af Amer: 23 mL/min — ABNORMAL LOW (ref >90)
GFR, EST AFRICAN AMERICAN: 26 mL/min — AB (ref >90)
Glucose, Bld: 118 mg/dL — ABNORMAL HIGH (ref 70–99)
POTASSIUM: 5.1 mmol/L (ref 3.5–5.1)
Sodium: 138 mmol/L (ref 135–145)
TOTAL PROTEIN: 5.9 g/dL — AB (ref 6.0–8.3)

## 2015-02-12 LAB — IRON AND TIBC
Iron: 31 ug/dL — ABNORMAL LOW (ref 42–165)
Saturation Ratios: 11 % — ABNORMAL LOW (ref 20–55)
TIBC: 284 ug/dL (ref 215–435)
UIBC: 253 ug/dL (ref 125–400)

## 2015-02-12 LAB — VITAMIN B12: Vitamin B-12: 344 pg/mL (ref 211–911)

## 2015-02-12 LAB — GLUCOSE, CAPILLARY
GLUCOSE-CAPILLARY: 118 mg/dL — AB (ref 70–99)
GLUCOSE-CAPILLARY: 153 mg/dL — AB (ref 70–99)
GLUCOSE-CAPILLARY: 174 mg/dL — AB (ref 70–99)
GLUCOSE-CAPILLARY: 243 mg/dL — AB (ref 70–99)
Glucose-Capillary: 111 mg/dL — ABNORMAL HIGH (ref 70–99)

## 2015-02-12 LAB — CBC
HCT: 30.5 % — ABNORMAL LOW (ref 39.0–52.0)
Hemoglobin: 9.1 g/dL — ABNORMAL LOW (ref 13.0–17.0)
MCH: 27.7 pg (ref 26.0–34.0)
MCHC: 29.8 g/dL — AB (ref 30.0–36.0)
MCV: 92.7 fL (ref 78.0–100.0)
Platelets: 201 10*3/uL (ref 150–400)
RBC: 3.29 MIL/uL — ABNORMAL LOW (ref 4.22–5.81)
RDW: 15.1 % (ref 11.5–15.5)
WBC: 8.4 10*3/uL (ref 4.0–10.5)

## 2015-02-12 LAB — FOLATE: Folate: >20 ng/mL

## 2015-02-12 LAB — PROTIME-INR
INR: 3.88 — AB (ref 0.00–1.49)
Prothrombin Time: 38.4 seconds — ABNORMAL HIGH (ref 11.6–15.2)

## 2015-02-12 LAB — FERRITIN: Ferritin: 281 ng/mL (ref 22–322)

## 2015-02-12 MED ORDER — ENSURE COMPLETE PO LIQD: 237.0000 mL | Freq: Two times a day (BID) | ORAL | Status: AC

## 2015-02-12 MED ORDER — WARFARIN - PHARMACIST DOSING INPATIENT
Freq: Every day | Status: DC
Start: 2015-02-12 — End: 2015-02-13

## 2015-02-12 MED ORDER — INSULIN ASPART 100 UNIT/ML ~~LOC~~ SOLN: 0.0000 [IU] | Freq: Every day | SUBCUTANEOUS | Status: DC

## 2015-02-12 MED ORDER — ATORVASTATIN CALCIUM 10 MG PO TABS: 10.0000 mg | ORAL_TABLET | Freq: Every day | ORAL | Status: AC

## 2015-02-12 MED ORDER — ATORVASTATIN CALCIUM 10 MG PO TABS
10.0000 mg | ORAL_TABLET | Freq: Every day | ORAL | Status: DC
  Administered 2015-02-12: 10 mg via ORAL
  Filled 2015-02-12 (×2): qty 1

## 2015-02-12 MED ORDER — INSULIN ASPART 100 UNIT/ML ~~LOC~~ SOLN: 0.0000 [IU] | Freq: Three times a day (TID) | SUBCUTANEOUS | Status: DC

## 2015-02-12 MED ORDER — IPRATROPIUM-ALBUTEROL 0.5-2.5 (3) MG/3ML IN SOLN
3.0000 mL | Freq: Four times a day (QID) | RESPIRATORY_TRACT | Status: DC
  Administered 2015-02-12 – 2015-02-13 (×4): 3 mL via RESPIRATORY_TRACT
  Filled 2015-02-12 (×4): qty 3

## 2015-02-12 MED ORDER — ENSURE COMPLETE PO LIQD
237.0000 mL | Freq: Two times a day (BID) | ORAL | Status: DC
  Administered 2015-02-12 – 2015-02-13 (×2): 237 mL via ORAL

## 2015-02-12 NOTE — Progress Notes (Signed)
ANTICOAGULATION CONSULT NOTE - Initial Consult  Pharmacy Consult for Warfarin  Indication: atrial fibrillation  Labs:  Recent Labs  02/10/15 1742 02/11/15 0443 02/12/15 0528  HGB 10.1* 9.4* 9.1*  HCT 34.3* 31.5* 30.5*  PLT 175 180 201  LABPROT 40.7* 43.0* 38.4*  INR 4.18* 4.49* 3.88*  CREATININE 2.29* 2.43* 2.48*   Assessment: 61 YOM who presented with worsening shortness of breath for one month. On Coumadin PTA for Afib. INR on admission was elevated but trended down to 3.88. H/H and Plt remain stable. No s/s of bleeding noted.   Goal of Therapy:  INR 2-3 Monitor platelets by anticoagulation protocol: Yes   Plan:  -No warfarin tonight -Re-check INR in the AM -Re-start warfarin as INR allows. Dose conservatively given drug interaction with doxycycline.   Vinnie Level, PharmD., BCPS Clinical Pharmacist Pager [phone]

## 2015-02-12 NOTE — Progress Notes (Signed)
Family Medicine Teaching Service Daily Progress Note Intern Pager: [phone]  [name] Medical record number: [id number] Date of birth: [birth year] Age: 81 y.o. Gender: male  Primary Care Provider: Pcp Not In System Consultants: None Code Status: Full  Pt Overview and Major Events to Date:  3/15: Admitted for worsening dyspnea  Assessment and Plan: Keith Larson is a 81 y.o. male presenting with worsening shortness of breath for one month . PMH is significant for COPD, atrial fibrillation, cardiomyopathy, coronary artery disease status post stent of the AM, chronic hypoxic respiratory failure, hypertension, dyslipidemia, CKDIII.   COPD exacerbation: Patient here with increasing shortness of breath for the past month. No known viral prodrome, no productive cough, no fevers, baseline O2 requirement of 3 L. Afebrile here, vital signs stable, white count 5.8. Wheezes diffusely on exam. Increase in oxygen requirement to 5 L to maintain saturations 90%. Elevated BNP on admission to 342. Some congestion on chest x-ray. No lower extremity edema. No clear baseline weight. No orthopnea. Worsening acutelt this morning. - telemtry  - Duoneb q6hrs scheduled due to increase WOB and wheezing - Albuterol every 2 hours as needed - Prednisone 50 mg for 5 days  - Doxycycline 100 mg twice a day for 5 days - Continue home COPD medications including Symbicort, Spiriva - O2 supplementation to keep sats greater than 90% - currently on 2L(baseline) - Strict I and O's, daily weights - Echo pending - Last echo in 2014 unremarkable  Atrial fibrillation: Some right ventricular hypertrophy on EKG, telemetry with sinus arrhythmia. PR appears normal on EKG. There are P waves present. Patient on anticoagulation with Coumadin. Rate controlled at this time, on metoprolol at home. Supratherapuetic INR but improving - Coumadin per pharmacy - Monitor on telemetry - Metoprolol at home dose - HR controlled -  Echo pending - TSH wnl  CAD/ischemic heart disease: Patient with known history of coronary artery disease. LHC 4/12: OM1 90-95% occluded, mid AV circumflex occluded with right to left collaterals, RCA 20-30% => b. PCI: Bare-metal stent to CFX. Troponin here was negative, EKG without acute changes concerning for ACS, patient has no chest pain. Patient chronically anticoagulated with Coumadin, INR greater than 4 on admission. - Anticoagulation with Coumadin as above.  - Rate control with metoprolol  - Telemetry monitoring.  - Known CAD and no statin on board at admission.  - monitor LFTs   Normocyteic anemia: Hbg down trending. Baseline Hbg 12. Patient states last colonoscopy 2 years ago. Denies any bleeding. Anemia of chronic disease . - Hbg 9.1 today - FOBT ordered - trend CBC  Elevated blood sugars: Likely due to prednisone. No diagnosis of diabetes - A1c 6.1 - sensitive SSI  Hypertension - normotensive here - Continue home metoprolol 25 twice a day  CKDIII with AKI- patient with known chronic kidney disease. Creatinine slightly up and GFR slightly down from prior admissions.  - Cr elevated from admission 2.29> 2.43>2.48 - hyperkalemic improved s/p Kayexalate - Continue to trend BMP - Avoiding nephrotoxic meds, renally dose all necessary medications. -encourage oral hydration  Hyperlipidemia - last lipid panel 2011, statin stopped due to elevated liver enzymes and never restarted at last hospitalization.  - lipid panel without HLD; however due to CAD will continue low-dose statin - LFTS appropriate  FEN/GI: Heart healthy diet Prophylaxis: On Coumadin  Disposition: Continue current management as above; pending respiratory improvement.   Subjective:  Patient had a "breathing attack" this morning. He states that he had not  received any breathing treatment for this but his nurse was aware. Otherwise patient still on home oxygen requirement.   Objective: Temp:  [97.8 F (36.6  C)-98.2 F (36.8 C)] 97.8 F (36.6 C) (03/17 0504) Pulse Rate:  [88-104] 95 (03/17 0504) Resp:  [17-18] 17 (03/17 0504) BP: (110-118)/(43-59) 118/59 mmHg (03/17 0504) SpO2:  [93 %-100 %] 98 % (03/17 0504) Physical Exam: General: NAD, AAOx3 HEENT: NCAT, EOMI, MMM Cardiovascular: Regular rate, irregular rhythm, No MGR 2+ distal pulses Respiratory: Diffuse biphasic wheezes throughout, but otherwise somewhat poor air movement. Appropriate rate, unlabored. Gorman in place  Abdomen: S, NT, ND, +BS Extremities: WWP, 2+ distal pulses, No edema noted.  Skin: No lesions no rashes  Neuro: AAOx3, No focal deficits at this time.   Laboratory: Results for orders placed or performed during the hospital encounter of 02/10/15 (from the past 24 hour(s))  Vitamin B12     Status: None   Collection Time: 02/11/15 11:30 AM  Result Value Ref Range   Vitamin B-12 344 211 - 911 pg/mL  Folate     Status: None   Collection Time: 02/11/15 11:30 AM  Result Value Ref Range   Folate >20.0 ng/mL  Ferritin     Status: None   Collection Time: 02/11/15 11:30 AM  Result Value Ref Range   Ferritin 281 22 - 322 ng/mL  Reticulocytes     Status: Abnormal   Collection Time: 02/11/15 11:30 AM  Result Value Ref Range   Retic Ct Pct 1.3 0.4 - 3.1 %   RBC. 3.47 (L) 4.22 - 5.81 MIL/uL   Retic Count, Manual 45.1 19.0 - 186.0 K/uL  Lipid panel     Status: Abnormal   Collection Time: 02/11/15 11:30 AM  Result Value Ref Range   Cholesterol 109 0 - 200 mg/dL   Triglycerides 57 <449 mg/dL   HDL 38 (L) >75 mg/dL   Total CHOL/HDL Ratio 2.9 RATIO   VLDL 11 0 - 40 mg/dL   LDL Cholesterol 60 0 - 99 mg/dL  Glucose, capillary     Status: Abnormal   Collection Time: 02/11/15 11:49 AM  Result Value Ref Range   Glucose-Capillary 203 (H) 70 - 99 mg/dL   Comment 1 Notify RN   Glucose, capillary     Status: Abnormal   Collection Time: 02/11/15  4:19 PM  Result Value Ref Range   Glucose-Capillary 152 (H) 70 - 99 mg/dL    Comment 1 Documented in Char    Comment 2 Repeat Test   Glucose, capillary     Status: Abnormal   Collection Time: 02/11/15  8:49 PM  Result Value Ref Range   Glucose-Capillary 165 (H) 70 - 99 mg/dL   Comment 1 Notify RN    Comment 2 Documented in Char   Protime-INR     Status: Abnormal   Collection Time: 02/12/15  5:28 AM  Result Value Ref Range   Prothrombin Time 38.4 (H) 11.6 - 15.2 seconds   INR 3.88 (H) 0.00 - 1.49  CBC     Status: Abnormal   Collection Time: 02/12/15  5:28 AM  Result Value Ref Range   WBC 8.4 4.0 - 10.5 K/uL   RBC 3.29 (L) 4.22 - 5.81 MIL/uL   Hemoglobin 9.1 (L) 13.0 - 17.0 g/dL   HCT 30.0 (L) 51.1 - 02.1 %   MCV 92.7 78.0 - 100.0 fL   MCH 27.7 26.0 - 34.0 pg   MCHC 29.8 (L) 30.0 - 36.0 g/dL  RDW 15.1 11.5 - 15.5 %   Platelets 201 150 - 400 K/uL  Comprehensive metabolic panel     Status: Abnormal   Collection Time: 02/12/15  5:28 AM  Result Value Ref Range   Sodium 138 135 - 145 mmol/L   Potassium 5.1 3.5 - 5.1 mmol/L   Chloride 103 96 - 112 mmol/L   CO2 28 19 - 32 mmol/L   Glucose, Bld 118 (H) 70 - 99 mg/dL   BUN 57 (H) 6 - 23 mg/dL   Creatinine, Ser 1.61 (H) 0.50 - 1.35 mg/dL   Calcium 8.8 8.4 - 09.6 mg/dL   Total Protein 5.9 (L) 6.0 - 8.3 g/dL   Albumin 3.1 (L) 3.5 - 5.2 g/dL   AST 16 0 - 37 U/L   ALT 16 0 - 53 U/L   Alkaline Phosphatase 37 (L) 39 - 117 U/L   Total Bilirubin 0.4 0.3 - 1.2 mg/dL   GFR calc non Af Amer 23 (L) >90 mL/min   GFR calc Af Amer 26 (L) >90 mL/min   Anion gap 7 5 - 15  Glucose, capillary     Status: Abnormal   Collection Time: 02/12/15  5:44 AM  Result Value Ref Range   Glucose-Capillary 118 (H) 70 - 99 mg/dL    Imaging/Diagnostic Tests: Dg Chest 2 View  02/10/2015   CLINICAL DATA:  Acute onset of shortness of breath. Initial encounter.  EXAM: CHEST  2 VIEW  COMPARISON:  Chest radiograph performed 09/13/2014  FINDINGS: The lungs are hyperexpanded, with flattening of the hemidiaphragms, compatible with COPD. Mild  vascular congestion is noted. There is no evidence of focal opacification, pleural effusion or pneumothorax.  The heart is borderline normal in size. No acute osseous abnormalities are seen.  IMPRESSION: Mild vascular congestion noted, without definite pulmonary edema. Findings of COPD.   Electronically Signed   By: Roanna Raider M.D.   On: 02/10/2015 18:40    Pincus Large, DO 02/12/2015, 8:07 AM PGY-1,  Family Medicine FPTS Intern pager: [phone], text pages welcome

## 2015-02-12 NOTE — Progress Notes (Signed)
  Echocardiogram 2D Echocardiogram has been performed.  Keith Larson 02/12/2015, 1:37 PM

## 2015-02-13 DIAGNOSIS — E43 Unspecified severe protein-calorie malnutrition: Secondary | ICD-10-CM | POA: Insufficient documentation

## 2015-02-13 LAB — BASIC METABOLIC PANEL
ANION GAP: 3 — AB (ref 5–15)
ANION GAP: 7 (ref 5–15)
BUN: 56 mg/dL — AB (ref 6–23)
BUN: 56 mg/dL — ABNORMAL HIGH (ref 6–23)
CHLORIDE: 104 mmol/L (ref 96–112)
CO2: 33 mmol/L — ABNORMAL HIGH (ref 19–32)
CO2: 32 mmol/L (ref 19–32)
CREATININE: 2.15 mg/dL — AB (ref 0.50–1.35)
Calcium: 8.9 mg/dL (ref 8.4–10.5)
Calcium: 9.1 mg/dL (ref 8.4–10.5)
Chloride: 100 mmol/L (ref 96–112)
Creatinine, Ser: 2.31 mg/dL — ABNORMAL HIGH (ref 0.50–1.35)
GFR calc Af Amer: 29 mL/min — ABNORMAL LOW (ref >90)
GFR calc Af Amer: 31 mL/min — ABNORMAL LOW (ref >90)
GFR calc non Af Amer: 25 mL/min — ABNORMAL LOW (ref >90)
GFR calc non Af Amer: 27 mL/min — ABNORMAL LOW (ref >90)
GLUCOSE: 177 mg/dL — AB (ref 70–99)
Glucose, Bld: 126 mg/dL — ABNORMAL HIGH (ref 70–99)
POTASSIUM: 5.7 mmol/L — AB (ref 3.5–5.1)
Potassium: 5.5 mmol/L — ABNORMAL HIGH (ref 3.5–5.1)
Sodium: 140 mmol/L (ref 135–145)
Sodium: 139 mmol/L (ref 135–145)

## 2015-02-13 LAB — CBC
HCT: 30 % — ABNORMAL LOW (ref 39.0–52.0)
Hemoglobin: 9.2 g/dL — ABNORMAL LOW (ref 13.0–17.0)
MCH: 29.1 pg (ref 26.0–34.0)
MCHC: 30.7 g/dL (ref 30.0–36.0)
MCV: 94.9 fL (ref 78.0–100.0)
Platelets: 185 10*3/uL (ref 150–400)
RBC: 3.16 MIL/uL — ABNORMAL LOW (ref 4.22–5.81)
RDW: 14.9 % (ref 11.5–15.5)
WBC: 8.3 10*3/uL (ref 4.0–10.5)

## 2015-02-13 LAB — PROTIME-INR
INR: 2.12 — AB (ref 0.00–1.49)
PROTHROMBIN TIME: 24 s — AB (ref 11.6–15.2)

## 2015-02-13 LAB — GLUCOSE, CAPILLARY
Glucose-Capillary: 150 mg/dL — ABNORMAL HIGH (ref 70–99)
Glucose-Capillary: 123 mg/dL — ABNORMAL HIGH (ref 70–99)
Glucose-Capillary: 128 mg/dL — ABNORMAL HIGH (ref 70–99)
Glucose-Capillary: 173 mg/dL — ABNORMAL HIGH (ref 70–99)

## 2015-02-13 MED ORDER — ALBUTEROL SULFATE (2.5 MG/3ML) 0.083% IN NEBU: 2.5000 mg | INHALATION_SOLUTION | RESPIRATORY_TRACT | Status: DC | PRN

## 2015-02-13 MED ORDER — SODIUM POLYSTYRENE SULFONATE 15 GM/60ML PO SUSP
30.0000 g | Freq: Once | ORAL | Status: AC
  Administered 2015-02-13: 30 g via ORAL
  Filled 2015-02-13: qty 120

## 2015-02-13 MED ORDER — IPRATROPIUM-ALBUTEROL 0.5-2.5 (3) MG/3ML IN SOLN: 3.0000 mL | Freq: Two times a day (BID) | RESPIRATORY_TRACT | Status: DC

## 2015-02-13 MED ORDER — WARFARIN SODIUM 6 MG PO TABS
6.0000 mg | ORAL_TABLET | Freq: Once | ORAL | Status: DC
  Filled 2015-02-13: qty 1

## 2015-02-13 MED ORDER — DOXYCYCLINE HYCLATE 100 MG PO TABS: 100.0000 mg | ORAL_TABLET | Freq: Two times a day (BID) | ORAL | Status: AC

## 2015-02-13 MED ORDER — LISINOPRIL 2.5 MG PO TABS
2.5000 mg | ORAL_TABLET | Freq: Every day | ORAL | Status: DC
  Administered 2015-02-13: 2.5 mg via ORAL
  Filled 2015-02-13: qty 1

## 2015-02-13 MED ORDER — PREDNISONE 50 MG PO TABS: ORAL_TABLET | ORAL | Status: AC

## 2015-02-13 MED ORDER — LISINOPRIL 2.5 MG PO TABS: 2.5000 mg | ORAL_TABLET | Freq: Every day | ORAL | Status: AC

## 2015-02-13 NOTE — Progress Notes (Signed)
Occupational Therapy Treatment Patient Details Name: Keith Larson MRN: [id number] DOB: 1932/11/26 Today's Date: 02/13/2015    History of present illness History of Present Illness: Keith Larson is a 81 y.o. male presenting with shortness of breath that has been worsening over the past month. Denies chest pain, lower extremity swelling, worsening orthopnea. Patient says that he has decreased activity tolerance. 3 L of oxygen at baseline at home.   OT comments  Focus of session on energy conservation strategies, handout provided to education.  Pt with improved activity tolerance and is eager to go home later today.  Follow Up Recommendations  No OT follow up    Equipment Recommendations  None recommended by OT    Recommendations for Other Services      Precautions / Restrictions Precautions Precaution Comments: O2 sats drop with activity Restrictions Weight Bearing Restrictions: No       Mobility Bed Mobility Overal bed mobility: Modified Independent                Transfers Overall transfer level: Needs assistance Equipment used: Rolling walker (2 wheeled) Transfers: Sit to/from Stand Sit to Stand: Modified independent (Device/Increase time)         General transfer comment: good technique    Balance                                   ADL Overall ADL's : Needs assistance/impaired     Grooming: Wash/dry hands;Supervision/safety;Standing                   Toilet Transfer: Modified Independent   Toileting- Clothing Manipulation and Hygiene: Modified independent;Sit to/from stand       Functional mobility during ADLs: Supervision/safety;Rolling walker General ADL Comments: Educated in energy conservaiton strategies and gave pt handout.      Vision                     Perception     Praxis      Cognition   Behavior During Therapy: WFL for tasks assessed/performed Overall Cognitive Status: Within Functional Limits for  tasks assessed                       Extremity/Trunk Assessment               Exercises     Shoulder Instructions       General Comments      Pertinent Vitals/ Pain       Pain Assessment: No/denies pain  Home Living                                          Prior Functioning/Environment              Frequency       Progress Toward Goals  OT Goals(current goals can now be found in the care plan section)  Progress towards OT goals: Progressing toward goals  Acute Rehab OT Goals Patient Stated Goal: wants to get better  Plan Discharge plan remains appropriate    Co-evaluation                 End of Session Equipment Utilized During Treatment: Rolling walker;Oxygen   Activity Tolerance Patient tolerated treatment well   Patient Left in  bed;with call bell/phone within reach   Nurse Communication          Time: 1610-9604 OT Time Calculation (min): 22 min  Charges: OT General Charges $OT Visit: 1 Procedure OT Treatments $Self Care/Home Management : 8-22 mins  Evern Bio 02/13/2015, 11:51 AM  [phone]

## 2015-02-13 NOTE — Progress Notes (Addendum)
Page IM Teaching MD regarding patient's concern about Kayexalate dose before discharge  Keith Larson 3:13 PM 02/13/2015   MD acknowledged pt's concern and decided to go ahead and give him Kayexalate before his discharge 3:23 PM

## 2015-02-13 NOTE — Progress Notes (Signed)
ANTICOAGULATION CONSULT NOTE - Follow Up Consult  Pharmacy Consult for Coumadin Indication: atrial fibrillation  No Known Allergies  Patient Measurements: Height: 5\' 8"  (172.7 cm) Weight: 147 lb 11.3 oz (67 kg) IBW/kg (Calculated) : 68.4  Vital Signs: Temp: 98.3 F (36.8 C) (03/18 0557) Temp Source: Oral (03/18 0557) BP: 117/52 mmHg (03/18 0949) Pulse Rate: 71 (03/18 0949)  Labs:  Recent Labs  02/11/15 0443 02/12/15 0528 02/13/15 0507  HGB 9.4* 9.1* 9.2*  HCT 31.5* 30.5* 30.0*  PLT 180 201 185  LABPROT 43.0* 38.4* 24.0*  INR 4.49* 3.88* 2.12*  CREATININE 2.43* 2.48* 2.31*    Estimated Creatinine Clearance: 23.8 mL/min (by C-G formula based on Cr of 2.31).  Assessment: 83yom on coumadin pta for afib, admitted with COPD exacerbation. INR on admit was above goal. Doses were held 3/15, 3/16, and 3/17. INR is finally back within therapeutic range today at 2.12. He also continues on day #3 doxycycline.  Home dose: 4mg  daily except 6mg  on Mon/Fri.   Goal of Therapy:  INR 2-3 Monitor platelets by anticoagulation protocol: Yes   Plan:  1) Coumadin 6mg  x 1 2) INR in AM  Fredrik Rigger 02/13/2015,10:04 AM

## 2015-02-13 NOTE — Progress Notes (Signed)
Medicare Important Message given? YES  (If response is "NO", the following Medicare IM given date fields will be blank)  Date Medicare IM given: 02/13/15 Medicare IM given by:  Thania Woodlief  

## 2015-02-13 NOTE — Discharge Instructions (Signed)
Please Call your Primary Care Provider to schedule an appointment in the next 2 weeks.   Chronic Obstructive Pulmonary Disease Chronic obstructive pulmonary disease (COPD) is a common lung problem. In COPD, the flow of air from the lungs is limited. The way your lungs work will probably never return to normal, but there are things you can do to improve your lungs and make yourself feel better. HOME CARE  Take all medicines as told by your doctor.  Avoid medicines or cough syrups that dry up your airway (such as antihistamines) and do not allow you to get rid of thick spit. You do not need to avoid them if told differently by your doctor.  If you smoke, stop. Smoking makes the problem worse.  Avoid being around things that make your breathing worse (like smoke, chemicals, and fumes).  Use oxygen therapy and therapy to help improve your lungs (pulmonary rehabilitation) if told by your doctor. If you need home oxygen therapy, ask your doctor if you should buy a tool to measure your oxygen level (oximeter).  Avoid people who have a sickness you can catch (contagious).  Avoid going outside when it is very hot, cold, or humid.  Eat healthy foods. Eat smaller meals more often. Rest before meals.  Stay active, but remember to also rest.  Make sure to get all the shots (vaccines) your doctor recommends. Ask your doctor if you need a pneumonia shot.  Learn and use tips on how to relax.  Learn and use tips on how to control your breathing as told by your doctor. Try:  Breathing in (inhaling) through your nose for 1 second. Then, pucker your lips and breath out (exhale) through your lips for 2 seconds.  Putting one hand on your belly (abdomen). Breathe in slowly through your nose for 1 second. Your hand on your belly should move out. Pucker your lips and breathe out slowly through your lips. Your hand on your belly should move in as you breathe out.  Learn and use controlled coughing to clear  thick spit from your lungs. The steps are: 1. Lean your head a little forward. 2. Breathe in deeply. 3. Try to hold your breath for 3 seconds. 4. Keep your mouth slightly open while coughing 2 times. 5. Spit any thick spit out into a tissue. 6. Rest and do the steps again 1 or 2 times as needed. GET HELP IF:  You cough up more thick spit than usual.  There is a change in the color or thickness of the spit.  It is harder to breathe than usual.  Your breathing is faster than usual. GET HELP RIGHT AWAY IF:   You have shortness of breath while resting.  You have shortness of breath that stops you from:  Being able to talk.  Doing normal activities.  You chest hurts for longer than 5 minutes.  Your skin color is more blue than usual.  Your pulse oximeter shows that you have low oxygen for longer than 5 minutes. MAKE SURE YOU:   Understand these instructions.  Will watch your condition.  Will get help right away if you are not doing well or get worse. Document Released: 05/02/2008 Document Revised: 03/31/2014 Document Reviewed: 07/11/2013 [facility] Patient Information 2015 [place], [place]. This information is not intended to replace advice given to you by your health care provider. Make sure you discuss any questions you have with your health care provider.

## 2015-02-13 NOTE — Progress Notes (Addendum)
Paged IM Teaching MD for discharge order 13:30 PM  IM Teaching MD acknowledged and is working on putting discharge order for patient. 13:40 PM

## 2015-02-13 NOTE — Progress Notes (Signed)
Discharge Note  Discontinued IV access Discontinued Telemetry monitor Pt informed about discharge instructions and discharge medications Pt discharged to home on wheelchair by staff All pts belongings sent with pt.  Kendall Flack 5:02 PM 02/13/2015

## 2015-02-13 NOTE — Discharge Summary (Signed)
Family Medicine Teaching [facility] Discharge Summary  [name] Medical record number: [id number] Date of birth: 08-29-1932 Age: 81 y.o. Gender: male Date of Admission: 02/10/2015  Date of Discharge: 02/13/2015 Admitting Physician: Uvaldo Rising, MD  Primary Care Provider: Pcp Not In System Consultants: None  Indication for Hospitalization: COPD exacerbation  Discharge Diagnoses/Problem List:  Patient Active Problem List   Diagnosis Date Noted  . Protein-calorie malnutrition, severe 02/13/2015  . Coronary artery disease involving native coronary artery of native heart without angina pectoris   . Essential hypertension   . Hyperlipidemia   . Cardiomyopathy, ischemic   . Chronic kidney disease, stage 3   . COPD with acute exacerbation 02/10/2015  . Acute respiratory failure with hypoxia 03/05/2014  . CKD (chronic kidney disease) stage 3, GFR 30-59 ml/min 03/05/2014  . Acute diastolic CHF (congestive heart failure) 03/05/2014  . Respiratory failure with hypoxia 03/05/2014  . HTN (hypertension) 03/05/2014  . Dyslipidemia 03/05/2014  . COPD exacerbation 02/05/2013  . Cardiomyopathy, nonischemic 04/05/2011  . Coronary artery disease status post bare-metal stenting of the acute marginal 04/05/2011  . ACE inhibitor intolerance   . Atrial fibrillation    Disposition: Home with HH PT and RN  Discharge Condition: Improved  Discharge Exam:  Filed Vitals:   02/13/15 1435  BP: 118/63  Pulse: 86  Temp: 97.6 F (36.4 C)  Resp:    General: NAD, AAOx3 HEENT: NCAT, EOMI, MMM Cardiovascular: RRR, No MGR 2+ distal pulses Respiratory: minimal wheeze bilat, good air movement. Appropriate rate, unlabored. Ione in place  Abdomen: S, NT, ND, +BS Extremities: WWP, 2+ distal pulses, No edema noted.  Skin: No lesions no rashes  Neuro: AAOx3, No focal deficits at this time.    Brief Hospital Course:  Keith Larson is a 81 y.o. male who presented with worsening  shortness of breath. PMH is significant for COPD, atrial fibrillation, cardiomyopathy, coronary artery disease status post stent of the AM, chronic hypoxic respiratory failure, hypertension, dyslipidemia, CKDIII.   COPD exacerbation: Patient with increasing shortness of breath for the past month with increasing oxygen requirment. No known viral prodrome, no productive cough, no fevers, baseline O2 requirement of 3 L. Afebrile here, vital signs stable, white count 5.8. Patient received duo-nebs for exacerbation and then was able to be controlled with just home medications. He was given a short burst of steroids and doxycyline. At time of discharge patient was able to be weaned back down to home O2 requirement.   CHF, diastolic: On admission patient had elevated BNP to 342. Some congestion on chest x-ray. No lower extremity edema. No clear baseline weight. No orthopnea. Appears to be a new diagnosis, echocardiogram on 3/17 showed inferior lateral akinesis, inferior hypokinesis, overall mild left ventricular function (EF 45), grade 2 diastolic dysfunction, bilateral enlargement, mild MR. These findings are new compared to his last echo. Patient was started on low-dose ACEi and already on BB for rate control of his A.fib.  CKDIII with AKI: Patient with known chronic kidney disease. Patient had elevated potassium. He was given Kayexalate on two different occasions.   The patient's other chronic conditions were stable during this hospitalization and the patient was continued on home medications.    Issues for Follow Up:  1. Repeat BMP; patient with CKD3 and hyperkalemia - Likely needs to follow with nephrology 2. Work-up anemia 3. Cardiology follow-up for changes on Echo - started on ACEi this admission 4. Home health PT and RN ordered  Significant  Procedures: None  Significant Labs and Imaging:   Recent Labs Lab 02/11/15 0443 02/12/15 0528 02/13/15 0507  WBC 3.6* 8.4 8.3  HGB 9.4* 9.1* 9.2*   HCT 31.5* 30.5* 30.0*  PLT 180 201 185    Recent Labs Lab 02/10/15 1742 02/11/15 0443 02/12/15 0528 02/13/15 0507 02/13/15 1330  NA 138 137 138 140 139  K 5.4* 5.7* 5.1 5.7* 5.5*  CL 103 106 103 104 100  CO2 25 25 28  33* 32  GLUCOSE 99 153* 118* 126* 177*  BUN 40* 44* 57* 56* 56*  CREATININE 2.29* 2.43* 2.48* 2.31* 2.15*  CALCIUM 8.7 8.6 8.8 8.9 9.1  ALKPHOS  --  43 37*  --   --   AST  --  16 16  --   --   ALT  --  17 16  --   --   ALBUMIN  --  3.1* 3.1*  --   --    Dg Chest 2 View  02/10/2015 CLINICAL DATA: Acute onset of shortness of breath. Initial encounter. EXAM: CHEST 2 VIEW COMPARISON: Chest radiograph performed 09/13/2014 FINDINGS: The lungs are hyperexpanded, with flattening of the hemidiaphragms, compatible with COPD. Mild vascular congestion is noted. There is no evidence of focal opacification, pleural effusion or pneumothorax. The heart is borderline normal in size. No acute osseous abnormalities are seen. IMPRESSION: Mild vascular congestion noted, without definite pulmonary edema. Findings of COPD. Electronically Signed By: Roanna Raider M.D. On: 02/10/2015 18:40   Echo 3/17 Impressions: - Inferolateral akinesis; inferior hypokinesis; overall mildly reduced LV function (EF 45); grade 2 diastolic dysfunction; biatrial enlargement; mild MR.  Results/Tests Pending at Time of Discharge: None  Discharge Medications:    Medication List    TAKE these medications        albuterol 108 (90 BASE) MCG/ACT inhaler  Commonly known as:  PROVENTIL HFA;VENTOLIN HFA  Inhale 2 puffs into the lungs 2 (two) times daily.     atorvastatin 10 MG tablet  Commonly known as:  LIPITOR  Take 1 tablet (10 mg total) by mouth daily at 6 PM.     doxycycline 100 MG tablet  Commonly known as:  VIBRA-TABS  Take 1 tablet (100 mg total) by mouth 2 (two) times daily.     feeding supplement (ENSURE COMPLETE) Liqd  Take 237 mLs by mouth 2 (two) times daily between  meals.     furosemide 20 MG tablet  Commonly known as:  LASIX  Take 1 tablet (20 mg total) by mouth daily as needed for edema.     guaiFENesin 100 MG/5ML liquid  Commonly known as:  ROBITUSSIN  Take 200 mg by mouth 4 (four) times daily as needed for cough.     lisinopril 2.5 MG tablet  Commonly known as:  PRINIVIL,ZESTRIL  Take 1 tablet (2.5 mg total) by mouth daily.     metoprolol 50 MG tablet  Commonly known as:  LOPRESSOR  Take 25 mg by mouth 2 (two) times daily.     predniSONE 50 MG tablet  Commonly known as:  DELTASONE  One tablet (50mg ) daily.     SYMBICORT 160-4.5 MCG/ACT inhaler  Generic drug:  budesonide-formoterol  Inhale 1 puff into the lungs 2 (two) times daily.     tiotropium 18 MCG inhalation capsule  Commonly known as:  SPIRIVA  Place 1 capsule (18 mcg total) into inhaler and inhale daily.     warfarin 2 MG tablet  Commonly known as:  COUMADIN  - Take 4-6  mg by mouth every evening. Take  on Mon / Fri ONLY  - All other days take   Including Sunday        Discharge Instructions: Please refer to Patient Instructions section of EMR for full details.  Patient was counseled important signs and symptoms that should prompt return to medical care, changes in medications, dietary instructions, activity restrictions, and follow up appointments.   Follow-Up Appointments:     Follow-up Information    Follow up with [facility].   Why:  HH-RN/PT arranged   Contact information:   [address] [place] [address] [phone]       Pincus Large, DO 02/13/2015, 9:47 PM PGY-1, [facility] Family [facility]

## 2015-02-13 NOTE — Progress Notes (Signed)
Physical Therapy Treatment Patient Details Name: Keith Larson MRN: [id number] DOB: 09-01-1932 Today's Date: 02/13/2015    History of Present Illness History of Present Illness: Keith Larson is a 81 y.o. male presenting with shortness of breath that has been worsening over the past month. She denies recent illness, fevers, chills, productive cough. Denies chest pain, lower extremity swelling, worsening orthopnea. Patient says that he has decreased exercise tolerance. 3 L of oxygen at baseline at home. He has not had a worsening cough. He continues to smoke daily. He is in control of taking his own medications, does not know the names of them but says that he takes them daily. When prompted with names of medications he recognized them. Additionally, he lives alone and is able to get around his house somewhat okay. He cooks for himself, says that he normally eats beans and potatoes. He has a friend who comes to his house to help with housework as well.    PT Comments    Patient progressing well towards physical therapy goals, ambulating up to 100 feet today. SpO2 briefly registered at 84% on 3L supplemental O2 at very end of distance, however improved to mid 90s after sitting with cues for pursed lip breathing. Demonstrates good control of rolling walker and did not require physical assist during treatment. Feel he is adequate for d/c from a mobility standpoint. Patient will continue to benefit from skilled physical therapy services at home with HHPT to further improve independence with functional mobility.   Follow Up Recommendations  Home health PT;Other (comment) [facility] for chronic disease management)     Equipment Recommendations  None recommended by PT    Recommendations for Other Services OT consult (for energy conservation)     Precautions / Restrictions Precautions Precaution Comments: O2 sats drop with activity Restrictions Weight Bearing Restrictions: No    Mobility  Bed  Mobility Overal bed mobility: Modified Independent                Transfers Overall transfer level: Needs assistance Equipment used: Rolling walker (2 wheeled) (supplemental O2) Transfers: Sit to/from Stand Sit to Stand: Supervision         General transfer comment: Supervision for safety. Cues for hand placement.  Ambulation/Gait Ambulation/Gait assistance: Supervision Ambulation Distance (Feet): 100 Feet Assistive device: Rolling walker (2 wheeled) Gait Pattern/deviations: Step-through pattern;Decreased stride length Gait velocity: decreased   General Gait Details: Cues for pursed lip breathing throughout. required one standing rest break, 1/4 dyspnea. On 2L supplemental O2 at 50 feet pt SpO2 88% - adjusted to 3L supplemental O2 and SpO2 dropped to 84% after ambulating additional 50 feet. Returned to 93% and greater in 2 minutes after sitting. No balance loss during bout. Demonstrates good control of RW.   Stairs            Wheelchair Mobility    Modified Rankin (Stroke Patients Only)       Balance                                    Cognition Arousal/Alertness: Awake/alert Behavior During Therapy: WFL for tasks assessed/performed Overall Cognitive Status: Within Functional Limits for tasks assessed                      Exercises      General Comments        Pertinent Vitals/Pain Pain Assessment: No/denies pain  HR 101    Home Living                      Prior Function            PT Goals (current goals can now be found in the care plan section) Acute Rehab PT Goals PT Goal Formulation: With patient Time For Goal Achievement: 02/25/15 Potential to Achieve Goals: Good Progress towards PT goals: Progressing toward goals    Frequency  Min 3X/week    PT Plan Current plan remains appropriate    Co-evaluation             End of Session Equipment Utilized During Treatment: Gait belt;Oxygen Activity  Tolerance: Patient tolerated treatment well Patient left: with call bell/phone within reach;in chair;with nursing/sitter in room     Time: 0846-0902 PT Time Calculation (min) (ACUTE ONLY): 16 min  Charges:  $Gait Training: 8-22 mins                    G Codes:      Berton Mount 13-Mar-2015, 9:31 AM Charlsie Merles, PT [phone]

## 2015-02-13 NOTE — Progress Notes (Signed)
Family Medicine Teaching Service Daily Progress Note Intern Pager: [phone]  [name] Medical record number: [id number] Date of birth: [birth year] Age: 81 y.o. Gender: male  Primary Care Provider: Pcp Not In System Consultants: None Code Status: Full  Pt Overview and Major Events to Date:  3/15: Admitted for worsening dyspnea 3/18: likely DC later today  Assessment and Plan: Keith Larson is a 81 y.o. male presenting with worsening shortness of breath for one month . PMH is significant for COPD, atrial fibrillation, cardiomyopathy, coronary artery disease status post stent of the AM, chronic hypoxic respiratory failure, hypertension, dyslipidemia, CKDIII.   COPD exacerbation: Patient here with increasing shortness of breath for the past month. No known viral prodrome, no productive cough, no fevers, baseline O2 requirement of 3 L. Afebrile here, vital signs stable, white count 5.8. Wheezes diffusely on exam. Increase in oxygen requirement to 5 L to maintain saturations 90%. Elevated BNP on admission to 342. Some congestion on chest x-ray. No lower extremity edema. No clear baseline weight. No orthopnea. Worsening acutelt this morning. - telemtry  - Duoneb q6hrs scheduled due to increase WOB and wheezing - Albuterol every 2 hours as needed - Prednisone 50 mg for 5 days  - Doxycycline 100 mg twice a day for 5 days - Continue home COPD medications including Symbicort, Spiriva - O2 supplementation to keep sats greater than 90% - currently on 2L(baseline) - Strict I and O's, daily weights - Echo pending - Last echo in 2014 unremarkable  Atrial fibrillation: Some right ventricular hypertrophy on EKG, telemetry with sinus arrhythmia. PR appears normal on EKG. There are P waves present. Patient on anticoagulation with Coumadin. Rate controlled at this time, on metoprolol at home. Supratherapuetic INR but improving - Coumadin per pharmacy - Monitor on telemetry - Metoprolol at  home dose - HR controlled - Echo pending - TSH wnl  CAD/ischemic heart disease: Patient with known history of coronary artery disease. LHC 4/12: OM1 90-95% occluded, mid AV circumflex occluded with right to left collaterals, RCA 20-30% => b. PCI: Bare-metal stent to CFX. Troponin here was negative, EKG without acute changes concerning for ACS, patient has no chest pain. Patient chronically anticoagulated with Coumadin, INR greater than 4 on admission. - Anticoagulation with Coumadin as above.  - Rate control with metoprolol  - Telemetry monitoring.  - Known CAD and no statin on board at admission.  - monitor LFTs   CHF, diastolic: new diagnosis, echocardiogram on 3/17 showed inferior lateral akinesis, inferior hypokinesis, overall mild left ventricular function (EF 45), grade 2 diastolic dysfunction, bilateral enlargement, mild MR - Started low-dose ACEi; lisinopril 2.5 mg QD - consider change from metoprolol to Coreg  Normocyteic anemia: Hbg down trending. Baseline Hbg 12. Patient states last colonoscopy 2 years ago. Denies any bleeding. Anemia of chronic disease . - Hbg 9.1 today - FOBT ordered - trend CBC  Elevated blood sugars: Likely due to prednisone. No diagnosis of diabetes - A1c 6.1 - sensitive SSI  Hypertension - normotensive here - Continue home metoprolol 25 twice a day  CKDIII with AKI- patient with known chronic kidney disease. Creatinine slightly up and GFR slightly down from prior admissions.  - Cr elevated from admission 2.29> 2.43>2.48 - hyperkalemic improved s/p Kayexalate >> Kayexalate x1 on 3/18 due to K+(5.7) - Continue to trend BMP - Avoiding nephrotoxic meds, renally dose all necessary medications. -encourage oral hydration  Hyperlipidemia - last lipid panel 2011, statin stopped due to elevated liver enzymes and  never restarted at last hospitalization.  - lipid panel without HLD; however due to CAD will continue low-dose statin - LFTS  appropriate  FEN/GI: Heart healthy diet Prophylaxis: On Coumadin  Disposition: Continue current management as above; likely DC today  Subjective:  Patient reports feeling great today. Breathing is back to BL. Ready to go home.   Objective: Temp:  [98.3 F (36.8 C)-98.6 F (37 C)] 98.3 F (36.8 C) (03/18 0557) Pulse Rate:  [71-93] 71 (03/18 0949) Resp:  [16-19] 16 (03/18 0557) BP: (116-128)/(50-70) 128/70 mmHg (03/18 1323) SpO2:  [94 %-95 %] 94 % (03/18 0557) Weight:  [147 lb 11.3 oz (67 kg)] 147 lb 11.3 oz (67 kg) (03/18 0557) Physical Exam: General: NAD, AAOx3 HEENT: NCAT, EOMI, MMM Cardiovascular: RRR, No MGR 2+ distal pulses Respiratory: minimal wheeze bilat, good air movement. Appropriate rate, unlabored. [place] in place  Abdomen: S, NT, ND, +BS Extremities: WWP, 2+ distal pulses, No edema noted.  Skin: No lesions no rashes  Neuro: AAOx3, No focal deficits at this time.   Laboratory: Results for orders placed or performed during the hospital encounter of 02/10/15 (from the past 24 hour(s))  Glucose, capillary     Status: Abnormal   Collection Time: 02/12/15  4:54 PM  Result Value Ref Range   Glucose-Capillary 174 (H) 70 - 99 mg/dL   Comment 1 Notify RN    Comment 2 Document in Chart   Glucose, capillary     Status: Abnormal   Collection Time: 02/12/15  8:42 PM  Result Value Ref Range   Glucose-Capillary 243 (H) 70 - 99 mg/dL   Comment 1 Notify RN    Comment 2 Document in Chart   Glucose, capillary     Status: Abnormal   Collection Time: 02/12/15 11:53 PM  Result Value Ref Range   Glucose-Capillary 153 (H) 70 - 99 mg/dL  Glucose, capillary     Status: Abnormal   Collection Time: 02/13/15  4:26 AM  Result Value Ref Range   Glucose-Capillary 150 (H) 70 - 99 mg/dL   Comment 1 Notify RN   Protime-INR     Status: Abnormal   Collection Time: 02/13/15  5:07 AM  Result Value Ref Range   Prothrombin Time 24.0 (H) 11.6 - 15.2 seconds   INR 2.12 (H) 0.00 - 1.49  Basic  metabolic panel     Status: Abnormal   Collection Time: 02/13/15  5:07 AM  Result Value Ref Range   Sodium 140 135 - 145 mmol/L   Potassium 5.7 (H) 3.5 - 5.1 mmol/L   Chloride 104 96 - 112 mmol/L   CO2 33 (H) 19 - 32 mmol/L   Glucose, Bld 126 (H) 70 - 99 mg/dL   BUN 56 (H) 6 - 23 mg/dL   Creatinine, Ser 1.61 (H) 0.50 - 1.35 mg/dL   Calcium 8.9 8.4 - 09.6 mg/dL   GFR calc non Af Amer 25 (L) >90 mL/min   GFR calc Af Amer 29 (L) >90 mL/min   Anion gap 3 (L) 5 - 15  CBC     Status: Abnormal   Collection Time: 02/13/15  5:07 AM  Result Value Ref Range   WBC 8.3 4.0 - 10.5 K/uL   RBC 3.16 (L) 4.22 - 5.81 MIL/uL   Hemoglobin 9.2 (L) 13.0 - 17.0 g/dL   HCT 04.5 (L) 40.9 - 81.1 %   MCV 94.9 78.0 - 100.0 fL   MCH 29.1 26.0 - 34.0 pg   MCHC 30.7 30.0 -  36.0 g/dL   RDW 20.3 55.9 - 74.1 %   Platelets 185 150 - 400 K/uL  Glucose, capillary     Status: Abnormal   Collection Time: 02/13/15  6:10 AM  Result Value Ref Range   Glucose-Capillary 123 (H) 70 - 99 mg/dL  Glucose, capillary     Status: Abnormal   Collection Time: 02/13/15 11:18 AM  Result Value Ref Range   Glucose-Capillary 128 (H) 70 - 99 mg/dL   Comment 1 Notify RN     Imaging/Diagnostic Tests: Dg Chest 2 View  02/10/2015   CLINICAL DATA:  Acute onset of shortness of breath. Initial encounter.  EXAM: CHEST  2 VIEW  COMPARISON:  Chest radiograph performed 09/13/2014  FINDINGS: The lungs are hyperexpanded, with flattening of the hemidiaphragms, compatible with COPD. Mild vascular congestion is noted. There is no evidence of focal opacification, pleural effusion or pneumothorax.  The heart is borderline normal in size. No acute osseous abnormalities are seen.  IMPRESSION: Mild vascular congestion noted, without definite pulmonary edema. Findings of COPD.   Electronically Signed   By: Roanna Raider M.D.   On: 02/10/2015 18:40   Echo 3/17 Impressions: - Inferolateral akinesis; inferior hypokinesis; overall mildly reduced LV  function (EF 45); grade 2 diastolic dysfunction; biatrial enlargement; mild MR.  Kathee Delton, MD 02/13/2015, 2:10 PM PGY-1, [facility] Family Medicine FPTS Intern pager: [phone], text pages welcome

## 2015-02-13 NOTE — Progress Notes (Addendum)
Paged IM Teaching MD to contact pt's [facility] for discharge medications about changes and additional medications for discharge  Keith Larson 4:15 PM 02/13/2015   MD acknowledged and talked to patient and family on the phone to address their concern  4:30 PM

## 2015-03-29 DEATH — deceased

## 2015-04-04 ENCOUNTER — Other Ambulatory Visit: Payer: Self-pay | Admitting: Family Medicine

## 2015-04-04 ENCOUNTER — Other Ambulatory Visit: Payer: Self-pay | Admitting: Obstetrics and Gynecology

## 2015-12-03 IMAGING — CR DG HIP (WITH OR WITHOUT PELVIS) 2-3V*L*
3 series · 3 of 3 positions shown · non-contrast
Comparison: None.

CLINICAL DATA: Status post fall from bed. Left hip pain. Initial
encounter.

EXAM:
LEFT HIP - COMPLETE 2+ VIEW

[t pelvis a.p.]
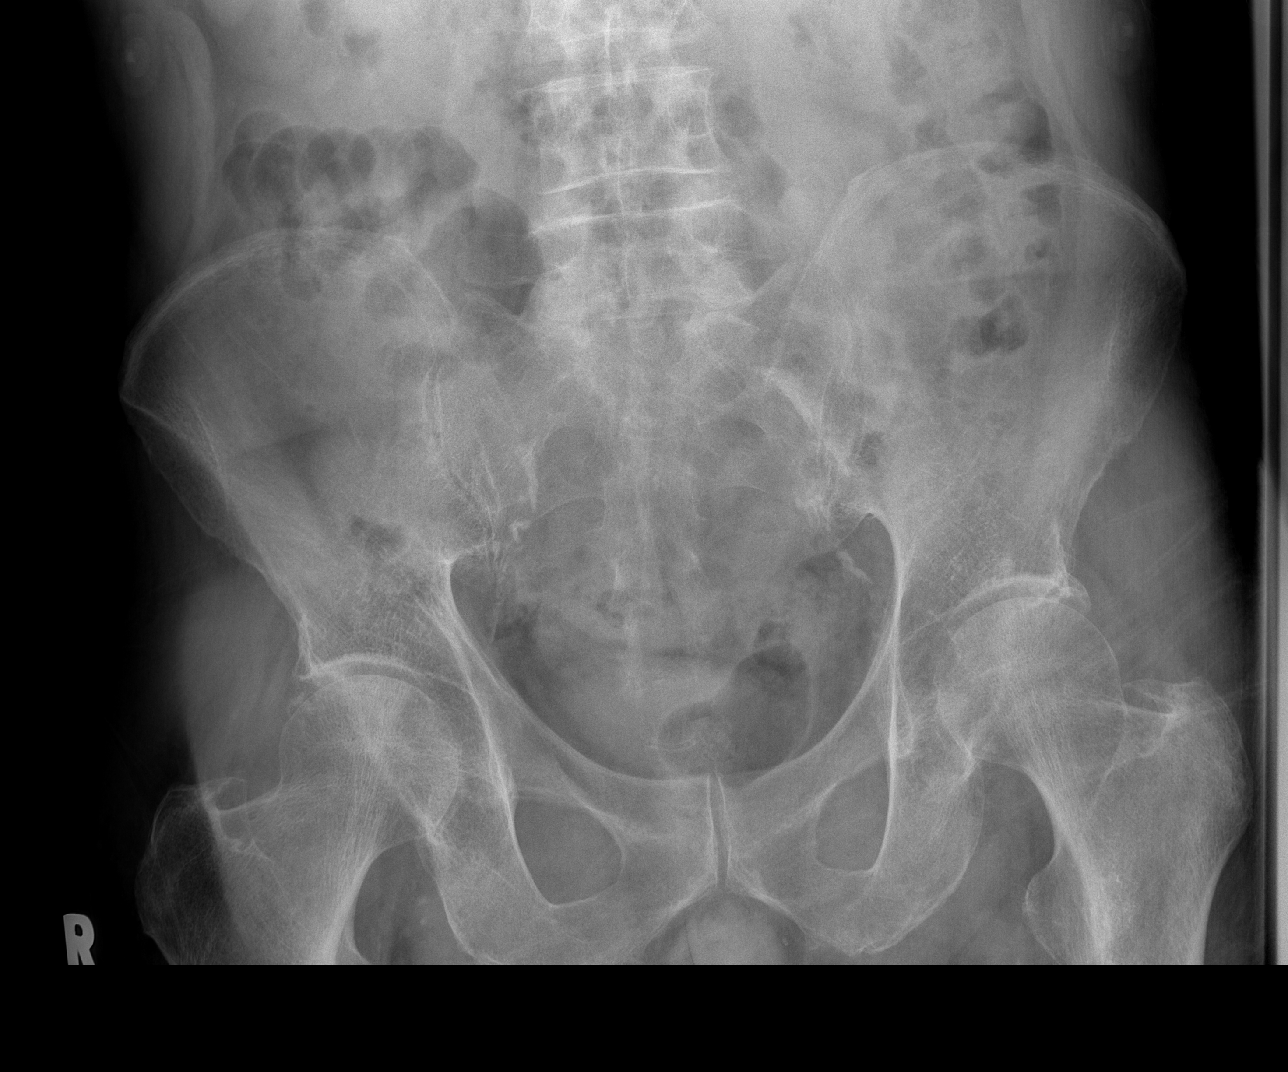

[t hip ap left]
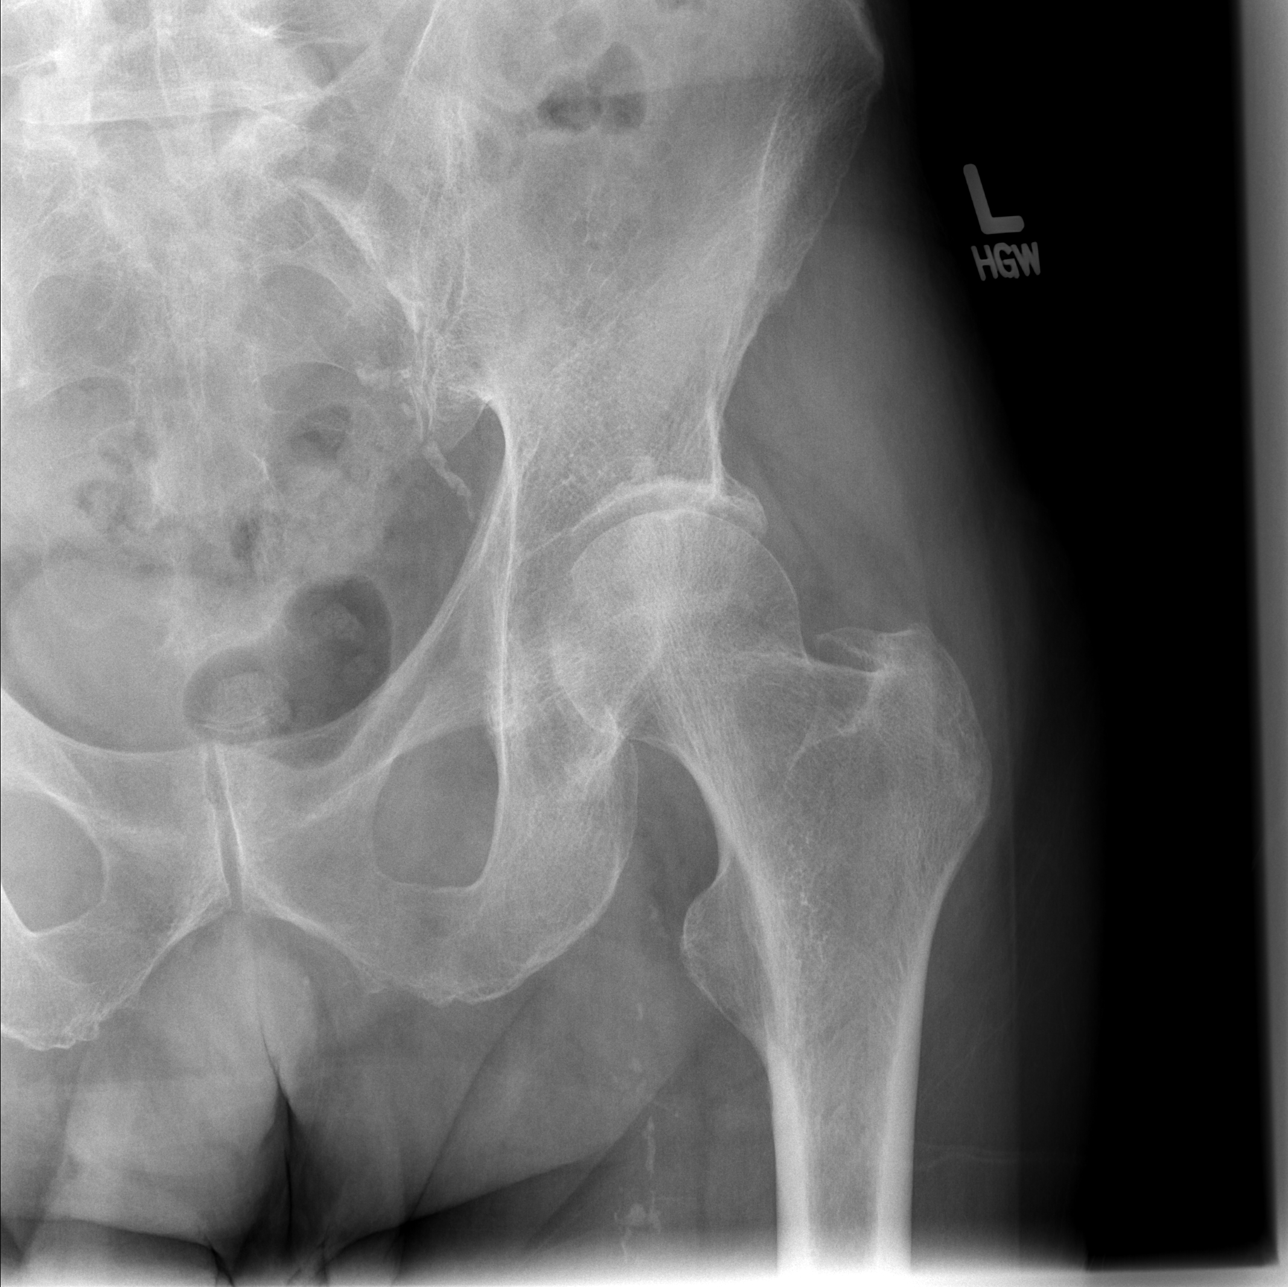

[t hip frog leg left]
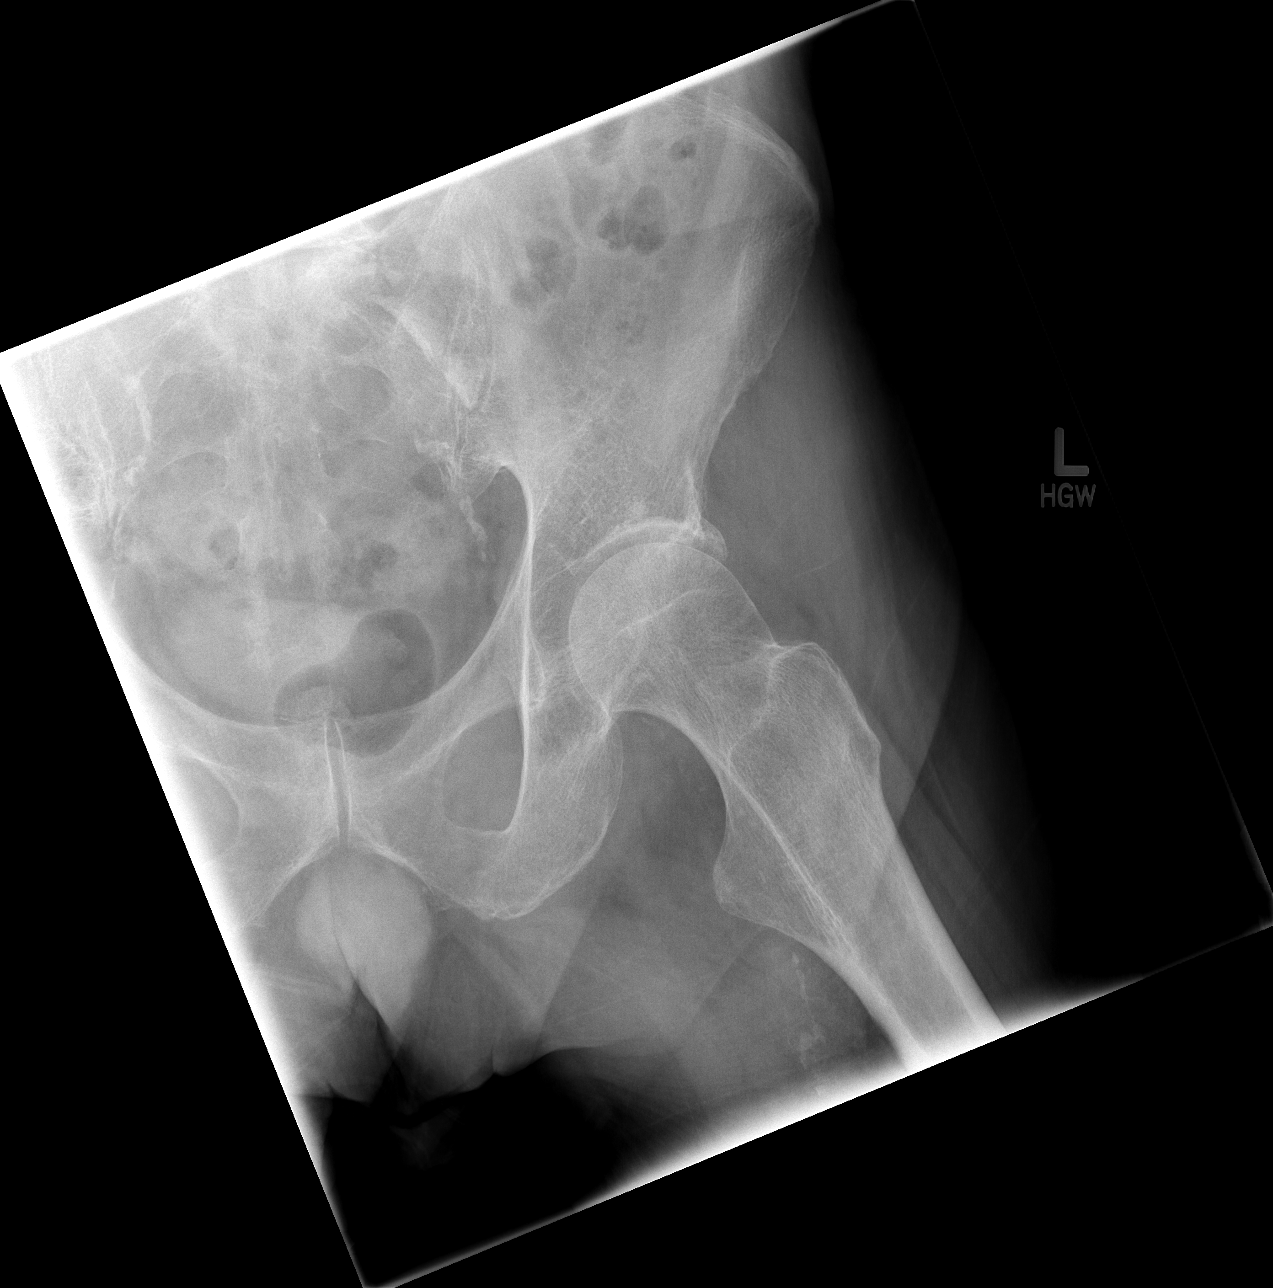

[3 of 3 positions shown; findings below may reference images not displayed]

FINDINGS: No acute bony or joint abnormality is identified. Mild degenerative
change is present about the hips. No focal bony lesion.
IMPRESSION: No acute abnormality.

## 2016-05-01 IMAGING — DX DG CHEST 2V
1 series · 1 of 1 positions shown · non-contrast
Comparison: Chest radiograph performed 09/13/2014

CLINICAL DATA: Acute onset of shortness of breath. Initial
encounter.

EXAM:
CHEST  2 VIEW

[chest lat]
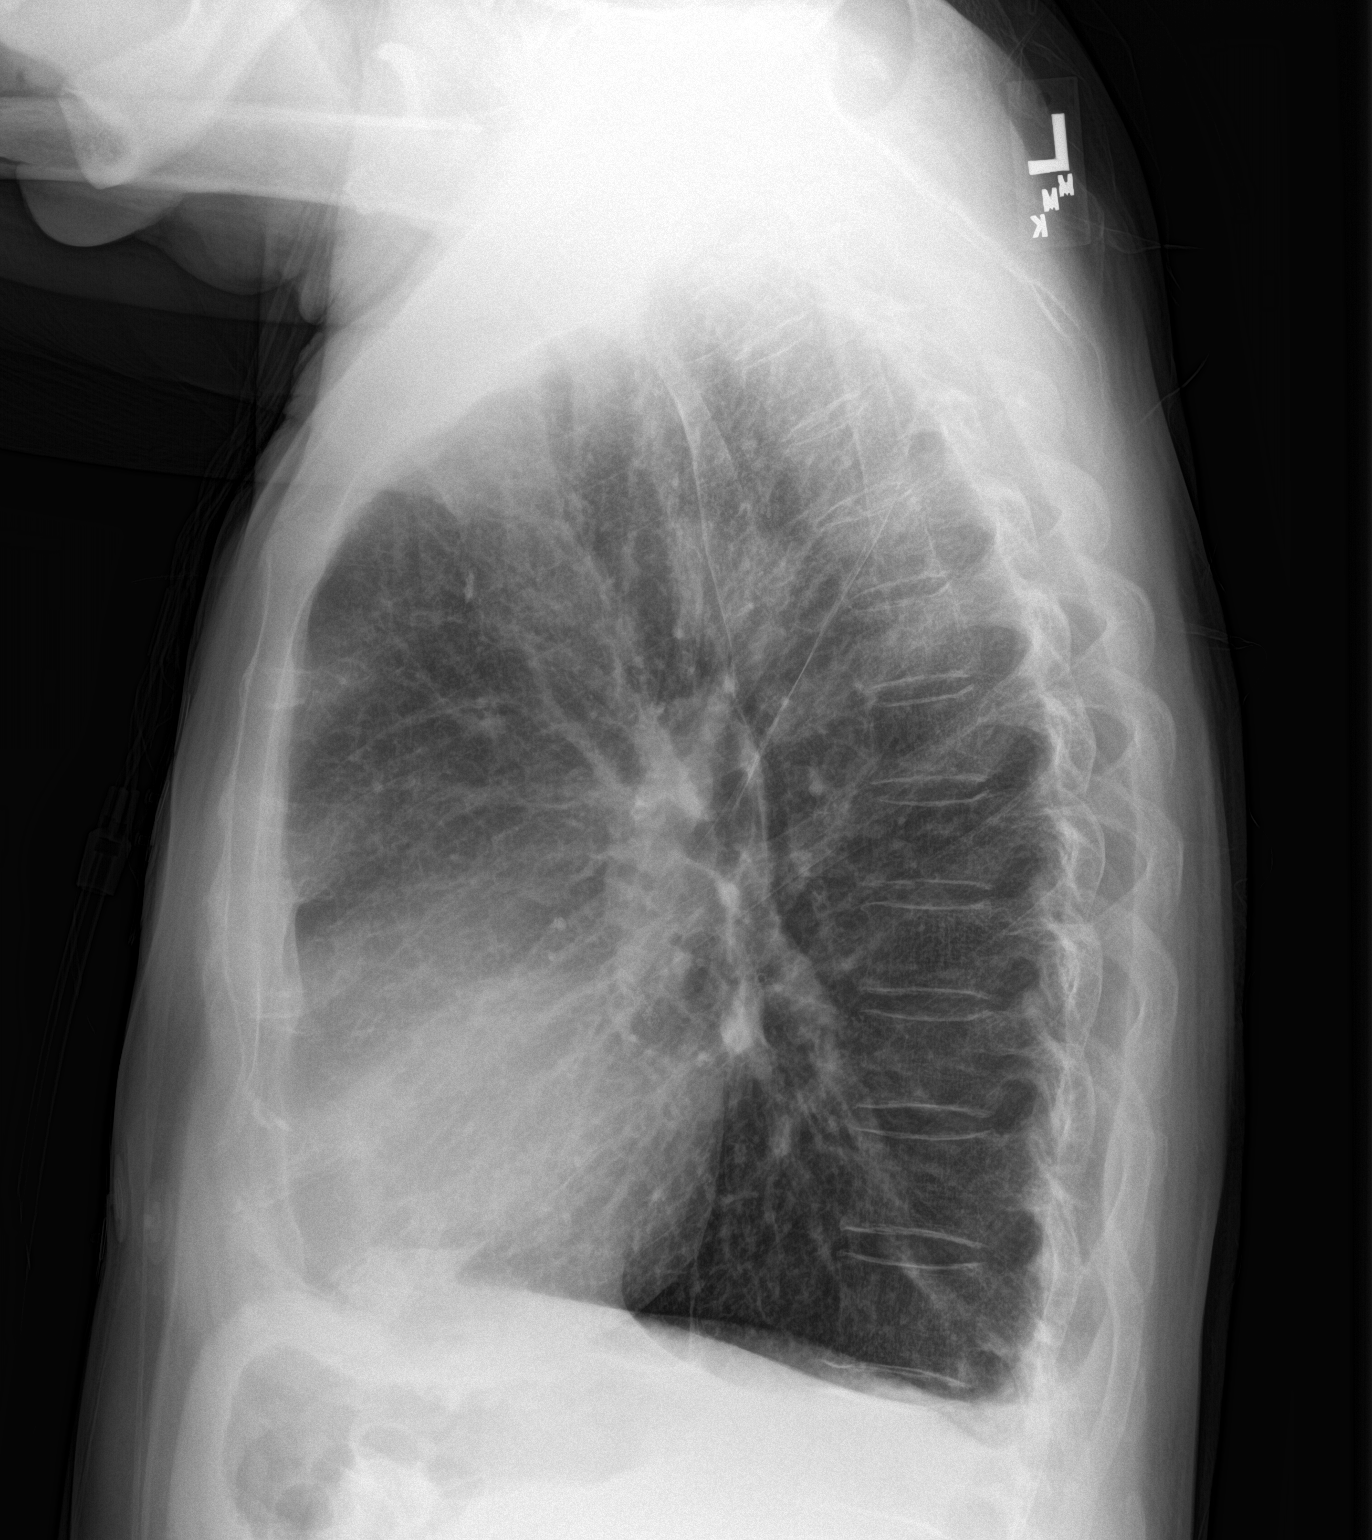

[1 of 1 positions shown; findings below may reference images not displayed]

FINDINGS: The lungs are hyperexpanded, with flattening of the hemidiaphragms,
compatible with COPD. Mild vascular congestion is noted. There is no
evidence of focal opacification, pleural effusion or pneumothorax.

The heart is borderline normal in size. No acute osseous
abnormalities are seen.
IMPRESSION: Mild vascular congestion noted, without definite pulmonary edema.
Findings of COPD.
